# Patient Record
Sex: Female | Born: 1955 | Race: White | Hispanic: No | Smoking: Never smoker
Health system: Southern US, Community
[De-identification: ages and names within clinical notes are randomized; demographics above are authoritative.]

## PROBLEM LIST (undated history)

## (undated) DIAGNOSIS — K565 Intestinal adhesions [bands], unspecified as to partial versus complete obstruction: Secondary | ICD-10-CM

## (undated) DIAGNOSIS — C539 Malignant neoplasm of cervix uteri, unspecified: Secondary | ICD-10-CM

## (undated) DIAGNOSIS — E039 Hypothyroidism, unspecified: Secondary | ICD-10-CM

## (undated) DIAGNOSIS — R112 Nausea with vomiting, unspecified: Secondary | ICD-10-CM

## (undated) DIAGNOSIS — I10 Essential (primary) hypertension: Secondary | ICD-10-CM

## (undated) DIAGNOSIS — Z9889 Other specified postprocedural states: Secondary | ICD-10-CM

## (undated) DIAGNOSIS — F32A Depression, unspecified: Secondary | ICD-10-CM

## (undated) DIAGNOSIS — K5792 Diverticulitis of intestine, part unspecified, without perforation or abscess without bleeding: Secondary | ICD-10-CM

## (undated) DIAGNOSIS — K219 Gastro-esophageal reflux disease without esophagitis: Secondary | ICD-10-CM

## (undated) DIAGNOSIS — K7689 Other specified diseases of liver: Secondary | ICD-10-CM

## (undated) DIAGNOSIS — S3992XA Unspecified injury of lower back, initial encounter: Secondary | ICD-10-CM

## (undated) DIAGNOSIS — M1990 Unspecified osteoarthritis, unspecified site: Secondary | ICD-10-CM

## (undated) DIAGNOSIS — F419 Anxiety disorder, unspecified: Secondary | ICD-10-CM

## (undated) DIAGNOSIS — B029 Zoster without complications: Secondary | ICD-10-CM

## (undated) DIAGNOSIS — J45909 Unspecified asthma, uncomplicated: Secondary | ICD-10-CM

## (undated) DIAGNOSIS — Z780 Asymptomatic menopausal state: Secondary | ICD-10-CM

## (undated) DIAGNOSIS — K819 Cholecystitis, unspecified: Secondary | ICD-10-CM

## (undated) DIAGNOSIS — K859 Acute pancreatitis without necrosis or infection, unspecified: Secondary | ICD-10-CM

## (undated) DIAGNOSIS — Z96649 Presence of unspecified artificial hip joint: Secondary | ICD-10-CM

## (undated) DIAGNOSIS — C801 Malignant (primary) neoplasm, unspecified: Secondary | ICD-10-CM

## (undated) HISTORY — DX: Gastro-esophageal reflux disease without esophagitis: K21.9

## (undated) HISTORY — PX: SHOULDER SURGERY: SHX246

## (undated) HISTORY — PX: AUGMENTATION MAMMAPLASTY: SUR837

## (undated) HISTORY — DX: Hypothyroidism, unspecified: E03.9

## (undated) HISTORY — DX: Cholecystitis, unspecified: K81.9

## (undated) HISTORY — PX: OOPHORECTOMY: SHX86

## (undated) HISTORY — PX: ABDOMINAL HYSTERECTOMY: SHX81

## (undated) HISTORY — PX: TUBAL LIGATION: SHX77

## (undated) HISTORY — DX: Asymptomatic menopausal state: Z78.0

## (undated) HISTORY — PX: TONSILLECTOMY: SUR1361

## (undated) HISTORY — PX: COLONOSCOPY: SHX174

## (undated) HISTORY — DX: Unspecified injury of lower back, initial encounter: S39.92XA

## (undated) HISTORY — DX: Acute pancreatitis without necrosis or infection, unspecified: K85.90

## (undated) HISTORY — PX: TOTAL HIP ARTHROPLASTY: SHX124

## (undated) HISTORY — DX: Other specified diseases of liver: K76.89

## (undated) HISTORY — PX: CERVICAL SPINE SURGERY: SHX589

## (undated) HISTORY — DX: Presence of unspecified artificial hip joint: Z96.649

## (undated) HISTORY — DX: Intestinal adhesions (bands), unspecified as to partial versus complete obstruction: K56.50

---
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
## 1993-05-10 HISTORY — PX: APPENDECTOMY: SHX54

## 2001-05-10 HISTORY — PX: THYROIDECTOMY: SHX17

## 2007-11-28 ENCOUNTER — Encounter: Admission: RE | Admit: 2007-11-28 | Discharge: 2007-11-28 | Payer: Self-pay | Admitting: Family Medicine

## 2007-12-04 ENCOUNTER — Encounter: Admission: RE | Admit: 2007-12-04 | Discharge: 2007-12-04 | Payer: Self-pay | Admitting: Family Medicine

## 2008-08-15 ENCOUNTER — Encounter: Admission: RE | Admit: 2008-08-15 | Discharge: 2008-08-15 | Payer: Self-pay | Admitting: Family Medicine

## 2010-05-31 ENCOUNTER — Encounter: Payer: Self-pay | Admitting: Family Medicine

## 2010-07-23 ENCOUNTER — Other Ambulatory Visit: Payer: Self-pay | Admitting: Family Medicine

## 2010-07-24 ENCOUNTER — Ambulatory Visit
Admission: RE | Admit: 2010-07-24 | Discharge: 2010-07-24 | Disposition: A | Discharge status: Home / Self Care | Payer: Managed Care, Other (non HMO) | Source: Ambulatory Visit | Attending: Family Medicine | Admitting: Family Medicine

## 2010-07-24 DIAGNOSIS — K7689 Other specified diseases of liver: Secondary | ICD-10-CM

## 2010-07-24 HISTORY — DX: Other specified diseases of liver: K76.89

## 2010-10-09 ENCOUNTER — Other Ambulatory Visit: Payer: Self-pay | Admitting: Obstetrics and Gynecology

## 2010-10-09 DIAGNOSIS — N6320 Unspecified lump in the left breast, unspecified quadrant: Secondary | ICD-10-CM

## 2010-10-19 ENCOUNTER — Other Ambulatory Visit: Payer: Self-pay | Admitting: Obstetrics and Gynecology

## 2010-10-19 ENCOUNTER — Ambulatory Visit
Admission: RE | Admit: 2010-10-19 | Discharge: 2010-10-19 | Disposition: A | Discharge status: Home / Self Care | Payer: Managed Care, Other (non HMO) | Source: Ambulatory Visit | Attending: Obstetrics and Gynecology | Admitting: Obstetrics and Gynecology

## 2010-10-19 DIAGNOSIS — N6320 Unspecified lump in the left breast, unspecified quadrant: Secondary | ICD-10-CM

## 2011-01-15 ENCOUNTER — Ambulatory Visit: Payer: Managed Care, Other (non HMO) | Admitting: Nurse Practitioner

## 2011-01-15 ENCOUNTER — Telehealth: Payer: Self-pay | Admitting: Internal Medicine

## 2011-01-15 ENCOUNTER — Encounter: Payer: Self-pay | Admitting: *Deleted

## 2011-01-15 NOTE — Telephone Encounter (Signed)
Left a message for patient to call me. 

## 2011-01-15 NOTE — Telephone Encounter (Signed)
Spoke with patient and offered her an appointment with and extender. She does not want to do this. States she will wait for Dr. Juanda Chance. Scheduled patient on 03/03/11 at 10:15 AM. Letter mailed

## 2011-03-03 ENCOUNTER — Ambulatory Visit (INDEPENDENT_AMBULATORY_CARE_PROVIDER_SITE_OTHER): Payer: Managed Care, Other (non HMO) | Admitting: Internal Medicine

## 2011-03-03 ENCOUNTER — Encounter: Payer: Self-pay | Admitting: Internal Medicine

## 2011-03-03 VITALS — BP 100/70 | HR 68 | Ht 62.0 in | Wt 131.6 lb

## 2011-03-03 DIAGNOSIS — R1013 Epigastric pain: Secondary | ICD-10-CM

## 2011-03-03 NOTE — Progress Notes (Signed)
[name] 07-Feb-1956 MRN [id number]   History of Present Illness:  This is a 55 year old white female with intermittent epigastric pain which may be very severe at times, lasting for several hours. It may be partly relieved by drinking cold water or taking Mylanta. She has started taking antioxidants called Photogreen ,made by [facility], which has been effective in complete relief of her abdominal pain. She has been on it now for 6 weeks with complete resolution of the epigastric pain. She still wants to know why she was having abdominal pain. She tried Nexium 40 mg a day for several weeks without improvement. She doesn't smoking,  Drinking  Alcohol, she drinks about 4 cups of coffee a day. She had recent colonoscopy  which was negative. She had upper abdominal ultrasound in March 2012 which showed small liver cyst and CT scan of the abdomen and July 2009 was normal.    Past Medical History  Diagnosis Date  . Hypothyroidism   . Menopause   . GERD (gastroesophageal reflux disease)   . 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   Past Surgical History  Procedure Date  . Thyroidectomy 2003  . Cesarean section 2001  . Appendectomy 1995  . Oophorectomy 1970's    left  . Abdominal adhesion surgery 1972    reports that she has never smoked. She has never used smokeless tobacco. She reports that she does not drink alcohol or use illicit drugs. family history includes Alcohol abuse in her father; Colitis in her father; Lung cancer in her father; and Stroke in her mother.  There is no history of Colon cancer. Allergies  Allergen Reactions  . Percocet (Oxycodone-Acetaminophen)   . Sulfa Antibiotics         Review of the night heartburn, dysphagia odynophagia chest pain  The remainder of the 10 point ROS is negative except as outlined in H&P   Physical Exam: General appearance  Well developed, in no distress. Eyes- non icteric. HEENT nontraumatic, normocephalic. Mouth no lesions, tongue papillated, no  cheilosis. Neck supple without adenopathy, thyroid not enlarged, no carotid bruits, no JVD. Lungs Clear to auscultation bilaterally. Cor normal S1, normal S2, regular rhythm, no murmur,  quiet precordium. Abdomen: Soft scaphoid abdomen with normal active bowel sounds and mild tenderness in epigastrium there is no CVA tenderness. Liver edge is at the costal margin. Rectal: Not done. Extremities no pedal edema. Skin no lesions. Neurological alert and oriented x 3. Psychological normal mood and affect.  Assessment and Plan:  Problem #1 Intermittent epigastric pain sounds peptic. It is not relieved by Nexium but relieved by Mylanta and antioxidants. I am not sure of the relationship between antioxidants and gastritis. She would like to proceed with a diagnostic upper endoscopy to rule out H. pylori, hiatal hernia or peptic ulcer disease. I have asked her to reduce her caffeine intake.  03/03/2011 [name]

## 2011-03-03 NOTE — Patient Instructions (Addendum)
You have been scheduled for an endoscopy without sedation. Please follow written instructions given to you at your visit today.  CC: Dr Warrick Parisian

## 2011-03-04 ENCOUNTER — Encounter: Payer: Self-pay | Admitting: Internal Medicine

## 2011-03-18 ENCOUNTER — Other Ambulatory Visit: Payer: Managed Care, Other (non HMO) | Admitting: Internal Medicine

## 2011-03-24 ENCOUNTER — Other Ambulatory Visit: Payer: Managed Care, Other (non HMO) | Admitting: Internal Medicine

## 2011-08-10 DIAGNOSIS — M19049 Primary osteoarthritis, unspecified hand: Secondary | ICD-10-CM | POA: Insufficient documentation

## 2012-02-23 ENCOUNTER — Other Ambulatory Visit: Payer: Self-pay | Admitting: Orthopedic Surgery

## 2012-02-24 ENCOUNTER — Encounter (HOSPITAL_BASED_OUTPATIENT_CLINIC_OR_DEPARTMENT_OTHER): Payer: Self-pay | Admitting: *Deleted

## 2012-02-24 NOTE — Progress Notes (Signed)
Working-will need ekg and bmet May try to come today-or do in am

## 2012-02-24 NOTE — H&P (Signed)
  HPI: Patient presents with a chief complaint of left knee pain and back pain.  The left knee pain began a few months ago when she was reaching forward in a lunge position.  She localizes his pain to the medial side and complains of associated clicking.  She had right knee arthroscopy a couple of years ago for a medial meniscal tear and reports that her symptoms feel the same.  She has tried oral anti-inflammatories with minimal improvement.  She has had several cortisone injections but had bleeding for approximately 3 months afterwards and was advised not to use cortisone anymore. Her back pain began approximately one month ago and has associated popping.  She sustained a T11 compression fracture in 2006 and has known degenerative disc disease and back.  She localizes her pain to the left side of her mid back.  She denies any numbness, tingling or burning in her upper or lower extremities.  All: Percocet, morphine, anesthesia  ROS: 14 point review of systems form filled out by the patient was reviewed and was negative as it relates to the history of present illness except for: Glasses  PMH: Status post tonsillectomy, adenoidectomy, appendectomy, bilateral knee surgeries   FHx: Arthritis  SocHx:  She denies use of tobacco but drinks occasional alcohol.  She works full-time for [employer] company  PE: Well-developed, well-nourished 56 year old female who is alert and oriented and in no acute distress.  She is 5 feet 1 inch tall and weighs 130 pounds.  Exam of the left knee demonstrates tenderness to palpation along the medial joint line and pain with full extension.  McMurray's test is negative.  There is no obvious effusion. Exam of the back demonstrates tenderness to palpation just inferior to the scapula on the left side.  No palpable popping or swelling on exam.  She is neurovascularly intact.  Imaging/Tests: 2 views of the thoracic spine demonstrate an old compression  fracture at T11 multilevel degenerative disc disease.  She has scoliosis.  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 #2 thoracic back pain  Plan:  We have given [name] a prescription for formal physical therapy for the back.  She would like to proceed with a left knee arthroscopy.  Risks and benefits of surgery were discussed and are well known to the patient.  We also discussed a DEXA scan, which she will schedule at her convenience.

## 2012-02-25 ENCOUNTER — Encounter (HOSPITAL_BASED_OUTPATIENT_CLINIC_OR_DEPARTMENT_OTHER)
Admission: RE | Disposition: A | Discharge status: Home / Self Care | Payer: Self-pay | Source: Ambulatory Visit | Attending: Orthopedic Surgery

## 2012-02-25 ENCOUNTER — Encounter (HOSPITAL_BASED_OUTPATIENT_CLINIC_OR_DEPARTMENT_OTHER): Payer: Self-pay | Admitting: Anesthesiology

## 2012-02-25 ENCOUNTER — Encounter (HOSPITAL_BASED_OUTPATIENT_CLINIC_OR_DEPARTMENT_OTHER): Payer: Self-pay

## 2012-02-25 ENCOUNTER — Ambulatory Visit (HOSPITAL_BASED_OUTPATIENT_CLINIC_OR_DEPARTMENT_OTHER): Payer: Managed Care, Other (non HMO) | Admitting: Anesthesiology

## 2012-02-25 ENCOUNTER — Ambulatory Visit (HOSPITAL_BASED_OUTPATIENT_CLINIC_OR_DEPARTMENT_OTHER)
Admission: RE | Admit: 2012-02-25 | Discharge: 2012-02-25 | Disposition: A | Discharge status: Home / Self Care | Payer: Managed Care, Other (non HMO) | Source: Ambulatory Visit | Attending: Orthopedic Surgery | Admitting: Orthopedic Surgery

## 2012-02-25 DIAGNOSIS — M23305 Other meniscus derangements, unspecified medial meniscus, unspecified knee: Secondary | ICD-10-CM | POA: Insufficient documentation

## 2012-02-25 DIAGNOSIS — M2240 Chondromalacia patellae, unspecified knee: Secondary | ICD-10-CM | POA: Insufficient documentation

## 2012-02-25 DIAGNOSIS — S83209A Unspecified tear of unspecified meniscus, current injury, unspecified knee, initial encounter: Secondary | ICD-10-CM

## 2012-02-25 HISTORY — DX: Other specified postprocedural states: Z98.890

## 2012-02-25 HISTORY — DX: Other specified postprocedural states: R11.2

## 2012-02-25 LAB — POCT I-STAT, CHEM 8
Calcium, Ion: 1.02 mmol/L — ABNORMAL LOW (ref 1.12–1.23)
Chloride: 102 mEq/L (ref 96–112)
Creatinine, Ser: 0.9 mg/dL (ref 0.50–1.10)
Glucose, Bld: 89 mg/dL (ref 70–99)
Potassium: 3.5 mEq/L (ref 3.5–5.1)

## 2012-02-25 SURGERY — ARTHROSCOPY, KNEE, WITH MEDIAL MENISCECTOMY
Anesthesia: General | Site: Knee | Laterality: Left | Wound class: Clean

## 2012-02-25 MED ORDER — CEFAZOLIN SODIUM-DEXTROSE 2-3 GM-% IV SOLR
2.0000 g | INTRAVENOUS | Status: AC
  Administered 2012-02-25: 2 g via INTRAVENOUS

## 2012-02-25 MED ORDER — DEXAMETHASONE SODIUM PHOSPHATE 4 MG/ML IJ SOLN
INTRAMUSCULAR | Status: DC | PRN
  Administered 2012-02-25: 10 mg via INTRAVENOUS

## 2012-02-25 MED ORDER — SODIUM CHLORIDE 0.9 % IR SOLN: Status: DC | PRN

## 2012-02-25 MED ORDER — FENTANYL CITRATE 0.05 MG/ML IJ SOLN
INTRAMUSCULAR | Status: DC | PRN
  Administered 2012-02-25 (×2): 50 ug via INTRAVENOUS

## 2012-02-25 MED ORDER — PROPOFOL INFUSION 10 MG/ML OPTIME
INTRAVENOUS | Status: DC | PRN
  Administered 2012-02-25: 180 ug/kg/min via INTRAVENOUS

## 2012-02-25 MED ORDER — SODIUM CHLORIDE 0.9 % IR SOLN
Status: DC | PRN
  Administered 2012-02-25: 09:00:00

## 2012-02-25 MED ORDER — CHLORHEXIDINE GLUCONATE 4 % EX LIQD: 60.0000 mL | Freq: Once | CUTANEOUS | Status: DC

## 2012-02-25 MED ORDER — SCOPOLAMINE 1 MG/3DAYS TD PT72
1.0000 | MEDICATED_PATCH | Freq: Once | TRANSDERMAL | Status: DC
  Administered 2012-02-25: 1.5 mg via TRANSDERMAL

## 2012-02-25 MED ORDER — PHENYLEPHRINE HCL 10 MG/ML IJ SOLN
INTRAMUSCULAR | Status: DC | PRN
  Administered 2012-02-25: 40 ug via INTRAVENOUS

## 2012-02-25 MED ORDER — FENTANYL CITRATE 0.05 MG/ML IJ SOLN: INTRAMUSCULAR | Status: DC | PRN

## 2012-02-25 MED ORDER — SCOPOLAMINE 1 MG/3DAYS TD PT72
MEDICATED_PATCH | TRANSDERMAL | Status: DC | PRN
  Administered 2012-02-25: 1 via TRANSDERMAL

## 2012-02-25 MED ORDER — LACTATED RINGERS IV SOLN
INTRAVENOUS | Status: DC
  Administered 2012-02-25 (×2): via INTRAVENOUS

## 2012-02-25 MED ORDER — LIDOCAINE HCL (CARDIAC) 20 MG/ML IV SOLN
INTRAVENOUS | Status: DC | PRN
  Administered 2012-02-25: 75 mg via INTRAVENOUS

## 2012-02-25 MED ORDER — PROPOFOL 10 MG/ML IV BOLUS
INTRAVENOUS | Status: DC | PRN
  Administered 2012-02-25: 200 mg via INTRAVENOUS

## 2012-02-25 MED ORDER — ACETAMINOPHEN 10 MG/ML IV SOLN
1000.0000 mg | Freq: Once | INTRAVENOUS | Status: AC
  Administered 2012-02-25: 1000 mg via INTRAVENOUS

## 2012-02-25 MED ORDER — DEXTROSE-NACL 5-0.45 % IV SOLN: INTRAVENOUS | Status: DC

## 2012-02-25 MED ORDER — BUPIVACAINE-EPINEPHRINE 0.5% -1:200000 IJ SOLN
INTRAMUSCULAR | Status: DC | PRN
  Administered 2012-02-25: 20 mL

## 2012-02-25 MED ORDER — MIDAZOLAM HCL 5 MG/5ML IJ SOLN
INTRAMUSCULAR | Status: DC | PRN
  Administered 2012-02-25: 2 mg via INTRAVENOUS

## 2012-02-25 MED ORDER — TRAMADOL HCL 50 MG PO TABS: 50.0000 mg | ORAL_TABLET | ORAL | Status: DC | PRN

## 2012-02-25 MED ORDER — ACETAMINOPHEN 10 MG/ML IV SOLN
INTRAVENOUS | Status: DC | PRN
  Administered 2012-02-25: 1000 mg via INTRAVENOUS

## 2012-02-25 MED ORDER — ONDANSETRON HCL 4 MG/2ML IJ SOLN
INTRAMUSCULAR | Status: DC | PRN
  Administered 2012-02-25: 4 mg via INTRAVENOUS

## 2012-02-25 MED ORDER — MIDAZOLAM HCL 5 MG/5ML IJ SOLN: INTRAMUSCULAR | Status: DC | PRN

## 2012-02-25 SURGICAL SUPPLY — 38 items
BANDAGE ELASTIC 6 VELCRO ST LF (GAUZE/BANDAGES/DRESSINGS) ×2 IMPLANT
BLADE 4.2CUDA (BLADE) IMPLANT
BLADE CUTTER GATOR 3.5 (BLADE) ×2 IMPLANT
BLADE GREAT WHITE 4.2 (BLADE) IMPLANT
CANISTER OMNI JUG 16 LITER (MISCELLANEOUS) ×2 IMPLANT
CANISTER SUCTION 2500CC (MISCELLANEOUS) IMPLANT
CHLORAPREP W/TINT 26ML (MISCELLANEOUS) ×2 IMPLANT
CLOTH BEACON ORANGE TIMEOUT ST (SAFETY) ×2 IMPLANT
DRAPE ARTHROSCOPY W/POUCH 114 (DRAPES) ×2 IMPLANT
ELECT MENISCUS 165MM 90D (ELECTRODE) IMPLANT
ELECT REM PT RETURN 9FT ADLT (ELECTROSURGICAL)
ELECTRODE REM PT RTRN 9FT ADLT (ELECTROSURGICAL) IMPLANT
GAUZE XEROFORM 1X8 LF (GAUZE/BANDAGES/DRESSINGS) ×2 IMPLANT
GLOVE BIO SURGEON STRL SZ7 (GLOVE) ×2 IMPLANT
GLOVE BIO SURGEON STRL SZ7.5 (GLOVE) ×2 IMPLANT
GLOVE BIOGEL PI IND STRL 7.0 (GLOVE) ×2 IMPLANT
GLOVE BIOGEL PI IND STRL 8 (GLOVE) ×1 IMPLANT
GLOVE BIOGEL PI INDICATOR 7.0 (GLOVE) ×2
GLOVE BIOGEL PI INDICATOR 8 (GLOVE) ×1
GLOVE ECLIPSE 6.5 STRL STRAW (GLOVE) ×2 IMPLANT
GOWN PREVENTION PLUS XLARGE (GOWN DISPOSABLE) ×4 IMPLANT
KNEE WRAP E Z 3 GEL PACK (MISCELLANEOUS) ×2 IMPLANT
NDL SAFETY ECLIPSE 18X1.5 (NEEDLE) ×1 IMPLANT
NEEDLE FILTER BLUNT 18X 1/2SAF (NEEDLE)
NEEDLE FILTER BLUNT 18X1 1/2 (NEEDLE) IMPLANT
NEEDLE HYPO 18GX1.5 SHARP (NEEDLE) ×1
PACK ARTHROSCOPY DSU (CUSTOM PROCEDURE TRAY) ×2 IMPLANT
PACK BASIN DAY SURGERY FS (CUSTOM PROCEDURE TRAY) ×2 IMPLANT
PENCIL BUTTON HOLSTER BLD 10FT (ELECTRODE) IMPLANT
SET ARTHROSCOPY TUBING (MISCELLANEOUS) ×1
SET ARTHROSCOPY TUBING LN (MISCELLANEOUS) ×1 IMPLANT
SLEEVE SCD COMPRESS KNEE MED (MISCELLANEOUS) IMPLANT
SPONGE GAUZE 4X4 12PLY (GAUZE/BANDAGES/DRESSINGS) ×2 IMPLANT
SYR 3ML 18GX1 1/2 (SYRINGE) ×2 IMPLANT
SYR 5ML LL (SYRINGE) ×2 IMPLANT
TOWEL OR 17X24 6PK STRL BLUE (TOWEL DISPOSABLE) ×2 IMPLANT
WAND STAR VAC 90 (SURGICAL WAND) IMPLANT
WATER STERILE IRR 1000ML POUR (IV SOLUTION) ×2 IMPLANT

## 2012-02-25 NOTE — Anesthesia Postprocedure Evaluation (Signed)
Anesthesia Post Note  Patient: [name]  Procedure(s) Performed: Procedure(s) (LRB): KNEE ARTHROSCOPY WITH MEDIAL MENISECTOMY (Left)  Anesthesia type: General  Patient location: PACU  Post pain: Pain level controlled and Adequate analgesia  Post assessment: Post-op Vital signs reviewed, Patient's Cardiovascular Status Stable, Respiratory Function Stable, Patent Airway and Pain level controlled  Last Vitals:  Filed Vitals:   02/25/12 1015  BP:   Pulse: 76  Temp:   Resp: 18    Post vital signs: Reviewed and stable  Level of consciousness: awake, alert  and oriented  Complications: No apparent anesthesia complications

## 2012-02-25 NOTE — Anesthesia Preprocedure Evaluation (Signed)
Anesthesia Evaluation  Patient identified by MRN, date of birth, ID band Patient awake    Reviewed: Allergy & Precautions, H&P , NPO status , Patient's Chart, lab work & pertinent test results  History of Anesthesia Complications (+) PONV  Airway Mallampati: II  Neck ROM: full    Dental   Pulmonary          Cardiovascular     Neuro/Psych    GI/Hepatic GERD-  ,  Endo/Other  Hypothyroidism   Renal/GU      Musculoskeletal   Abdominal   Peds  Hematology   Anesthesia Other Findings   Reproductive/Obstetrics                           Anesthesia Physical Anesthesia Plan  ASA: II  Anesthesia Plan: General   Post-op Pain Management:    Induction: Intravenous  Airway Management Planned: LMA  Additional Equipment:   Intra-op Plan:   Post-operative Plan:   Informed Consent: I have reviewed the patients History and Physical, chart, labs and discussed the procedure including the risks, benefits and alternatives for the proposed anesthesia with the patient or authorized representative who has indicated his/her understanding and acceptance.     Plan Discussed with: CRNA and Surgeon  Anesthesia Plan Comments:         Anesthesia Quick Evaluation

## 2012-02-25 NOTE — Anesthesia Procedure Notes (Signed)
Procedure Name: LMA Insertion Date/Time: 02/25/2012 9:05 AM Performed by: Zenia Resides D Pre-anesthesia Checklist: Patient identified, Emergency Drugs available, Suction available, Patient being monitored and Timeout performed Patient Re-evaluated:Patient Re-evaluated prior to inductionOxygen Delivery Method: Circle System Utilized Preoxygenation: Pre-oxygenation with 100% oxygen Intubation Type: IV induction Ventilation: Mask ventilation without difficulty LMA: LMA inserted LMA Size: 4.0 Number of attempts: 1 Airway Equipment and Method: bite block Placement Confirmation: positive ETCO2 and breath sounds checked- equal and bilateral Tube secured with: Tape Dental Injury: Teeth and Oropharynx as per pre-operative assessment

## 2012-02-25 NOTE — Op Note (Signed)
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  Surgeon: Feliberto Gottron. Turner Daniels M.D.  Assist: Shirl Harris PA-C  Anes: General LMA  EBL: Minimal  Fluids: 800 cc   Indications: Greater than three-month history of catching popping and pain to the medial aspect of the left knee. Has occasional near locking episodes and sometimes the pain wakes her at night when she is in bed.. Pt has failed conservative treatment with anti-inflammatory medicines, physical therapy, and modified activites but did get good temporarily from an intra-articular cortisone injection. Pain has recurred and patient desires elective arthroscopic evaluation and treatment of knee. Risks and benefits of surgery have been discussed and questions answered.  Procedure: Patient identified by arm band and taken to the operating room at the day surgery Center. The appropriate anesthetic monitors were attached, and General LMA anesthesia was induced without difficulty. Lateral post was applied to the table and the lower extremity was prepped and draped in usual sterile fashion from the ankle to the midthigh. Time out procedure was performed. We began the operation by making standard inferior lateral and inferior medial peripatellar portals with a #11 blade allowing introduction of the arthroscope through the inferior lateral portal and the out flow to the inferior medial portal. Pump pressure was set at 100 mmHg and diagnostic arthroscopy  revealed focal grade 4 chondromalacia to the lateral facet of the patella, grade 3 chondromalacia with flap tears and focal grade 4 chondromalacia the trochlea and the anterior flange of the medial femoral condyle. These areas are debridement back to a stable  margin with a straight biter large and small and a 35 Gator sucker shaver. Moving into the medial compartment the patient complex degenerative tearing of the medial meniscus is debrider back to a stable margin again with a 35 Gator sucker shaver the anterior cruciate ligament and PCL were noted to be intact. The lateral compartment was in excellent condition with normal-appearing articular and meniscal cartilages. The gutters were cleared medially and laterally. The knee was irrigated out normal saline solution. A dressing of xerofoam 4 x 4 dressing sponges, web roll and an Ace wrap was applied. The patient was awakened extubated and taken to the recovery without difficulty.    Signed: Nestor Lewandowsky, MD

## 2012-02-25 NOTE — Interval H&P Note (Signed)
History and Physical Interval Note:  02/25/2012 8:35 AM  [name]  has presented today for surgery, with the diagnosis of LEFT KNEE MEDIAL MENSICAL TEAR AND CHONDROMALACIA  The various methods of treatment have been discussed with the patient and family. After consideration of risks, benefits and other options for treatment, the patient has consented to  Procedure(s) (LRB) with comments: KNEE ARTHROSCOPY WITH MEDIAL MENISECTOMY (Left) - LEFT KNEE ARTHROSCOPY WITH PARTIAL MEDIAL MENISECTOMY, DEBRIDEMENT OF CHONDROMALACIA as a surgical intervention .  The patient's history has been reviewed, patient examined, no change in status, stable for surgery.  I have reviewed the patient's chart and labs.  Questions were answered to the patient's satisfaction.     Nestor Lewandowsky

## 2012-02-25 NOTE — Transfer of Care (Signed)
Immediate Anesthesia Transfer of Care Note  Patient: [name]  Procedure(s) Performed: Procedure(s) (LRB) with comments: KNEE ARTHROSCOPY WITH MEDIAL MENISECTOMY (Left) - LEFT KNEE ARTHROSCOPY WITH PARTIAL MEDIAL MENISECTOMY, DEBRIDEMENT OF CHONDROMALACIA  Patient Location: PACU  Anesthesia Type: General  Level of Consciousness: awake  Airway & Oxygen Therapy: Patient Spontanous Breathing and Patient connected to face mask oxygen  Post-op Assessment: Report given to PACU RN and Post -op Vital signs reviewed and stable  Post vital signs: Reviewed and stable  Complications: No apparent anesthesia complications

## 2012-09-04 ENCOUNTER — Ambulatory Visit: Payer: BC Managed Care – PPO

## 2012-09-04 ENCOUNTER — Ambulatory Visit (INDEPENDENT_AMBULATORY_CARE_PROVIDER_SITE_OTHER): Payer: BC Managed Care – PPO | Admitting: Family Medicine

## 2012-09-04 VITALS — BP 118/78 | HR 83 | Temp 98.5°F | Resp 16 | Ht 62.0 in | Wt 140.4 lb

## 2012-09-04 DIAGNOSIS — S8990XA Unspecified injury of unspecified lower leg, initial encounter: Secondary | ICD-10-CM

## 2012-09-04 DIAGNOSIS — S99912A Unspecified injury of left ankle, initial encounter: Secondary | ICD-10-CM

## 2012-09-04 DIAGNOSIS — S99929A Unspecified injury of unspecified foot, initial encounter: Secondary | ICD-10-CM

## 2012-09-04 NOTE — Progress Notes (Signed)
[facility] [address], [place] [address] [phone]  Date:  09/04/2012   Name:  [name]   DOB:  02/24/1956   MRN:  [id number]  PCP:  Warrick Parisian, MD    Chief Complaint: left ankle pain   History of Present Illness:  [name] is a 57 y.o. very pleasant female patient who presents with the following:  She is here today to evaluate a left foot injury which occurred on Saturday- today is Monday. She had to stop suddenly while on a zip line and pushed off a tree with the left foot.   She had immediate pain, and could not walk on the ankle.  She is now able to walk but is still having pain, as well as bruising and swelling. She had some crutches at home which she is using, but she lost the underarm pad from one crutch so now cannot use them.   She is generally healthy She is through menopause, no chance of pregnancy per her report  There is no problem list on file for this patient.   Past Medical History  Diagnosis Date  . Hypothyroidism   . Menopause   . GERD (gastroesophageal reflux disease)   . 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 PONV (postoperative nausea and vomiting)     Past Surgical History  Procedure Laterality Date  . Thyroidectomy  2003  . Cesarean section  2001  . Appendectomy  1995  . Oophorectomy  1970's    left  . Abdominal adhesion surgery  1972  . Tonsillectomy    . Colonoscopy      History  Substance Use Topics  . Smoking status: Never Smoker   . Smokeless tobacco: Never Used  . Alcohol Use: No     Comment: wine socially    Family History  Problem Relation Age of Onset  . Lung cancer Father   . Colitis Father   . Alcohol abuse Father   . Stroke Mother   . Colon cancer Neg Hx     Allergies  Allergen Reactions  . Codeine Hives  . Hydrocodone Nausea And Vomiting  . Percocet (Oxycodone-Acetaminophen)   . Prednisone     Causes her to have vaginal bleeding  . Sulfa Antibiotics     Medication list  has been reviewed and updated.  Current Outpatient Prescriptions on File Prior to Visit  Medication Sig Dispense Refill  . estradiol (VIVELLE-DOT) 0.025 MG/24HR Place 1 patch onto the skin 2 (two) times a week.      . losartan (COZAAR) 50 MG tablet Take 100 mg by mouth daily. Take half tablet(50mg  daily)      . thyroid (ARMOUR) 120 MG tablet Take 120 mg by mouth daily.      Marland Kitchen esomeprazole (NEXIUM) 40 MG capsule Take 40 mg by mouth daily before breakfast.      . psyllium (METAMUCIL) 58.6 % packet Take 1 packet by mouth daily.        . traMADol (ULTRAM) 50 MG tablet Take 1-2 tablets (50-100 mg total) by mouth every 4 (four) hours as needed for pain.  60 tablet  0   No current facility-administered medications on file prior to visit.    Review of Systems:  As per HPI- otherwise negative.    Physical Examination: Filed Vitals:   09/04/12 1013  BP: 118/78  Pulse: 83  Temp: 98.5 F (36.9 C)  Resp: 16   Filed Vitals:   09/04/12 1013  Height: 5\' 2"  (1.575 m)  Weight: 140 lb 6.4 oz (63.685 kg)   Body mass index is 25.67 kg/(m^2). Ideal Body Weight: Weight in (lb) to have BMI = 25: 136.4  GEN: WDWN, NAD, Non-toxic, A & O x 3, looks well HEENT: Atraumatic, Normocephalic. Neck supple. No masses, No LAD. Ears and Nose: No external deformity. CV: RRR, No M/G/R. No JVD. No thrill. No extra heart sounds. PULM: CTA B, no wheezes, crackles, rhonchi. No retractions. No resp. distress. No accessory muscle use. EXTR: No c/c/e NEURO favoring her left foot PSYCH: Normally interactive. Conversant. Not depressed or anxious appearing.  Calm demeanor.  Left ankle: she is tender and bruised medially, tender at medial and lateral ankle. Achilles intact, tender in the medial heel, but no 5th MT tenderness.  Able to flex and extend ankle, normal sensation and perfusion of toes  [facility] reading (PRIMARY) by  Dr. Patsy Lager. Left ankle: negative Left foot: negative  LEFT ANKLE COMPLETE - 3+  VIEW  Comparison: Left foot radiographs obtained at the same time.  Findings: The lateral view of the ankle is included with the foot radiographs. There is diffuse soft tissue swelling. No fracture, dislocation or effusion seen. Accessory ossicle posterior to the posterior subtalar joint.  IMPRESSION: Diffuse soft tissue swelling without fracture.   LEFT FOOT - COMPLETE 3+ VIEW  Comparison: The left ankle radiographs obtained at the same time.  Findings: Normal appearing bones and soft tissues without fracture or dislocation.  IMPRESSION: Normal examination.  Clinically significant discrepancy from primary report, if provided: No preliminary report given.   Assessment and Plan: Ankle injury, left, initial encounter - Plan: DG Ankle Complete Left, DG Foot Complete Left  Left ankle pain and injury.  Will place in a short CAM and replace her crutches.  No apparent fracture, seems to have a contusion and sprain.  However, cautioned her to follow- up if her pain persists.   She has some ultram at home to use as needed for pain.   See patient instructions for more details.    Called and LMOM- over- read report also negative   Signed Abbe Amsterdam, MD

## 2012-09-04 NOTE — Patient Instructions (Addendum)
Ice and elevate your ankle, and wear you boot/ use crutches to keep weight off your ankle. I will be in touch with your x-ray overread report.   If your ankle is not better in the next 7- 10 days please let us know.

## 2012-09-29 ENCOUNTER — Other Ambulatory Visit: Payer: Self-pay | Admitting: Nurse Practitioner

## 2012-09-29 DIAGNOSIS — M25572 Pain in left ankle and joints of left foot: Secondary | ICD-10-CM

## 2012-10-04 ENCOUNTER — Other Ambulatory Visit: Payer: Managed Care, Other (non HMO)

## 2013-02-21 ENCOUNTER — Ambulatory Visit (INDEPENDENT_AMBULATORY_CARE_PROVIDER_SITE_OTHER): Payer: BC Managed Care – PPO | Admitting: Psychiatry

## 2013-02-21 DIAGNOSIS — F4323 Adjustment disorder with mixed anxiety and depressed mood: Secondary | ICD-10-CM

## 2013-02-28 ENCOUNTER — Ambulatory Visit (INDEPENDENT_AMBULATORY_CARE_PROVIDER_SITE_OTHER): Payer: BC Managed Care – PPO | Admitting: Psychiatry

## 2013-02-28 DIAGNOSIS — Z630 Problems in relationship with spouse or partner: Secondary | ICD-10-CM

## 2013-02-28 DIAGNOSIS — F4323 Adjustment disorder with mixed anxiety and depressed mood: Secondary | ICD-10-CM

## 2013-03-13 ENCOUNTER — Ambulatory Visit (INDEPENDENT_AMBULATORY_CARE_PROVIDER_SITE_OTHER): Payer: BC Managed Care – PPO | Admitting: Psychiatry

## 2013-03-13 DIAGNOSIS — F4323 Adjustment disorder with mixed anxiety and depressed mood: Secondary | ICD-10-CM

## 2013-04-17 ENCOUNTER — Ambulatory Visit: Payer: BC Managed Care – PPO | Admitting: Psychiatry

## 2013-07-20 DIAGNOSIS — Z780 Asymptomatic menopausal state: Secondary | ICD-10-CM | POA: Insufficient documentation

## 2013-07-20 DIAGNOSIS — E039 Hypothyroidism, unspecified: Secondary | ICD-10-CM | POA: Insufficient documentation

## 2013-09-24 DIAGNOSIS — I10 Essential (primary) hypertension: Secondary | ICD-10-CM | POA: Insufficient documentation

## 2015-06-10 ENCOUNTER — Other Ambulatory Visit: Payer: Self-pay

## 2015-06-10 DIAGNOSIS — Z1231 Encounter for screening mammogram for malignant neoplasm of breast: Secondary | ICD-10-CM

## 2015-06-17 ENCOUNTER — Other Ambulatory Visit: Payer: Self-pay

## 2015-06-17 ENCOUNTER — Ambulatory Visit
Admission: RE | Admit: 2015-06-17 | Discharge: 2015-06-17 | Disposition: A | Discharge status: Home / Self Care | Payer: Managed Care, Other (non HMO) | Source: Ambulatory Visit

## 2015-06-17 DIAGNOSIS — Z1231 Encounter for screening mammogram for malignant neoplasm of breast: Secondary | ICD-10-CM

## 2015-10-16 ENCOUNTER — Other Ambulatory Visit: Payer: Self-pay | Admitting: *Deleted

## 2015-10-16 ENCOUNTER — Encounter: Payer: Self-pay | Admitting: Obstetrics & Gynecology

## 2015-10-16 ENCOUNTER — Ambulatory Visit (INDEPENDENT_AMBULATORY_CARE_PROVIDER_SITE_OTHER): Payer: Managed Care, Other (non HMO) | Admitting: Obstetrics & Gynecology

## 2015-10-16 VITALS — BP 120/70 | HR 84 | Resp 16 | Ht 62.0 in | Wt 143.0 lb

## 2015-10-16 DIAGNOSIS — N951 Menopausal and female climacteric states: Secondary | ICD-10-CM

## 2015-10-16 DIAGNOSIS — Z124 Encounter for screening for malignant neoplasm of cervix: Secondary | ICD-10-CM | POA: Diagnosis not present

## 2015-10-16 DIAGNOSIS — Z01419 Encounter for gynecological examination (general) (routine) without abnormal findings: Secondary | ICD-10-CM | POA: Diagnosis not present

## 2015-10-16 DIAGNOSIS — Z1151 Encounter for screening for human papillomavirus (HPV): Secondary | ICD-10-CM

## 2015-10-16 MED ORDER — ESTRADIOL 0.1 MG/24HR TD PTTW: 1.0000 | MEDICATED_PATCH | TRANSDERMAL | Status: DC

## 2015-10-16 MED ORDER — MEDROXYPROGESTERONE ACETATE 2.5 MG PO TABS: 2.5000 mg | ORAL_TABLET | Freq: Every day | ORAL | Status: DC

## 2015-10-16 MED ORDER — MEDROXYPROGESTERONE ACETATE 2.5 MG PO TABS
2.5000 mg | ORAL_TABLET | Freq: Every day | ORAL | Status: DC
Start: 2015-10-16 — End: 2015-11-10

## 2015-10-16 NOTE — Telephone Encounter (Signed)
Pt called back to office to have her meds changed to [facility] mail order pharmacy.  Vivelle and Provera sent to [facility] as ordered.

## 2015-10-16 NOTE — Progress Notes (Signed)
Subjective:    [name] is a 60 y.o. DW P3 (98, 48, and 40 yo kids)  female who presents for an annual exam. The patient has no complaints today. The patient is not currently sexually active. GYN screening history: last pap: was normal. The patient wears seatbelts: yes. The patient participates in regular exercise: yes. Has the patient ever been transfused or tattooed?: no. The patient reports that there is not domestic violence in her life.   Menstrual History: OB History    Gravida Para Term Preterm AB TAB SAB Ectopic Multiple Living   3 3 3       3       Menarche age: 89  No LMP recorded. Patient is postmenopausal.    The following portions of the patient's history were reviewed and updated as appropriate: allergies, current medications, past family history, past medical history, past social history, past surgical history and problem list.  Review of Systems Pertinent items are noted in HPI.  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 Mammogram UTD and normal She is a English as a second language teacher. Colonoscopy at age 17, normal. She stopped her HRT 8/16 due to cost and is considering starting it again.   Objective:    BP 120/70 mmHg  Pulse 84  Resp 16  Ht 5\' 2"  (1.575 m)  Wt 143 lb (64.864 kg)  BMI 26.15 kg/m2  General Appearance:    Alert, cooperative, no distress, appears stated age  Head:    Normocephalic, without obvious abnormality, atraumatic  Eyes:    PERRL, conjunctiva/corneas clear, EOM's intact, fundi    benign, both eyes  Ears:    Normal TM's and external ear canals, both ears  Nose:   Nares normal, septum midline, mucosa normal, no drainage    or sinus tenderness  Throat:   Lips, mucosa, and tongue normal; teeth and gums normal  Neck:   Supple, symmetrical, trachea midline, no adenopathy;    thyroid:  no enlargement/tenderness/nodules; no carotid   bruit or JVD  Back:     Symmetric, no curvature, ROM normal, no CVA tenderness  Lungs:     Clear to auscultation bilaterally, respirations unlabored   Chest Wall:    No tenderness or deformity   Heart:    Regular rate and rhythm, S1 and S2 normal, no murmur, rub   or gallop  Breast Exam:    No tenderness, masses, or nipple abnormality  Abdomen:     Soft, non-tender, bowel sounds active all four quadrants,    no masses, no organomegaly  Genitalia:    Normal female without lesion, discharge or tenderness, 8 week size, limited mobility, no palpable adnexal masses. Fairly severe vulvovaginal atrophy     Extremities:   Extremities normal, atraumatic, no cyanosis or edema  Pulses:   2+ and symmetric all extremities  Skin:   Skin color, texture, turgor normal, no rashes or lesions  Lymph nodes:   Cervical, supraclavicular, and axillary nodes normal  Neurologic:   CNII-XII intact, normal strength, sensation and reflexes    throughout  .    Assessment:    Healthy female exam.    Plan:     Thin prep Pap smear. with cotesting

## 2015-10-20 LAB — CYTOLOGY - PAP

## 2015-11-10 ENCOUNTER — Other Ambulatory Visit: Payer: Self-pay | Admitting: Obstetrics & Gynecology

## 2015-11-10 DIAGNOSIS — Z79890 Hormone replacement therapy (postmenopausal): Secondary | ICD-10-CM

## 2015-11-10 MED ORDER — PROGESTERONE MICRONIZED 100 MG PO CAPS: 100.0000 mg | ORAL_CAPSULE | Freq: Every day | ORAL | Status: DC

## 2015-11-10 NOTE — Progress Notes (Signed)
Patient called and requested progestin therapy change from Provera to Prometrium secondary to headaches.  She will try Prometrium 100 mg daily (prescribed) so see if her side effects improve.    Osborne Oman, MD

## 2015-11-18 ENCOUNTER — Telehealth: Payer: Self-pay | Admitting: *Deleted

## 2015-11-18 DIAGNOSIS — Z79890 Hormone replacement therapy (postmenopausal): Secondary | ICD-10-CM

## 2015-11-18 MED ORDER — NORETHINDRONE ACETATE 5 MG PO TABS: 5.0000 mg | ORAL_TABLET | Freq: Every day | ORAL | Status: DC

## 2015-11-18 NOTE — Telephone Encounter (Signed)
Pt c/o'd that her current RX of Provera was making her dizzy.  She stopped for a few days and went back to taking it and same dizziness.  She is convinced that the different drug maker of provera caused the dizziness.  Ie:  Additive.  She wants to switch her Progesterone to something different.  Spoke with Dr Hulan Fray who ordered Aygestin 5 mg daily.  Pt states that she wants to try 2.5 mg daily first.

## 2016-03-16 ENCOUNTER — Other Ambulatory Visit: Payer: Self-pay | Admitting: Obstetrics & Gynecology

## 2016-03-16 ENCOUNTER — Other Ambulatory Visit: Payer: Self-pay

## 2016-03-16 DIAGNOSIS — Z79890 Hormone replacement therapy (postmenopausal): Secondary | ICD-10-CM

## 2016-03-16 MED ORDER — NORETHINDRONE ACETATE 5 MG PO TABS: 5.0000 mg | ORAL_TABLET | Freq: Every day | ORAL | 1 refills | Status: DC

## 2016-03-25 ENCOUNTER — Encounter: Payer: Self-pay | Admitting: Obstetrics & Gynecology

## 2016-03-25 ENCOUNTER — Ambulatory Visit (INDEPENDENT_AMBULATORY_CARE_PROVIDER_SITE_OTHER): Payer: Managed Care, Other (non HMO) | Admitting: Obstetrics & Gynecology

## 2016-03-25 VITALS — BP 130/88 | Ht 61.0 in | Wt 133.0 lb

## 2016-03-25 DIAGNOSIS — N898 Other specified noninflammatory disorders of vagina: Secondary | ICD-10-CM

## 2016-03-25 DIAGNOSIS — Z0000 Encounter for general adult medical examination without abnormal findings: Secondary | ICD-10-CM

## 2016-03-25 NOTE — Progress Notes (Signed)
   Subjective:    Patient ID: BECCI HRISTOV, female    DOB: Mar 08, 1956, 60 y.o.   MRN: [id number]  HPI  60 yo DW P3 here to discuss her terrible vaginal dryness. She tried Vivelle dot with progesterone, prescribed in June 2017. It really helped the hot flashes but not the vaginal dryness.   Review of Systems She is having occasional sex with her ex husband, presumably monogamous.    Objective:   Physical Exam [id number] Breathing, conversing, and ambulating normally       Assessment & Plan:  V v a- She is interested in trying a vaginal preparation of estrogen. STI testing Declines flu vaccine

## 2016-03-26 ENCOUNTER — Other Ambulatory Visit: Payer: Managed Care, Other (non HMO)

## 2016-03-26 LAB — HEPATITIS C ANTIBODY: HCV AB: NEGATIVE

## 2016-03-26 LAB — HEPATITIS B SURFACE ANTIGEN: Hepatitis B Surface Ag: NEGATIVE

## 2016-03-26 LAB — HIV ANTIBODY (ROUTINE TESTING W REFLEX): HIV 1&2 Ab, 4th Generation: NONREACTIVE

## 2016-03-26 LAB — RPR

## 2016-04-15 ENCOUNTER — Telehealth: Payer: Self-pay | Admitting: *Deleted

## 2016-04-15 NOTE — Telephone Encounter (Signed)
Pt call stating that she was using the compounding Estrogen Biweekly.  She feels like the hot flashes are not any better and would like to use it 3 times weekly.  Spoke with Dr Hulan Fray who Ok'd her to use it 3x weekly.  Pt will let us know how that works for her and if she needs RF's she will call for a larger prescribed amount.

## 2016-06-13 DIAGNOSIS — M19012 Primary osteoarthritis, left shoulder: Secondary | ICD-10-CM | POA: Insufficient documentation

## 2016-06-22 ENCOUNTER — Other Ambulatory Visit: Payer: Self-pay | Admitting: Obstetrics & Gynecology

## 2016-06-22 DIAGNOSIS — Z79890 Hormone replacement therapy (postmenopausal): Secondary | ICD-10-CM

## 2016-10-18 ENCOUNTER — Other Ambulatory Visit: Payer: Self-pay | Admitting: Internal Medicine

## 2016-10-18 DIAGNOSIS — Z1231 Encounter for screening mammogram for malignant neoplasm of breast: Secondary | ICD-10-CM

## 2016-10-28 ENCOUNTER — Ambulatory Visit
Admission: RE | Admit: 2016-10-28 | Discharge: 2016-10-28 | Disposition: A | Discharge status: Home / Self Care | Payer: Managed Care, Other (non HMO) | Source: Ambulatory Visit | Attending: Internal Medicine | Admitting: Internal Medicine

## 2016-10-28 DIAGNOSIS — Z1231 Encounter for screening mammogram for malignant neoplasm of breast: Secondary | ICD-10-CM

## 2016-11-01 ENCOUNTER — Ambulatory Visit (INDEPENDENT_AMBULATORY_CARE_PROVIDER_SITE_OTHER): Payer: Managed Care, Other (non HMO) | Admitting: Advanced Practice Midwife

## 2016-11-01 ENCOUNTER — Encounter: Payer: Self-pay | Admitting: Advanced Practice Midwife

## 2016-11-01 VITALS — BP 134/91 | HR 79 | Ht 61.0 in | Wt 139.0 lb

## 2016-11-01 DIAGNOSIS — Z1151 Encounter for screening for human papillomavirus (HPV): Secondary | ICD-10-CM

## 2016-11-01 DIAGNOSIS — Z01419 Encounter for gynecological examination (general) (routine) without abnormal findings: Secondary | ICD-10-CM | POA: Diagnosis not present

## 2016-11-01 DIAGNOSIS — Z124 Encounter for screening for malignant neoplasm of cervix: Secondary | ICD-10-CM | POA: Diagnosis not present

## 2016-11-01 NOTE — Patient Instructions (Signed)
Health Maintenance for Postmenopausal Women Menopause is a normal process in which your reproductive ability comes to an end. This process happens gradually over a span of months to years, usually between the ages of 22 and 9. Menopause is complete when you have missed 12 consecutive menstrual periods. It is important to talk with your health care provider about some of the most common conditions that affect postmenopausal women, such as heart disease, cancer, and bone loss (osteoporosis). Adopting a healthy lifestyle and getting preventive care can help to promote your health and wellness. Those actions can also lower your chances of developing some of these common conditions. What should I know about menopause? During menopause, you may experience a number of symptoms, such as:  Moderate-to-severe hot flashes.  Night sweats.  Decrease in sex drive.  Mood swings.  Headaches.  Tiredness.  Irritability.  Memory problems.  Insomnia.  Choosing to treat or not to treat menopausal changes is an individual decision that you make with your health care provider. What should I know about hormone replacement therapy and supplements? Hormone therapy products are effective for treating symptoms that are associated with menopause, such as hot flashes and night sweats. Hormone replacement carries certain risks, especially as you become older. If you are thinking about using estrogen or estrogen with progestin treatments, discuss the benefits and risks with your health care provider. What should I know about heart disease and stroke? Heart disease, heart attack, and stroke become more likely as you age. This may be due, in part, to the hormonal changes that your body experiences during menopause. These can affect how your body processes dietary fats, triglycerides, and cholesterol. Heart attack and stroke are both medical emergencies. There are many things that you can do to help prevent heart disease  and stroke:  Have your blood pressure checked at least every 1-2 years. High blood pressure causes heart disease and increases the risk of stroke.  If you are 53-22 years old, ask your health care provider if you should take aspirin to prevent a heart attack or a stroke.  Do not use any tobacco products, including cigarettes, chewing tobacco, or electronic cigarettes. If you need help quitting, ask your health care provider.  It is important to eat a healthy diet and maintain a healthy weight. ? Be sure to include plenty of vegetables, fruits, low-fat dairy products, and lean protein. ? Avoid eating foods that are high in solid fats, added sugars, or salt (sodium).  Get regular exercise. This is one of the most important things that you can do for your health. ? Try to exercise for at least 150 minutes each week. The type of exercise that you do should increase your heart rate and make you sweat. This is known as moderate-intensity exercise. ? Try to do strengthening exercises at least twice each week. Do these in addition to the moderate-intensity exercise.  Know your numbers.Ask your health care provider to check your cholesterol and your blood glucose. Continue to have your blood tested as directed by your health care provider.  What should I know about cancer screening? There are several types of cancer. Take the following steps to reduce your risk and to catch any cancer development as early as possible. Breast Cancer  Practice breast self-awareness. ? This means understanding how your breasts normally appear and feel. ? It also means doing regular breast self-exams. Let your health care provider know about any changes, no matter how small.  If you are 40  or older, have a clinician do a breast exam (clinical breast exam or CBE) every year. Depending on your age, family history, and medical history, it may be recommended that you also have a yearly breast X-ray (mammogram).  If you  have a family history of breast cancer, talk with your health care provider about genetic screening.  If you are at high risk for breast cancer, talk with your health care provider about having an MRI and a mammogram every year.  Breast cancer (BRCA) gene test is recommended for women who have family members with BRCA-related cancers. Results of the assessment will determine the need for genetic counseling and BRCA1 and for BRCA2 testing. BRCA-related cancers include these types: ? Breast. This occurs in males or females. ? Ovarian. ? Tubal. This may also be called fallopian tube cancer. ? Cancer of the abdominal or pelvic lining (peritoneal cancer). ? Prostate. ? Pancreatic.  Cervical, Uterine, and Ovarian Cancer Your health care provider may recommend that you be screened regularly for cancer of the pelvic organs. These include your ovaries, uterus, and vagina. This screening involves a pelvic exam, which includes checking for microscopic changes to the surface of your cervix (Pap test).  For women ages 21-65, health care providers may recommend a pelvic exam and a Pap test every three years. For women ages 79-65, they may recommend the Pap test and pelvic exam, combined with testing for human papilloma virus (HPV), every five years. Some types of HPV increase your risk of cervical cancer. Testing for HPV may also be done on women of any age who have unclear Pap test results.  Other health care providers may not recommend any screening for nonpregnant women who are considered low risk for pelvic cancer and have no symptoms. Ask your health care provider if a screening pelvic exam is right for you.  If you have had past treatment for cervical cancer or a condition that could lead to cancer, you need Pap tests and screening for cancer for at least 20 years after your treatment. If Pap tests have been discontinued for you, your risk factors (such as having a new sexual partner) need to be  reassessed to determine if you should start having screenings again. Some women have medical problems that increase the chance of getting cervical cancer. In these cases, your health care provider may recommend that you have screening and Pap tests more often.  If you have a family history of uterine cancer or ovarian cancer, talk with your health care provider about genetic screening.  If you have vaginal bleeding after reaching menopause, tell your health care provider.  There are currently no reliable tests available to screen for ovarian cancer.  Lung Cancer Lung cancer screening is recommended for adults 69-62 years old who are at high risk for lung cancer because of a history of smoking. A yearly low-dose CT scan of the lungs is recommended if you:  Currently smoke.  Have a history of at least 30 pack-years of smoking and you currently smoke or have quit within the past 15 years. A pack-year is smoking an average of one pack of cigarettes per day for one year.  Yearly screening should:  Continue until it has been 15 years since you quit.  Stop if you develop a health problem that would prevent you from having lung cancer treatment.  Colorectal Cancer  This type of cancer can be detected and can often be prevented.  Routine colorectal cancer screening usually begins at  age 42 and continues through age 45.  If you have risk factors for colon cancer, your health care provider may recommend that you be screened at an earlier age.  If you have a family history of colorectal cancer, talk with your health care provider about genetic screening.  Your health care provider may also recommend using home test kits to check for hidden blood in your stool.  A small camera at the end of a tube can be used to examine your colon directly (sigmoidoscopy or colonoscopy). This is done to check for the earliest forms of colorectal cancer.  Direct examination of the colon should be repeated every  5-10 years until age 71. However, if early forms of precancerous polyps or small growths are found or if you have a family history or genetic risk for colorectal cancer, you may need to be screened more often.  Skin Cancer  Check your skin from head to toe regularly.  Monitor any moles. Be sure to tell your health care provider: ? About any new moles or changes in moles, especially if there is a change in a mole's shape or color. ? If you have a mole that is larger than the size of a pencil eraser.  If any of your family members has a history of skin cancer, especially at a young age, talk with your health care provider about genetic screening.  Always use sunscreen. Apply sunscreen liberally and repeatedly throughout the day.  Whenever you are outside, protect yourself by wearing long sleeves, pants, a wide-brimmed hat, and sunglasses.  What should I know about osteoporosis? Osteoporosis is a condition in which bone destruction happens more quickly than new bone creation. After menopause, you may be at an increased risk for osteoporosis. To help prevent osteoporosis or the bone fractures that can happen because of osteoporosis, the following is recommended:  If you are 46-71 years old, get at least 1,000 mg of calcium and at least 600 mg of vitamin D per day.  If you are older than age 55 but younger than age 65, get at least 1,200 mg of calcium and at least 600 mg of vitamin D per day.  If you are older than age 54, get at least 1,200 mg of calcium and at least 800 mg of vitamin D per day.  Smoking and excessive alcohol intake increase the risk of osteoporosis. Eat foods that are rich in calcium and vitamin D, and do weight-bearing exercises several times each week as directed by your health care provider. What should I know about how menopause affects my mental health? Depression may occur at any age, but it is more common as you become older. Common symptoms of depression  include:  Low or sad mood.  Changes in sleep patterns.  Changes in appetite or eating patterns.  Feeling an overall lack of motivation or enjoyment of activities that you previously enjoyed.  Frequent crying spells.  Talk with your health care provider if you think that you are experiencing depression. What should I know about immunizations? It is important that you get and maintain your immunizations. These include:  Tetanus, diphtheria, and pertussis (Tdap) booster vaccine.  Influenza every year before the flu season begins.  Pneumonia vaccine.  Shingles vaccine.  Your health care provider may also recommend other immunizations. This information is not intended to replace advice given to you by your health care provider. Make sure you discuss any questions you have with your health care provider. Document Released: 06/18/2005  Document Revised: 11/14/2015 Document Reviewed: 01/28/2015 Elsevier Interactive Patient Education  2018 [employer]  

## 2016-11-01 NOTE — Progress Notes (Signed)
GYNECOLOGY ANNUAL PREVENTATIVE CARE ENCOUNTER NOTE  Subjective:   [name] is a 61 y.o. G4P3003 female here for a routine annual gynecologic exam.  Current complaints: hot flashes.  Not using hormonal therapy any longer. Has compounded hormones from her [facility] doctors..   Denies abnormal vaginal bleeding, discharge, pelvic pain, problems with intercourse or other gynecologic concerns. No longer sexually active.   Gynecologic History No LMP recorded. Patient is postmenopausal. Contraception: postmenopausal Last Pap: 10/16/15. Results were: normal Last mammogram: 10/28/16. Results were: normal  Obstetric History OB History  Gravida Para Term Preterm AB Living  3 3 3     3   SAB TAB Ectopic Multiple Live Births               # Outcome Date GA Lbr Len/2nd Weight Sex Delivery Anes PTL Lv  3 Term      CS-Unspec     2 Term           1 Term      CS-Unspec         Past Medical History:  Diagnosis Date  . GERD (gastroesophageal reflux disease)   . 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 Hypothyroidism   . Menopause   . PONV (postoperative nausea and vomiting)     Past Surgical History:  Procedure Laterality Date  . [place]  . APPENDECTOMY  1995  . CESAREAN SECTION  2001  . COLONOSCOPY    . OOPHORECTOMY  1970's   left  . SHOULDER SURGERY    . THYROIDECTOMY  2003  . TONSILLECTOMY    . TUBAL LIGATION      Current Outpatient Prescriptions on File Prior to Visit  Medication Sig Dispense Refill  . losartan (COZAAR) 50 MG tablet Take 100 mg by mouth daily. Take half tablet(50mg  daily)    . thyroid (ARMOUR) 120 MG tablet Take 120 mg by mouth daily.    Marland Kitchen amLODipine (NORVASC) 5 MG tablet Take 5 mg by mouth daily.    . citalopram (CELEXA) 10 MG tablet Take 10 mg by mouth daily.    Marland Kitchen esomeprazole (NEXIUM) 40 MG capsule Take 40 mg by mouth daily before breakfast.    . estradiol (VIVELLE-DOT) 0.1 MG/24HR patch Place 1 patch (0.1 mg total) onto the skin 2  (two) times a week. (Patient not taking: Reported on 11/01/2016) 8 patch 12  . LOMAIRA 8 MG TABS TAKE ONE TABLET BY MOUTH mid morning EACH DAY  1  . norethindrone (AYGESTIN) 5 MG tablet TAKE ONE TABLET BY MOUTH EVERY DAY (Patient not taking: Reported on 11/01/2016) 30 tablet 12  . norethindrone (AYGESTIN) 5 MG tablet TAKE ONE TABLET BY MOUTH DAILY (Patient not taking: Reported on 11/01/2016) 30 tablet 12  . psyllium (METAMUCIL) 58.6 % packet Take 1 packet by mouth daily.      Marland Kitchen rOPINIRole (REQUIP) 1 MG tablet Take one tablet (1 mg total) by mouth at bedtime.  3  . traMADol (ULTRAM) 50 MG tablet Take 1-2 tablets (50-100 mg total) by mouth every 4 (four) hours as needed for pain. (Patient not taking: Reported on 11/01/2016) 60 tablet 0   No current facility-administered medications on file prior to visit.     Allergies  Allergen Reactions  . Lidocaine Nausea And Vomiting  . Sulfa Antibiotics Hives  . Sulfasalazine Hives and Rash  . Codeine Hives  . Hydrocodone Nausea And Vomiting  . Percocet [Oxycodone-Acetaminophen]   . Prednisone     Causes her  to have vaginal bleeding    Social History   Social History  . Marital status: Legally Separated    Spouse name: N/A  . Number of children: 3  . Years of education: N/A   Occupational History  . installation service rep   .  [employer]   Social History Main Topics  . Smoking status: Never Smoker  . Smokeless tobacco: Never Used  . Alcohol use No     Comment: wine socially  . Drug use: No  . Sexual activity: Not Currently   Other Topics Concern  . Not on file   Social History Narrative  . No narrative on file    Family History  Problem Relation Age of Onset  . Lung cancer Father   . Colitis Father   . Alcohol abuse Father   . Stroke Mother   . Colon cancer Neg Hx   . Breast cancer Neg Hx     The following portions of the patient's history were reviewed and updated as appropriate: allergies, current medications, past  family history, past medical history, past social history, past surgical history and problem list.  Review of Systems Pertinent items are noted in HPI.   Objective:  BP (!) 134/91   Pulse 79   Ht 5\' 1"  (1.549 m)   Wt 139 lb (63 kg)   BMI 26.26 kg/m  CONSTITUTIONAL: Well-developed, well-nourished female in no acute distress.  HENT:  Normocephalic, atraumatic, External right and left ear normal. Oropharynx is clear and moist EYES: Conjunctivae and EOM are normal. Pupils are equal, round, and reactive to light. No scleral icterus.  NECK: Normal range of motion, supple, no masses.  Normal thyroid.  SKIN: Skin is warm and dry. No rash noted. Not diaphoretic. No erythema. No pallor. NEUROLOGIC: Alert and oriented to person, place, and time. Normal reflexes, muscle tone coordination. No cranial nerve deficit noted. PSYCHIATRIC: Normal mood and affect. Normal behavior. Normal judgment and thought content. CARDIOVASCULAR: Normal heart rate noted, regular rhythm RESPIRATORY: Clear to auscultation bilaterally. Effort and breath sounds normal, no problems with respiration noted. BREASTS: Symmetric in size. No masses, skin changes, nipple drainage, or lymphadenopathy. ABDOMEN: Soft, normal bowel sounds, no distention noted.  No tenderness, rebound or guarding.  PELVIC: Normal appearing external genitalia; normal appearing vaginal mucosa and cervix.  No abnormal discharge noted.  Pap smear obtained.  Normal uterine size, no other palpable masses, no uterine or adnexal tenderness. MUSCULOSKELETAL: Normal range of motion. No tenderness.  No cyanosis, clubbing, or edema.  2+ distal pulses.   Assessment:  Annual gynecologic examination with pap smear   Plan:  Will follow up results of pap smear and manage accordingly. Mammogram done Routine preventative health maintenance measures emphasized. Please refer to After Visit Summary for other counseling recommendations.

## 2016-11-03 ENCOUNTER — Encounter: Payer: Self-pay | Admitting: Advanced Practice Midwife

## 2016-11-03 DIAGNOSIS — R87619 Unspecified abnormal cytological findings in specimens from cervix uteri: Secondary | ICD-10-CM | POA: Insufficient documentation

## 2016-11-03 LAB — CYTOLOGY - PAP
Adequacy: ABSENT — AB
HPV (WINDOPATH): DETECTED — AB

## 2016-11-08 ENCOUNTER — Telehealth: Payer: Self-pay | Admitting: General Practice

## 2016-11-08 NOTE — Telephone Encounter (Signed)
Called patient and left message on VM regarding appointment on 12/02/16 at 8:00am with Dr. Hulan Fray.  Asked patient to call our office if unable to keep appointment.

## 2016-12-02 ENCOUNTER — Ambulatory Visit: Payer: Managed Care, Other (non HMO) | Admitting: Obstetrics & Gynecology

## 2016-12-02 ENCOUNTER — Encounter: Payer: Managed Care, Other (non HMO) | Admitting: Obstetrics & Gynecology

## 2016-12-23 ENCOUNTER — Encounter: Payer: Self-pay | Admitting: *Deleted

## 2016-12-23 ENCOUNTER — Encounter: Payer: Self-pay | Admitting: Obstetrics & Gynecology

## 2016-12-23 ENCOUNTER — Ambulatory Visit (INDEPENDENT_AMBULATORY_CARE_PROVIDER_SITE_OTHER): Payer: Managed Care, Other (non HMO) | Admitting: Obstetrics & Gynecology

## 2016-12-23 VITALS — BP 150/100 | HR 91 | Resp 16 | Ht 61.0 in | Wt 138.0 lb

## 2016-12-23 DIAGNOSIS — R87612 Low grade squamous intraepithelial lesion on cytologic smear of cervix (LGSIL): Secondary | ICD-10-CM | POA: Diagnosis not present

## 2016-12-23 DIAGNOSIS — B977 Papillomavirus as the cause of diseases classified elsewhere: Secondary | ICD-10-CM

## 2016-12-23 NOTE — Progress Notes (Signed)
   Subjective:    Patient ID: [name], female    DOB: [birth year], 61 y.o.   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 Her pap last year was normal.   Review of Systems     Objective:   Physical Exam  Well nourished, well hydrated white female, no apparent distress Breathing, conversing, and ambulating normally UPT negative, consent signed, time out done Cervix prepped with acetic acid. Transformation zone seen in its entirety. Colpo adequate. Normal colpo ECC obtained. She tolerated the procedure well.     Assessment & Plan:  LGSIL pap, normal colpo Await ECC

## 2016-12-27 ENCOUNTER — Other Ambulatory Visit: Payer: Self-pay | Admitting: Obstetrics & Gynecology

## 2016-12-27 DIAGNOSIS — N951 Menopausal and female climacteric states: Secondary | ICD-10-CM

## 2016-12-30 ENCOUNTER — Telehealth: Payer: Self-pay | Admitting: *Deleted

## 2016-12-30 NOTE — Telephone Encounter (Signed)
Pt notified of ECC results and LEEP scheduled for 01/27/17

## 2016-12-30 NOTE — Telephone Encounter (Signed)
-----   Message from Emily Filbert, MD sent at 12/29/2016  3:53 PM EDT ----- Her ECC was positive. She will need a LEEP. Apparently, we are running a special on this situation.

## 2017-01-27 ENCOUNTER — Encounter: Payer: Managed Care, Other (non HMO) | Admitting: Obstetrics & Gynecology

## 2017-02-24 ENCOUNTER — Encounter: Payer: Self-pay | Admitting: Obstetrics & Gynecology

## 2017-02-24 ENCOUNTER — Ambulatory Visit (INDEPENDENT_AMBULATORY_CARE_PROVIDER_SITE_OTHER): Payer: Managed Care, Other (non HMO) | Admitting: Obstetrics & Gynecology

## 2017-02-24 VITALS — BP 155/91 | HR 84 | Resp 16 | Ht 61.0 in | Wt 139.0 lb

## 2017-02-24 DIAGNOSIS — N870 Mild cervical dysplasia: Secondary | ICD-10-CM | POA: Diagnosis not present

## 2017-02-24 DIAGNOSIS — R350 Frequency of micturition: Secondary | ICD-10-CM

## 2017-02-24 NOTE — Progress Notes (Signed)
   Subjective:    Patient ID: [name], female    DOB: [birth year], 61 y.o.   MRN: [id number]  HPI 61 yo lady here for LEEP. She had a colpo last month for LGSIL. Her colpo appeared normal but her ECC showed CIN1.   Review of Systems     Objective:   Physical Exam  Breathing, conversing, and ambulating normally Well nourished, well hydrated white female, no apparent distress  Colpo Biopsy:   Risks, benefits, alternatives, and limitations of procedure explained to patient, including pain, bleeding, infection, failure to remove abnormal tissue and failure to cure dysplasia, need for repeat procedures, damage to pelvic organs, cervical incompetence.  Role of HPV,cervical dysplasia and need for close followup was empasized. Informed written consent was obtained. All questions were answered. Time out performed. Urine pregnancy test was negative.  Procedure: The patient was placed in lithotomy position and the bivalved coated speculum was placed in the patient's vagina. A grounding pad placed on the patient. Acetic acid was applied to the cervix and areas of decreased uptake were noted around the transformation zone.   Local anesthesia was administered via an intracervical block using 15cc of 2% Lidocaine with epinephrine. The suction was turned on and the large 1X Fisher Cone Biopsy Excisor on 7 Watts of cutting current was used to excise the entire transformation zone and any areas of visible dysplasia. I obtained an ECC.  Excellent hemostasis was achieved using roller ball coagulation set at 50 Watts coagulation current. The speculum was removed from the vagina. Specimens were sent to pathology.  The patient tolerated the procedure well. Post-operative instructions given to patient, including instruction to seek medical attention for persistent bright red bleeding, fever, abdominal/pelvic pain, dysuria, nausea or vomiting. She was also told about the possibility of having copious  yellow to black tinged discharge for weeks. She was counseled to avoid anything in the vagina (sex/douching/tampons) for 3 weeks. She has a 4 week post-operative check to assess wound healing, review results and discuss further management.          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

## 2017-02-24 NOTE — Addendum Note (Signed)
Addended by: Asencion Islam on: 02/24/2017 04:22 PM   Modules accepted: Orders

## 2017-02-26 LAB — CULTURE, URINE COMPREHENSIVE
MICRO NUMBER:: 81165760
RESULT:: NO GROWTH
SPECIMEN QUALITY:: ADEQUATE

## 2017-07-20 ENCOUNTER — Other Ambulatory Visit: Payer: Self-pay | Admitting: Rehabilitation

## 2017-07-20 DIAGNOSIS — M5032 Other cervical disc degeneration, mid-cervical region, unspecified level: Secondary | ICD-10-CM

## 2017-08-01 ENCOUNTER — Ambulatory Visit
Admission: RE | Admit: 2017-08-01 | Discharge: 2017-08-01 | Disposition: A | Discharge status: Home / Self Care | Payer: Managed Care, Other (non HMO) | Source: Ambulatory Visit | Attending: Rehabilitation | Admitting: Rehabilitation

## 2017-08-01 DIAGNOSIS — M5032 Other cervical disc degeneration, mid-cervical region, unspecified level: Secondary | ICD-10-CM

## 2017-08-02 ENCOUNTER — Other Ambulatory Visit: Payer: Managed Care, Other (non HMO)

## 2017-10-06 ENCOUNTER — Telehealth: Payer: Self-pay | Admitting: *Deleted

## 2017-10-06 NOTE — Telephone Encounter (Signed)
Patient called and would like to speak with you about bleeding that has started as of yesterday. Patient stated that she feels that something popped inside of her. Pain did follow but no more pain as of today. Patient declined to schedule an appointment or speak with the RN but asked me to send a message to you instead. Last annual exam was 11/01/16.

## 2017-10-25 ENCOUNTER — Encounter

## 2017-10-25 ENCOUNTER — Encounter (HOSPITAL_COMMUNITY): Payer: Self-pay | Admitting: Psychiatry

## 2017-10-25 ENCOUNTER — Ambulatory Visit (HOSPITAL_COMMUNITY): Payer: Managed Care, Other (non HMO) | Admitting: Psychiatry

## 2017-10-25 VITALS — BP 122/74 | HR 88 | Ht 60.0 in | Wt 142.0 lb

## 2017-10-25 DIAGNOSIS — F341 Dysthymic disorder: Secondary | ICD-10-CM
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
## 2017-10-27 ENCOUNTER — Other Ambulatory Visit: Payer: Self-pay | Admitting: *Deleted

## 2017-10-27 ENCOUNTER — Telehealth: Payer: Self-pay | Admitting: Obstetrics & Gynecology

## 2017-10-27 DIAGNOSIS — N950 Postmenopausal bleeding: Secondary | ICD-10-CM

## 2017-10-27 NOTE — Telephone Encounter (Signed)
I spoke with Ms [name] today. She reports that she had an episode of severe sharp pelvic pain followed by some PMB I have strongly rec'd an u/s to rule uterine cancer.

## 2017-10-27 NOTE — Telephone Encounter (Signed)
I have called and left a second message. 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

## 2017-10-27 NOTE — Progress Notes (Signed)
Pt scheduled for Pelvic U/S 10/28/17 @ 11:30 per Dr Hulan Fray

## 2017-10-28 ENCOUNTER — Ambulatory Visit (INDEPENDENT_AMBULATORY_CARE_PROVIDER_SITE_OTHER): Payer: Managed Care, Other (non HMO)

## 2017-10-28 ENCOUNTER — Other Ambulatory Visit: Payer: Self-pay

## 2017-10-28 DIAGNOSIS — D259 Leiomyoma of uterus, unspecified: Secondary | ICD-10-CM

## 2017-10-28 DIAGNOSIS — N950 Postmenopausal bleeding: Secondary | ICD-10-CM

## 2017-11-11 ENCOUNTER — Other Ambulatory Visit: Payer: Self-pay | Admitting: Orthopaedic Surgery

## 2017-11-11 DIAGNOSIS — M4722 Other spondylosis with radiculopathy, cervical region: Secondary | ICD-10-CM

## 2017-11-15 ENCOUNTER — Ambulatory Visit (INDEPENDENT_AMBULATORY_CARE_PROVIDER_SITE_OTHER): Payer: Managed Care, Other (non HMO) | Admitting: Licensed Clinical Social Worker

## 2017-11-15 ENCOUNTER — Encounter (HOSPITAL_COMMUNITY): Payer: Self-pay | Admitting: Licensed Clinical Social Worker

## 2017-11-15 DIAGNOSIS — F341 Dysthymic disorder: Secondary | ICD-10-CM | POA: Diagnosis not present
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
## 2017-11-21 ENCOUNTER — Other Ambulatory Visit: Payer: Self-pay | Admitting: *Deleted

## 2017-11-21 DIAGNOSIS — Z79890 Hormone replacement therapy (postmenopausal): Secondary | ICD-10-CM

## 2017-11-21 MED ORDER — NORETHINDRONE ACETATE 5 MG PO TABS: 5.0000 mg | ORAL_TABLET | Freq: Every day | ORAL | 12 refills | Status: DC

## 2017-11-23 ENCOUNTER — Other Ambulatory Visit: Payer: Self-pay

## 2017-11-28 ENCOUNTER — Other Ambulatory Visit: Payer: Self-pay | Admitting: Rehabilitation

## 2017-11-28 DIAGNOSIS — M1612 Unilateral primary osteoarthritis, left hip: Secondary | ICD-10-CM

## 2017-11-29 ENCOUNTER — Other Ambulatory Visit: Payer: Self-pay | Admitting: Orthopaedic Surgery

## 2017-11-29 DIAGNOSIS — M1612 Unilateral primary osteoarthritis, left hip: Secondary | ICD-10-CM

## 2017-11-30 ENCOUNTER — Encounter (HOSPITAL_COMMUNITY): Payer: Self-pay | Admitting: Licensed Clinical Social Worker

## 2017-11-30 ENCOUNTER — Ambulatory Visit (INDEPENDENT_AMBULATORY_CARE_PROVIDER_SITE_OTHER): Payer: Managed Care, Other (non HMO) | Admitting: Licensed Clinical Social Worker

## 2017-11-30 DIAGNOSIS — F341 Dysthymic disorder: Secondary | ICD-10-CM | POA: Diagnosis not present
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
## 2017-12-01 ENCOUNTER — Ambulatory Visit
Admission: RE | Admit: 2017-12-01 | Discharge: 2017-12-01 | Disposition: A | Discharge status: Home / Self Care | Payer: Managed Care, Other (non HMO) | Source: Ambulatory Visit | Attending: Rehabilitation | Admitting: Rehabilitation

## 2017-12-01 ENCOUNTER — Ambulatory Visit
Admission: RE | Admit: 2017-12-01 | Discharge: 2017-12-01 | Disposition: A | Discharge status: Home / Self Care | Payer: Managed Care, Other (non HMO) | Source: Ambulatory Visit | Attending: Orthopaedic Surgery | Admitting: Orthopaedic Surgery

## 2017-12-01 DIAGNOSIS — M1612 Unilateral primary osteoarthritis, left hip: Secondary | ICD-10-CM

## 2017-12-14 ENCOUNTER — Ambulatory Visit (HOSPITAL_COMMUNITY): Payer: Self-pay | Admitting: Licensed Clinical Social Worker

## 2017-12-14 ENCOUNTER — Ambulatory Visit (INDEPENDENT_AMBULATORY_CARE_PROVIDER_SITE_OTHER): Payer: Managed Care, Other (non HMO) | Admitting: Licensed Clinical Social Worker

## 2017-12-14 ENCOUNTER — Encounter (HOSPITAL_COMMUNITY): Payer: Self-pay | Admitting: Licensed Clinical Social Worker

## 2017-12-14 DIAGNOSIS — F341 Dysthymic disorder: Secondary | ICD-10-CM | POA: Diagnosis not present

## 2017-12-14 NOTE — Progress Notes (Signed)
   THERAPIST PROGRESS NOTE  Session Time: 5:10-6pm  Participation Level: Active  Behavioral Response: CasualAlertAnxious  Type of Therapy: Individual Therapy  Treatment Goals addressed: Improve psychiatric symptoms, Controlled Behavior, Moderated Mood, Improve Unhelpful Thought Patterns, Emotional Regulation Skills (Moderate moods, anger management, stress management), Feel and express a full Range of Emotions, Learn about Diagnosis, Healthy Coping Skills, Recall the Traumatic event without being overwhelmed     Interventions: Motivational Interviewing  Summary: [name] is a 62 y.o. female who presents for her  individual session. Pt had her cane and struggled to walk. She had her MRI and she will soon have hip replacement surgery. She is currently getting a 2nd opinion on back surgery. Pt looked into an ACOA meeting and found only 1 in [place]. Discussed traits of a ACOA.  Pt reports she has been journaling. Pt read exrcerpts from her journal. Processed this with pt. Educated pt on family roles in alcoholic family (mother). Pt talked a lot of her chaotic childhood. Asked open ended questions and used empathic reflection. Suggested to pt to try to journal some of her memories of childhood and bring her journal to next session. Educated pt on mindfulness and suggested she use mindfulness techniques between sessions.     Suicidal/Homicidal: Nowithout intent/plan  Therapist Response: Assessed pt's current functioning and reviewed progress. Assisted pt  Processing health issues, ACOA, journaling, childhood and mindfulness techniques. Assisted pt processing for the management of her stressors.  Plan: Return again in 2 weeks. ACOA and childhood  Diagnosis: Axis I: Dysthymic disorder    MACKENZIE,LISBETH S, LCAS 12/14/2017

## 2017-12-28 ENCOUNTER — Ambulatory Visit (INDEPENDENT_AMBULATORY_CARE_PROVIDER_SITE_OTHER): Payer: Managed Care, Other (non HMO) | Admitting: Licensed Clinical Social Worker

## 2017-12-28 ENCOUNTER — Encounter (HOSPITAL_COMMUNITY): Payer: Self-pay | Admitting: Licensed Clinical Social Worker

## 2017-12-28 ENCOUNTER — Ambulatory Visit (HOSPITAL_COMMUNITY): Payer: Self-pay | Admitting: Licensed Clinical Social Worker

## 2017-12-28 DIAGNOSIS — F341 Dysthymic disorder: Secondary | ICD-10-CM

## 2017-12-28 NOTE — Progress Notes (Signed)
   THERAPIST PROGRESS NOTE  Session Time: 5:30-6:20pm  Participation Level: Active  Behavioral Response: CasualAlertAnxious  Type of Therapy: Individual Therapy  Treatment Goals addressed: Improve psychiatric symptoms, Controlled Behavior, Moderated Mood, Improve Unhelpful Thought Patterns, Emotional Regulation Skills (Moderate moods, anger management, stress management), Feel and express a full Range of Emotions, Learn about Diagnosis, Healthy Coping Skills,      Interventions: Motivational Interviewing  Summary: [name] is a 62 y.o. female who presents for her  individual session. Pt had her cane and struggled to walk. She had her MRI and she will soon have hip replacement surgery. She is currently getting a 2nd opinion on back surgery. Pt brought in her journal. Pt read from it and processed her feelings.  She reports she was not able to start journaling her feelings about her mother/childhood. Discussed with pt the possibility of her coming to her appt and writing in her journal while in session. This would help her to feel safe and open up about her feelings. Pt felt good about the recommendation. Pt has not been to an ACOA meeting but wants too. Discussed expectations of a meeting and what feelings it may bring up. Continue to suggest mindfulness and suggested she use mindfulness techniques between sessions.     Suicidal/Homicidal: Nowithout intent/plan  Therapist Response: Assessed pt's current functioning and reviewed progress. Assisted pt  Childhood memories, mother issues, health issues, ACOA, journaling, childhood and mindfulness techniques. Assisted pt processing for the management of her stressors.  Plan: Return again in 2 weeks. ACOA and childhood  Diagnosis: Axis I: Dysthymic disorder    Pandora Mccrackin S, LCAS 12/28/2017

## 2018-01-05 ENCOUNTER — Ambulatory Visit (HOSPITAL_COMMUNITY): Payer: Self-pay | Admitting: Psychiatry

## 2018-01-19 ENCOUNTER — Encounter: Payer: Self-pay | Admitting: Obstetrics & Gynecology

## 2018-01-19 ENCOUNTER — Ambulatory Visit: Payer: Managed Care, Other (non HMO) | Admitting: Obstetrics & Gynecology

## 2018-01-19 VITALS — BP 133/88 | HR 112 | Resp 16 | Ht 63.0 in | Wt 144.0 lb

## 2018-01-19 DIAGNOSIS — Z01419 Encounter for gynecological examination (general) (routine) without abnormal findings: Secondary | ICD-10-CM | POA: Diagnosis not present

## 2018-01-19 DIAGNOSIS — Z1151 Encounter for screening for human papillomavirus (HPV): Secondary | ICD-10-CM

## 2018-01-19 DIAGNOSIS — Z124 Encounter for screening for malignant neoplasm of cervix: Secondary | ICD-10-CM

## 2018-01-19 NOTE — Progress Notes (Signed)
Subjective:    [name] is a 62 y.o. divorced P3 (no grands)  female who presents for an annual exam. The patient has no complaints today. She goes to [facility] for her HRT. The patient is not currently sexually active. GYN screening history: last pap: was abnormal: cin 1 with LEEP to follow. The patient wears seatbelts: yes. The patient participates in regular exercise: no because she is now using a walker and cane due to hip pain. Has the patient ever been transfused or tattooed?: yes. The patient reports that there is not domestic violence in her life.   Menstrual History: OB History    Gravida  3   Para  3   Term  3   Preterm      AB      Living  3     SAB      TAB      Ectopic      Multiple      Live Births              Menarche age: 56 No LMP recorded. Patient is postmenopausal.    The following portions of the patient's history were reviewed and updated as appropriate: allergies, current medications, past family history, past medical history, past social history, past surgical history and problem list.  Review of Systems Pertinent items are noted in HPI.   FH- no breast/gyn/colon cancer S/P colonoscopy at 62 years old   Objective:    BP 133/88   Pulse (!) 112   Resp 16   Ht 5\' 3"  (1.6 m)   Wt 144 lb (65.3 kg)   BMI 25.51 kg/m   General Appearance:    Alert, cooperative, no distress, appears stated age  Head:    Normocephalic, without obvious abnormality, atraumatic  Eyes:    PERRL, conjunctiva/corneas clear, EOM's intact, fundi    benign, both eyes  Ears:    Normal TM's and external ear canals, both ears  Nose:   Nares normal, septum midline, mucosa normal, no drainage    or sinus tenderness  Throat:   Lips, mucosa, and tongue normal; teeth and gums normal  Neck:   Supple, symmetrical, trachea midline, no adenopathy;    thyroid:  no enlargement/tenderness/nodules; no carotid   bruit or JVD  Back:     Symmetric, no  curvature, ROM normal, no CVA tenderness  Lungs:     Clear to auscultation bilaterally, respirations unlabored  Chest Wall:    No tenderness or deformity   Heart:    Regular rate and rhythm, S1 and S2 normal, no murmur, rub   or gallop  Breast Exam:    No tenderness, masses, or nipple abnormality  Abdomen:     Soft, non-tender, bowel sounds active all four quadrants,    no masses, no organomegaly  Genitalia:    Normal female without lesion, discharge or tenderness, moderate vulvovaginal atrophy, stenotic cervix, normal size and shape, anteverted, mobile, non-tender, normal adnexal exam      Extremities:   Extremities normal, atraumatic, no cyanosis or edema  Pulses:   2+ and symmetric all extremities  Skin:   Skin color, texture, turgor normal, no rashes or lesions  Lymph nodes:   Cervical, supraclavicular, and axillary nodes normal  Neurologic:   CNII-XII intact, normal strength, sensation and reflexes    throughout  .    Assessment:    Healthy female exam.    Plan:     Thin prep Pap  smear. with cotesting Mammogram

## 2018-01-23 ENCOUNTER — Telehealth: Payer: Self-pay | Admitting: *Deleted

## 2018-01-23 LAB — CYTOLOGY - PAP
DIAGNOSIS: NEGATIVE
HPV: DETECTED — AB

## 2018-01-23 NOTE — Telephone Encounter (Signed)
LM on voicemail that her pap was normal except the HPV was present.  She will just need to have a repeat pap in 1 year with co-testing.

## 2018-01-26 ENCOUNTER — Ambulatory Visit (INDEPENDENT_AMBULATORY_CARE_PROVIDER_SITE_OTHER): Payer: Managed Care, Other (non HMO)

## 2018-01-26 ENCOUNTER — Ambulatory Visit (HOSPITAL_COMMUNITY): Payer: Self-pay | Admitting: Licensed Clinical Social Worker

## 2018-01-26 DIAGNOSIS — Z01419 Encounter for gynecological examination (general) (routine) without abnormal findings: Secondary | ICD-10-CM

## 2018-01-26 DIAGNOSIS — Z1231 Encounter for screening mammogram for malignant neoplasm of breast: Secondary | ICD-10-CM | POA: Diagnosis not present

## 2018-02-06 ENCOUNTER — Other Ambulatory Visit: Payer: Self-pay | Admitting: *Deleted

## 2018-02-06 DIAGNOSIS — N951 Menopausal and female climacteric states: Secondary | ICD-10-CM

## 2018-02-06 MED ORDER — ESTRADIOL 0.1 MG/24HR TD PTTW: 1.0000 | MEDICATED_PATCH | TRANSDERMAL | 3 refills | Status: DC

## 2018-02-06 NOTE — Telephone Encounter (Signed)
Pt called for a Rf on her Vivelle Dot to be sent to [place] as her insurance will cease after today.

## 2018-02-15 ENCOUNTER — Ambulatory Visit (HOSPITAL_COMMUNITY): Payer: Self-pay | Admitting: Licensed Clinical Social Worker

## 2018-03-09 ENCOUNTER — Ambulatory Visit (HOSPITAL_COMMUNITY): Payer: Self-pay | Admitting: Licensed Clinical Social Worker

## 2018-03-22 ENCOUNTER — Ambulatory Visit (HOSPITAL_COMMUNITY): Payer: Self-pay | Admitting: Licensed Clinical Social Worker

## 2019-04-11 ENCOUNTER — Other Ambulatory Visit: Payer: Self-pay | Admitting: Family Medicine

## 2019-04-11 DIAGNOSIS — Z1239 Encounter for other screening for malignant neoplasm of breast: Secondary | ICD-10-CM

## 2019-04-25 ENCOUNTER — Other Ambulatory Visit: Payer: Self-pay

## 2019-04-25 ENCOUNTER — Ambulatory Visit (INDEPENDENT_AMBULATORY_CARE_PROVIDER_SITE_OTHER): Payer: No Typology Code available for payment source | Admitting: Obstetrics & Gynecology

## 2019-04-25 ENCOUNTER — Encounter: Payer: Self-pay | Admitting: Obstetrics & Gynecology

## 2019-04-25 DIAGNOSIS — Z1151 Encounter for screening for human papillomavirus (HPV): Secondary | ICD-10-CM

## 2019-04-25 DIAGNOSIS — Z124 Encounter for screening for malignant neoplasm of cervix: Secondary | ICD-10-CM

## 2019-04-25 DIAGNOSIS — N951 Menopausal and female climacteric states: Secondary | ICD-10-CM | POA: Diagnosis not present

## 2019-04-25 DIAGNOSIS — Z01419 Encounter for gynecological examination (general) (routine) without abnormal findings: Secondary | ICD-10-CM | POA: Diagnosis not present

## 2019-04-25 MED ORDER — ESTRADIOL 0.1 MG/24HR TD PTTW: 1.0000 | MEDICATED_PATCH | TRANSDERMAL | 12 refills | Status: DC

## 2019-04-25 NOTE — Addendum Note (Signed)
Addended by: Lyndal Rainbow on: 04/25/2019 11:42 AM   Modules accepted: Orders

## 2019-04-25 NOTE — Progress Notes (Signed)
Subjective:    [name] is a 63 y.o. P3 who presents for an annual exam. The patient has no complaints today. The patient is sexually active. GYN screening history: last pap: was abnormal: with + HPV, she had a LEEP in 2018. The patient wears seatbelts: yes. The patient participates in regular exercise: yes. Has the patient ever been transfused or tattooed?: yes. The patient reports that there is not domestic violence in her life.   Menstrual History: OB History    Gravida  3   Para  3   Term  3   Preterm      AB      Living  3     SAB      TAB      Ectopic      Multiple      Live Births              Menarche age: 76 No LMP recorded. Patient is postmenopausal.    The following portions of the patient's history were reviewed and updated as appropriate: allergies, current medications, past family history, past medical history, past social history, past surgical history and problem list.  Review of Systems Pertinent items are noted in HPI.   FH- no breast/gyn/colon cancer S/P colonoscopy 2011 She declines a flu vaccine.   Objective:    BP 129/87   Pulse 91   Resp 16   Ht 5\' 3"  (1.6 m)   Wt 142 lb (64.4 kg)   BMI 25.15 kg/m   General Appearance:    Alert, cooperative, no distress, appears stated age  Head:    Normocephalic, without obvious abnormality, atraumatic  Eyes:    PERRL, conjunctiva/corneas clear, EOM's intact, fundi    benign, both eyes  Ears:    Normal TM's and external ear canals, both ears  Nose:   Nares normal, septum midline, mucosa normal, no drainage    or sinus tenderness  Throat:   Lips, mucosa, and tongue normal; teeth and gums normal  Neck:   Supple, symmetrical, trachea midline, no adenopathy;    thyroid:  no enlargement/tenderness/nodules; no carotid   bruit or JVD  Back:     Symmetric, no curvature, ROM normal, no CVA tenderness  Lungs:     Clear to auscultation bilaterally, respirations unlabored  Chest Wall:    No  tenderness or deformity   Heart:    Regular rate and rhythm, S1 and S2 normal, no murmur, rub   or gallop  Breast Exam:    No tenderness, masses, or nipple abnormality  Abdomen:     Soft, non-tender, bowel sounds active all four quadrants,    no masses, no organomegaly  Genitalia:    Normal female without lesion, discharge or tenderness,    8 week size, limited mobility, no palpable adnexal masses.  Extremities:   Extremities normal, atraumatic, no cyanosis or edema  Pulses:   2+ and symmetric all extremities  Skin:   Skin color, texture, turgor normal, no rashes or lesions  Lymph nodes:   Cervical, supraclavicular, and axillary nodes normal  Neurologic:   CNII-XII intact, normal strength, sensation and reflexes    throughout  .    Assessment:    Healthy female exam.    Plan:     Thin prep Pap smear. with cotesting Refill of estrogen oral and progesterone cream

## 2019-04-26 ENCOUNTER — Other Ambulatory Visit: Payer: Self-pay | Admitting: *Deleted

## 2019-04-26 ENCOUNTER — Telehealth: Payer: Self-pay | Admitting: *Deleted

## 2019-04-26 LAB — RPR: RPR Ser Ql: NONREACTIVE

## 2019-04-26 LAB — HEPATITIS C ANTIBODY
Hepatitis C Ab: NONREACTIVE
SIGNAL TO CUT-OFF: 0.01 (ref <1.00)

## 2019-04-26 LAB — HEPATITIS B SURFACE ANTIGEN: Hepatitis B Surface Ag: NONREACTIVE

## 2019-04-26 LAB — HIV ANTIBODY (ROUTINE TESTING W REFLEX): HIV 1&2 Ab, 4th Generation: NONREACTIVE

## 2019-04-26 NOTE — Telephone Encounter (Signed)
[place] called to give [place] information regarding pt's Progesterone cream RX.

## 2019-04-27 ENCOUNTER — Telehealth: Payer: Self-pay | Admitting: Obstetrics & Gynecology

## 2019-04-27 LAB — CYTOLOGY - PAP
Comment: NEGATIVE
Diagnosis: HIGH — AB
High risk HPV: NEGATIVE

## 2019-04-27 NOTE — Telephone Encounter (Signed)
I called to let her know about her pap smear but got no answer.

## 2019-04-30 ENCOUNTER — Encounter: Payer: Self-pay | Admitting: *Deleted

## 2019-05-07 ENCOUNTER — Encounter: Payer: No Typology Code available for payment source | Admitting: Obstetrics & Gynecology

## 2019-05-10 ENCOUNTER — Ambulatory Visit: Payer: No Typology Code available for payment source

## 2019-05-16 ENCOUNTER — Other Ambulatory Visit: Payer: Self-pay

## 2019-05-16 ENCOUNTER — Ambulatory Visit (INDEPENDENT_AMBULATORY_CARE_PROVIDER_SITE_OTHER): Payer: No Typology Code available for payment source

## 2019-05-16 DIAGNOSIS — Z1239 Encounter for other screening for malignant neoplasm of breast: Secondary | ICD-10-CM

## 2019-05-17 ENCOUNTER — Encounter: Payer: Self-pay | Admitting: Obstetrics and Gynecology

## 2019-05-17 ENCOUNTER — Other Ambulatory Visit: Payer: Self-pay | Admitting: Obstetrics and Gynecology

## 2019-05-17 ENCOUNTER — Ambulatory Visit (INDEPENDENT_AMBULATORY_CARE_PROVIDER_SITE_OTHER): Payer: No Typology Code available for payment source | Admitting: Obstetrics and Gynecology

## 2019-05-17 VITALS — BP 124/76 | HR 103 | Temp 98.5°F | Resp 16 | Ht 63.0 in | Wt 140.0 lb

## 2019-05-17 DIAGNOSIS — R87613 High grade squamous intraepithelial lesion on cytologic smear of cervix (HGSIL): Secondary | ICD-10-CM

## 2019-05-17 NOTE — Progress Notes (Signed)
Colposcopy Procedure Note  [name] is a 64 y.o. [phone] here for colposcopy.  Indications:  Pap with HGSIL, neg hr HPV H/o LEEP 2018  Procedure Details  LMP n/a; UPT n/a.    The risks (including infection, bleeding, pain) and benefits of the procedure were explained to the patient and written informed consent was obtained.  The patient was placed in the dorsal lithotomy position. A Graves was speculum inserted in the vagina, and the cervix was visualized.  The cervix was stained with acetic acid and visualized using the colposcope under magnification as well as with a green filter. Findings as below. Cervical biopsies were taken at 7 and 10 o'clock. Endocervical brush used as Kevorkian would not fit into os. Small amount of bleeding noted that improved with pressure. Patient tolerating procedure well.  Findings:  1. Cervix almost flush with apex of vagina, os visible but stenotic, dilated easily with dilator, acetowhite at rim of os, no other abnormal findings 2. Right vaginal wall 0.5 cm papillary mobile, non-friable growth at 10 o'clock  Impression: low grade  Adequate: no  Specimens:  1. Cervical biopsy 2. Endocervical brush 3. Vaginal wall biopsy  Condition: Stable  Complications: None  Plan: The patient was advised to call for any fever or for prolonged or severe pain or bleeding. She was advised to use OTC analgesics as needed for mild to moderate pain. She was advised to avoid vaginal intercourse for 48 hours or until the bleeding has completely stopped.  Will base further management on results of biopsy.   Feliz Beam, M.D. Attending Center for [employer] [facility])

## 2019-05-23 ENCOUNTER — Telehealth: Payer: Self-pay | Admitting: Obstetrics and Gynecology

## 2019-05-23 NOTE — Telephone Encounter (Signed)
Called patient to review results, neg colposocpy but inadequate, does not correlate with HGSIl on pap. Reviewed that I am obtaining a second opinion from [facility]. Reviewed likely would recommend CKC given history of LEEP. Patient denies that she has had a LEEP in the past, states she has only had colposcopy. Will review chart.   Feliz Beam, M.D. Attending [facility] for [employer] [facility])

## 2019-05-25 ENCOUNTER — Telehealth: Payer: Self-pay

## 2019-05-25 NOTE — Telephone Encounter (Signed)
Pt called stating she had Colpo last week and is now experiencing vomiting, diarrhea and vaginal bleeding. I explained to the pt that the colpo wouldn't cause the vomiting and diarrhea but, those things could've aggravated things and caused the vaginal bleeding. Due to pt's symptoms she cannot come into the office and was told to be seen at PCP, urgent care or ED. Pt expressed understanding.

## 2019-05-31 ENCOUNTER — Telehealth: Payer: Self-pay | Admitting: *Deleted

## 2019-05-31 NOTE — Telephone Encounter (Signed)
Pt was notified of her Colpo results.  The pap in 12/20 showed HGSIL with HR HPV.  The Colpo did not show any dysplasia.  Per Dr Rosana Hoes patient needs to be scheduled for a cold knife excision.  Pt states that she does not want this procedure.  She is being scheduled for a my chart visit with Dr Gala Romney to further discuss the results and the reasoning for doing the procedure.

## 2019-06-04 ENCOUNTER — Other Ambulatory Visit: Payer: Self-pay

## 2019-06-04 ENCOUNTER — Ambulatory Visit (INDEPENDENT_AMBULATORY_CARE_PROVIDER_SITE_OTHER): Payer: No Typology Code available for payment source | Admitting: Obstetrics & Gynecology

## 2019-06-04 DIAGNOSIS — N952 Postmenopausal atrophic vaginitis: Secondary | ICD-10-CM | POA: Diagnosis not present

## 2019-06-04 DIAGNOSIS — R87613 High grade squamous intraepithelial lesion on cytologic smear of cervix (HGSIL): Secondary | ICD-10-CM | POA: Diagnosis not present

## 2019-06-04 MED ORDER — ESTRADIOL 10 MCG VA TABS: 1.0000 | ORAL_TABLET | Freq: Every day | VAGINAL | 0 refills | Status: DC

## 2019-06-04 NOTE — Progress Notes (Signed)
TELEHEALTH VIRTUAL GYNECOLOGY VISIT ENCOUNTER NOTE  I connected with [name] on 06/04/19 at  3:15 PM EST by telephone at home and verified that I am speaking with the correct person using two identifiers.   I discussed the limitations, risks, security and privacy concerns of performing an evaluation and management service by telephone and the availability of in person appointments. I also discussed with the patient that there may be a patient responsible charge related to this service. The patient expressed understanding and agreed to proceed.   History:  [name] is a 64 y.o. [id number] female being evaluated today for High grade. She denies any abnormal vaginal discharge, bleeding, pelvic pain or other concerns.       Past Medical History:  Diagnosis Date  . GERD (gastroesophageal reflux disease)   . 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 History of hip replacement    Bilateral  . Hypothyroidism   . Menopause   . PONV (postoperative nausea and vomiting)    Past Surgical History:  Procedure Laterality Date  . [place]  . APPENDECTOMY  1995  . AUGMENTATION MAMMAPLASTY    . CESAREAN SECTION  2001  . COLONOSCOPY    . OOPHORECTOMY  1970's   left  . SHOULDER SURGERY    . THYROIDECTOMY  2003  . TONSILLECTOMY    . TUBAL LIGATION     The following portions of the patient's history were reviewed and updated as appropriate: allergies, current medications, past family history, past medical history, past social history, past surgical history and problem list.   Review of Systems:  Pertinent items noted in HPI and remainder of comprehensive ROS otherwise negative.  Physical Exam:   General:  Alert, oriented and cooperative.   Mental Status: Normal mood and affect perceived. Normal judgment and thought content.  Physical exam deferred due to nature of the encounter  Labs and Imaging No results found for this or any previous visit (from the past 336  hour(s)). MM 3D SCREEN BREAST W/IMPLANT BILATERAL  Result Date: 05/16/2019 CLINICAL DATA:  Screening. EXAM: DIGITAL SCREENING BILATERAL MAMMOGRAM WITH IMPLANTS, CAD AND TOMO The patient has retroglandular implants. Standard and implant displaced views were performed. COMPARISON:  Previous exam(s). ACR Breast Density Category b: There are scattered areas of fibroglandular density. FINDINGS: There are no findings suspicious for malignancy. Images were processed with CAD. New focal lucencies are identified within the LEFT implant, raising the question of implant rupture. IMPRESSION: No mammographic evidence of malignancy. A result letter of this screening mammogram will be mailed directly to the patient. Possible LEFT implant rupture. RECOMMENDATION: Screening mammogram in one year. (Code:[id number]-B-01Y) Consider further evaluation with plastic surgery referral and MRI. BI-RADS CATEGORY  1:  Negative. Electronically Signed   By: Nolon Nations M.D.   On: 05/16/2019 16:43      Assessment and Plan:     History of cervical dysplasia with high grade pap smear, negative HPV, ?inadequate colposcopy but unsure from colposcopy note. Reviewed all paps, notes, colposcopy biopsy and LEEP from 2018 with patient.   2017--Nml Pap, HPV negative 2018--Low grade, +HPV 2018--ECC Low grade on colposcopy 2018--LEEP low grade with negative ECC after LEEP 2019--Nml cytology, +HPV 2020--High Grade pap, negative HPV 2021--colpo-cervix flush with vagina, biopsy negative, negative ECC  Pt would like to try vaginal estrogen and repeat the pap smear in lieu of proceeding with CKC.  I will correlate the two pap smears and biopsy and determine plan. Pt has significant  atrophic vaginitis complaints.  Also discussed using replens after course of estrogen.   Vagifem sent to [facility] (faxed) RTC 4 weeks for pap and exam with me.      I discussed the assessment and treatment plan with the patient. The patient was provided an opportunity to  ask questions and all were answered. The patient agreed with the plan and demonstrated an understanding of the instructions.   The patient was advised to call back or seek an in-person evaluation/go to the ED if the symptoms worsen or if the condition fails to improve as anticipated.  I provided 40 minutes of non-face-to-face time during this encounter.   Silas Sacramento, MD Center for [facility], [place]

## 2019-06-11 ENCOUNTER — Encounter: Payer: Self-pay | Admitting: *Deleted

## 2019-06-11 ENCOUNTER — Telehealth: Payer: Self-pay

## 2019-06-11 DIAGNOSIS — N951 Menopausal and female climacteric states: Secondary | ICD-10-CM

## 2019-06-11 MED ORDER — ESTRADIOL 10 MCG VA TABS: 1.0000 | ORAL_TABLET | Freq: Every day | VAGINAL | 0 refills | Status: DC

## 2019-06-11 NOTE — Telephone Encounter (Signed)
Pt called wanting Estradiol Rx sent to a different pharmacy.

## 2019-06-18 ENCOUNTER — Telehealth: Payer: Self-pay | Admitting: *Deleted

## 2019-06-18 MED ORDER — ESTRADIOL 10 MCG VA TABS: 1.0000 | ORAL_TABLET | VAGINAL | 12 refills | Status: DC

## 2019-06-18 NOTE — Telephone Encounter (Signed)
Pt called requesting a new RX for Estradiol vaginal tabs 10 mcg.  To use Bi-weekly per DR Gala Romney.  This will be sent to [facility].

## 2019-06-19 ENCOUNTER — Encounter: Payer: Self-pay | Admitting: *Deleted

## 2019-07-09 ENCOUNTER — Ambulatory Visit: Payer: No Typology Code available for payment source | Admitting: Obstetrics & Gynecology

## 2019-07-23 ENCOUNTER — Other Ambulatory Visit: Payer: Self-pay | Admitting: Obstetrics & Gynecology

## 2019-07-23 ENCOUNTER — Other Ambulatory Visit: Payer: Self-pay

## 2019-07-23 ENCOUNTER — Encounter: Payer: Self-pay | Admitting: Obstetrics & Gynecology

## 2019-07-23 ENCOUNTER — Ambulatory Visit (INDEPENDENT_AMBULATORY_CARE_PROVIDER_SITE_OTHER): Payer: No Typology Code available for payment source | Admitting: Obstetrics & Gynecology

## 2019-07-23 VITALS — BP 134/87 | HR 96 | Temp 98.2°F | Resp 16 | Ht 63.0 in | Wt 137.0 lb

## 2019-07-23 DIAGNOSIS — R87612 Low grade squamous intraepithelial lesion on cytologic smear of cervix (LGSIL): Secondary | ICD-10-CM | POA: Diagnosis not present

## 2019-07-23 NOTE — Progress Notes (Signed)
   Subjective:    Patient ID: [name], female    DOB: 1955/12/22, 64 y.o.   MRN: [id number]  HPI  Pt presents for pap smear.   History of cervical dysplasia with high grade pap smear, negative HPV, ?inadequate colposcopy but unsure from colposcopy note. Reviewed all paps, notes, colposcopy biopsy and LEEP from 2018 with patient.   2017--Nml Pap, HPV negative 2018--Low grade, +HPV 2018--ECC Low grade on colposcopy 2018--LEEP low grade with negative ECC after LEEP 2019--Nml cytology, +HPV 2020--High Grade pap, negative HPV 2021--colpo-cervix flush with vagina, biopsy negative, negative ECC  Pt also has complaints of urinary incontinence.  She has long stadning issues and has failed PT and medications.  She has had urethral dilations in the past which she thinks is adding to the problems.   She also was recently d/c from the hospital for pancreatitis and cholecystitis.  Today is her first day without diarrhea.    Review of Systems  Constitutional: Negative.   Respiratory: Negative.   Cardiovascular: Negative.   Gastrointestinal: Negative.   Genitourinary: Negative.    Past Medical History:  Diagnosis Date  . GERD (gastroesophageal reflux disease)   . 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 History of hip replacement    Bilateral  . Hypothyroidism   . Menopause   . PONV (postoperative nausea and vomiting)        Objective:   Physical Exam Vitals reviewed.  Constitutional:      General: She is not in acute distress.    Appearance: She is well-developed.  HENT:     Head: Normocephalic and atraumatic.  Eyes:     Conjunctiva/sclera: Conjunctivae normal.  Cardiovascular:     Rate and Rhythm: Normal rate.  Pulmonary:     Effort: Pulmonary effort is normal.  Skin:    General: Skin is warm and dry.  Neurological:     Mental Status: She is alert and oriented to person, place, and time.     Patient given informed consent, signed copy in the chart, time out was performed.   Placed in lithotomy position. Cervix viewed with speculum and colposcope after application of acetic acid.   Colposcopy adequate?  Scarring and atrophic which makes exam difficult. There is adequate cervix present in vault Acetowhite lesions? 3,9, and 12 o'clock Punctation? none Mosaicism?  none Abnormal vasculature?  none Biopsies? 3, 9 & 12 (all in separate bottles) ECC? Done  Vitals:   07/23/19 1356 07/23/19 1428  BP:  134/87  Pulse:  96  Resp: 16 16  Temp:  98.2 F (36.8 C)  Weight:  137 lb (62.1 kg)  Height: 5\' 3"  (1.6 m) 5\' 3"  (1.6 m)   Assessment & Plan:  64 yo female for rpt pap.  She did not want to proceed with CKC or LEEP until she had a re evaluation.  She requested pap, HPV testing and colposcopy to be repeated.  Pt has been using estrogen (Vagifem) which has made exam easier for patient.    Will determine plan based on the results and review of recent pathology with pathology team.  We discussed urinary incontinency, pelvic organ prolapse, and hysterectomy.  This discussion of plan and counseling was 30 minutes on top of the colposcopy time.    Patient was given post procedure instructions and all questions answered.   Silas Sacramento, MD

## 2019-07-24 ENCOUNTER — Encounter: Payer: Self-pay | Admitting: Obstetrics & Gynecology

## 2019-07-27 LAB — CYTOLOGY - PAP
Comment: NEGATIVE
High risk HPV: NEGATIVE

## 2019-08-20 ENCOUNTER — Other Ambulatory Visit: Payer: Self-pay

## 2019-08-20 ENCOUNTER — Ambulatory Visit (INDEPENDENT_AMBULATORY_CARE_PROVIDER_SITE_OTHER): Payer: No Typology Code available for payment source | Admitting: Obstetrics & Gynecology

## 2019-08-20 ENCOUNTER — Other Ambulatory Visit: Payer: Self-pay | Admitting: Obstetrics & Gynecology

## 2019-08-20 ENCOUNTER — Encounter: Payer: Self-pay | Admitting: Obstetrics & Gynecology

## 2019-08-20 VITALS — BP 146/94 | HR 100 | Temp 98.3°F | Resp 16 | Ht 63.0 in | Wt 137.0 lb

## 2019-08-20 DIAGNOSIS — N952 Postmenopausal atrophic vaginitis: Secondary | ICD-10-CM

## 2019-08-20 DIAGNOSIS — R87613 High grade squamous intraepithelial lesion on cytologic smear of cervix (HGSIL): Secondary | ICD-10-CM | POA: Diagnosis not present

## 2019-08-20 DIAGNOSIS — N898 Other specified noninflammatory disorders of vagina: Secondary | ICD-10-CM

## 2019-08-20 NOTE — Progress Notes (Signed)
Subjective:    Patient ID: [name], female    DOB: 1956/01/11, 64 y.o.   MRN: [id number]  HPI  Pt has vaginal dryness during intercourse.  She is on vagifem.  She has not tried lubricant during intercourse or Replens.   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  HPV negative on pap smear   Review of Systems  Respiratory: Negative.   Cardiovascular: Negative.   Genitourinary: Positive for dyspareunia.  Musculoskeletal: Positive for back pain.  Psychiatric/Behavioral: Negative.        Objective:   Physical Exam Vitals reviewed.  Constitutional:      General: She is not in acute distress.    Appearance: She is well-developed.  HENT:     Head: Normocephalic and atraumatic.  Eyes:     Conjunctiva/sclera: Conjunctivae normal.  Cardiovascular:     Rate and Rhythm: Normal rate.  Pulmonary:     Effort: Pulmonary effort is normal.  Genitourinary:    Comments: Vagina pale pink with no atrohpic vaginitis Skin:    General: Skin is warm and dry.  Neurological:     Mental Status: She is alert and oriented to person, place, and time.    LEEP PROCEDURE NOTE Pap smear and colposcopy reviewed.   Pap low grade with HPV negative Colpo Biopsy  Diagnosis 1. Cervix, biopsy, 12 o'clock - LOW GRADE SQUAMOUS INTRAEPITHELIAL LESION, CIN-I (MILD DYSPLASIA). - NO TRANSITIONAL/ENDOCERVICAL GLANDULAR COMPONENT PRESENT FOR EVALUATION. 2. Cervix, biopsy, 9 o'clock - HIGH GRADE SQUAMOUS INTRAEPITHELIAL LESION, CIN-II (MODERATE DYSPLASIA). - NO TRANSITIONAL/ENDOCERVICAL GLANDULAR COMPONENT PRESENT FOR EVALUATION. 3. Cervix, biopsy, 2 o'clock - LOW GRADE SQUAMOUS INTRAEPITHELIAL LESION, CIN-I (MILD DYSPLASIA). - NO TRANSITIONAL/ENDOCERVICAL GLANDULAR COMPONENT PRESENT FOR EVALUATION. 4. Endocervix, curettage - LOW GRADE SQUAMOUS INTRAEPITHELIAL LESION, CIN-I (MILD   Risks, benefits, alternatives, and limitations of procedure explained to patient, including pain, bleeding,  infection, failure to remove abnormal tissue and failure to cure dysplasia, need for repeat procedures, damage to pelvic organs, cervical incompetence.  Role of HPV,cervical dysplasia and need for close followup was empasized. Informed written consent was obtained. All questions were answered. Time out performed.  Procedure: The patient was placed in lithotomy position and the bivalved coated speculum was placed in the patient's vagina. A grounding pad placed on the patient.  Local anesthesia was administered via an intracervical block using 10cc of 2% Lidocaine with epinephrine. The suction was turned on and the Small 1X Fisher Cone Biopsy Excisor on 39 Watts of cutting current was used to excise the area of decreased uptake and excise the entire transformation zone. Excellent hemostasis was achieved using roller ball coagulation set at 50 Watts coagulation current. Monsel's solution was then applied and the speculum was removed from the vagina. Specimens were sent to pathology. The patient tolerated the procedure well. Post-operative instructions given to patient, including instruction to seek medical attention for persistent bright red bleeding, fever, abdominal/pelvic pain, dysuria, nausea or vomiting. She was also told about the possibility of having copious yellow to black tinged discharge. She was counseled to avoid anything in the vagina (sex/douching/tampons) for 4 weeks.   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  Pt would benefit from hysterectomy due to recurrent high grade cervical dysplsia as well as evaluation for her urinary incontinence.  Pt needs referral to urogynecologist to be able to address all issues in one surgery.  Once she recovers  from LEEP and her neck surgery, she will proceed with urogyn eval and surgery.

## 2019-09-17 ENCOUNTER — Other Ambulatory Visit: Payer: Self-pay

## 2019-09-17 ENCOUNTER — Encounter: Payer: Self-pay | Admitting: Obstetrics & Gynecology

## 2019-09-17 ENCOUNTER — Ambulatory Visit (INDEPENDENT_AMBULATORY_CARE_PROVIDER_SITE_OTHER): Payer: No Typology Code available for payment source | Admitting: Obstetrics & Gynecology

## 2019-09-17 VITALS — BP 151/94 | HR 95 | Temp 98.2°F | Resp 16 | Ht 63.0 in | Wt 139.0 lb

## 2019-09-17 DIAGNOSIS — N3946 Mixed incontinence: Secondary | ICD-10-CM

## 2019-09-17 DIAGNOSIS — R87613 High grade squamous intraepithelial lesion on cytologic smear of cervix (HGSIL): Secondary | ICD-10-CM

## 2019-09-17 DIAGNOSIS — N898 Other specified noninflammatory disorders of vagina: Secondary | ICD-10-CM

## 2019-09-17 MED ORDER — ESTRADIOL 10 MCG VA TABS
ORAL_TABLET | VAGINAL | 6 refills | Status: DC
Start: 2019-09-17 — End: 2020-08-20

## 2019-09-17 NOTE — Progress Notes (Signed)
   Subjective:    Patient ID: [name], female    DOB: 12-12-1955, 64 y.o.   MRN: [id number]  HPI  Pt here for follow up from LEEP.  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    Review of Systems  Respiratory: Negative.   Cardiovascular: Negative.   Genitourinary: Negative for pelvic pain.   Cervix, cone - HIGH GRADE SQUAMOUS INTRAEPITHELIAL LESION (CIN-II, MODERATE SQUAMOUS DYSPLASIA) IN CERVICAL TRANSFORMATION ZONE [place]. NO INVASIVE PROCESS IDENTIFIED. - SURGICAL MARGINS APPEAR CLEAR OF THE LESION.    Objective:   Physical Exam Vitals reviewed.  Constitutional:      General: She is not in acute distress.    Appearance: She is well-developed.  HENT:     Head: Normocephalic and atraumatic.  Eyes:     Conjunctiva/sclera: Conjunctivae normal.  Cardiovascular:     Rate and Rhythm: Normal rate.  Pulmonary:     Effort: Pulmonary effort is normal.  Genitourinary:    General: Normal vulva.     Comments: Vagina pink Cervix healing well, almost completely healed from leep.  No discharge Skin:    General: Skin is warm and dry.  Neurological:     Mental Status: She is alert and oriented to person, place, and time.       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  Pt desires surgical solution to treat all three conditions.  Deztiny would be best served by a urogynecologist.  Will refer her to Dr. Zigmund Daniel at [facility].  She will need to get there referral through her PCP due to [place] benefits.    Vaginal dryness--increase vagifem to 3x week and use replens.  HTN--pt should follow up with PCP (has appt in June)  25 minutes spent face to face with patient with >50% counseling.

## 2019-09-19 ENCOUNTER — Encounter: Payer: Self-pay | Admitting: Obstetrics & Gynecology

## 2020-07-15 ENCOUNTER — Telehealth: Payer: Self-pay | Admitting: *Deleted

## 2020-07-15 DIAGNOSIS — N951 Menopausal and female climacteric states: Secondary | ICD-10-CM

## 2020-07-15 MED ORDER — ESTRADIOL 10 MCG VA TABS
1.0000 | ORAL_TABLET | VAGINAL | 6 refills | Status: DC
Start: 2020-07-16 — End: 2021-04-20

## 2020-07-15 MED ORDER — ESTRADIOL 0.1 MG/24HR TD PTTW: 1.0000 | MEDICATED_PATCH | TRANSDERMAL | 3 refills | Status: DC

## 2020-07-15 NOTE — Telephone Encounter (Signed)
Pt request that her vivelle Dot and Estradiol vag tab be switched to [facility] in [place].

## 2020-09-01 NOTE — Patient Instructions (Addendum)
DUE TO COVID-19 ONLY ONE VISITOR IS ALLOWED TO COME WITH YOU AND STAY IN THE WAITING ROOM ONLY DURING PRE OP AND PROCEDURE DAY OF SURGERY. THE 1 VISITOR  MAY VISIT WITH YOU AFTER SURGERY IN YOUR PRIVATE ROOM DURING VISITING HOURS ONLY!  YOU NEED TO HAVE A COVID 19 TEST ON: 09/04/20 @ 12:00 PM, THIS TEST MUST BE DONE BEFORE SURGERY,  COVID TESTING SITE [place] [id number], IT IS ON THE RIGHT GOING OUT [address] APPROXIMATELY  2 MINUTES PAST [employer] ON THE RIGHT. ONCE YOUR COVID TEST IS COMPLETED,  PLEASE BEGIN THE QUARANTINE INSTRUCTIONS AS OUTLINED IN YOUR HANDOUT.                [name]    Your procedure is scheduled on: 09/08/20   Report to [facility] Main  Entrance   Report to short stay at: 5:50 AM     Call this number if you have problems the morning of surgery [phone]    Remember: Do not eat food or drink liquids :After Midnight.   BRUSH YOUR TEETH MORNING OF SURGERY AND RINSE YOUR MOUTH OUT, NO CHEWING GUM CANDY OR MINTS.    Take these medicines the morning of surgery with A SIP OF WATER: Duloxetine,esomeprazole,thyroid,[place].Use inhalers as usual.                               You may not have any metal on your body including hair pins and              piercings  Do not wear jewelry, make-up, lotions, powders or perfumes, deodorant             Do not wear nail polish on your fingernails.  Do not shave  48 hours prior to surgery.    Do not bring valuables to the hospital. [place].  Contacts, dentures or bridgework may not be worn into surgery.  Leave suitcase in the car. After surgery it may be brought to your room.     Patients discharged the day of surgery will not be allowed to drive home. IF YOU ARE HAVING SURGERY AND GOING HOME THE SAME DAY, YOU MUST HAVE AN ADULT TO DRIVE YOU HOME AND BE WITH YOU FOR 24 HOURS. YOU MAY GO HOME BY TAXI OR UBER OR ORTHERWISE,  BUT AN ADULT MUST ACCOMPANY YOU HOME AND STAY WITH YOU FOR 24 HOURS.  Name and phone number of your driver:  Special Instructions: N/A              Please read over the following fact sheets you were given: _____________________________________________________________________          [facility] - Preparing for Surgery Before surgery, you can play an important role.  Because skin is not sterile, your skin needs to be as free of germs as possible.  You can reduce the number of germs on your skin by washing with CHG (chlorahexidine gluconate) soap before surgery.  CHG is an antiseptic cleaner which kills germs and bonds with the skin to continue killing germs even after washing. Please DO NOT use if you have an allergy to CHG or antibacterial soaps.  If your skin becomes reddened/irritated stop using the CHG and inform your nurse when you arrive at  Short Stay. Do not shave (including legs and underarms) for at least 48 hours prior to the first CHG shower.  You may shave your face/neck. Please follow these instructions carefully:  1.  Shower with CHG Soap the night before surgery and the  morning of Surgery.  2.  If you choose to wash your hair, wash your hair first as usual with your  normal  shampoo.  3.  After you shampoo, rinse your hair and body thoroughly to remove the  shampoo.                           4.  Use CHG as you would any other liquid soap.  You can apply chg directly  to the skin and wash                       Gently with a scrungie or clean washcloth.  5.  Apply the CHG Soap to your body ONLY FROM THE NECK DOWN.   Do not use on face/ open                           Wound or open sores. Avoid contact with eyes, ears mouth and genitals (private parts).                       Wash face,  Genitals (private parts) with your normal soap.             6.  Wash thoroughly, paying special attention to the area where your surgery  will be performed.  7.  Thoroughly rinse your body with warm  water from the neck down.  8.  DO NOT shower/wash with your normal soap after using and rinsing off  the CHG Soap.                9.  Pat yourself dry with a clean towel.            10.  Wear clean pajamas.            11.  Place clean sheets on your bed the night of your first shower and do not  sleep with pets. Day of Surgery : Do not apply any lotions/deodorants the morning of surgery.  Please wear clean clothes to the hospital/surgery center.  FAILURE TO FOLLOW THESE INSTRUCTIONS MAY RESULT IN THE CANCELLATION OF YOUR SURGERY PATIENT SIGNATURE_________________________________  NURSE SIGNATURE__________________________________  ________________________________________________________________________

## 2020-09-01 NOTE — Progress Notes (Signed)
Pt. Needs orders for upcomming surgery. PAT and labs on 09/02/20.Thanks.

## 2020-09-02 ENCOUNTER — Encounter (HOSPITAL_COMMUNITY): Payer: Self-pay

## 2020-09-02 ENCOUNTER — Other Ambulatory Visit: Payer: Self-pay

## 2020-09-02 ENCOUNTER — Encounter (HOSPITAL_COMMUNITY)
Admission: RE | Admit: 2020-09-02 | Discharge: 2020-09-02 | Disposition: A | Discharge status: Home / Self Care | Payer: No Typology Code available for payment source | Source: Ambulatory Visit | Attending: Orthopedic Surgery | Admitting: Orthopedic Surgery

## 2020-09-02 DIAGNOSIS — Z01818 Encounter for other preprocedural examination: Secondary | ICD-10-CM | POA: Diagnosis present

## 2020-09-02 HISTORY — DX: Unspecified asthma, uncomplicated: J45.909

## 2020-09-02 HISTORY — DX: Unspecified osteoarthritis, unspecified site: M19.90

## 2020-09-02 HISTORY — DX: Essential (primary) hypertension: I10

## 2020-09-02 HISTORY — DX: Malignant (primary) neoplasm, unspecified: C80.1

## 2020-09-02 LAB — BASIC METABOLIC PANEL
Anion gap: 11 (ref 5–15)
BUN: 22 mg/dL (ref 8–23)
CO2: 26 mmol/L (ref 22–32)
Calcium: 8.7 mg/dL — ABNORMAL LOW (ref 8.9–10.3)
Chloride: 104 mmol/L (ref 98–111)
Creatinine, Ser: 0.8 mg/dL (ref 0.44–1.00)
GFR, Estimated: >60 mL/min (ref >60)
Glucose, Bld: 106 mg/dL — ABNORMAL HIGH (ref 70–99)
Potassium: 3.6 mmol/L (ref 3.5–5.1)
Sodium: 141 mmol/L (ref 135–145)

## 2020-09-02 LAB — CBC
HCT: 39.5 % (ref 36.0–46.0)
Hemoglobin: 12.9 g/dL (ref 12.0–15.0)
MCH: 29.6 pg (ref 26.0–34.0)
MCHC: 32.7 g/dL (ref 30.0–36.0)
MCV: 90.6 fL (ref 80.0–100.0)
Platelets: 263 10*3/uL (ref 150–400)
RBC: 4.36 MIL/uL (ref 3.87–5.11)
RDW: 13.5 % (ref 11.5–15.5)
WBC: 7.9 10*3/uL (ref 4.0–10.5)
nRBC: 0 % (ref 0.0–0.2)

## 2020-09-02 LAB — NO BLOOD PRODUCTS

## 2020-09-02 LAB — SURGICAL PCR SCREEN
MRSA, PCR: NEGATIVE
Staphylococcus aureus: NEGATIVE

## 2020-09-02 NOTE — Progress Notes (Signed)
COVID Vaccine Completed: NO Date COVID Vaccine completed: COVID vaccine manufacturer: [employer]   PCP - Marrion Coy: NP Cardiologist -   Chest x-ray -  EKG -  Stress Test -  ECHO -  Cardiac Cath -  Pacemaker/ICD device last checked:  Sleep Study -  CPAP -   Fasting Blood Sugar -  Checks Blood Sugar _____ times a day  Blood Thinner Instructions: Aspirin Instructions: Last Dose:  Anesthesia review:  Hx: HTN,COVID  Patient denies shortness of breath, fever, cough and chest pain at PAT appointment   Patient verbalized understanding of instructions that were given to them at the PAT appointment. Patient was also instructed that they will need to review over the PAT instructions again at home before surgery.

## 2020-09-02 NOTE — Progress Notes (Signed)
Pt. refuse blood or blood products.

## 2020-09-03 NOTE — H&P (Signed)
TOTAL KNEE ADMISSION H&P  Patient is being admitted for left total knee arthroplasty.  Subjective:  Chief Complaint: Left knee pain.  HPI: [name], 65 y.o. female has a history of pain and functional disability in the left knee due to arthritis and has failed non-surgical conservative treatments for greater than 12 weeks to include corticosteriod injections and activity modification. Onset of symptoms was gradual, starting several years ago with gradually worsening course since that time. The patient noted no past surgery on the left knee.  Patient currently rates pain in the left knee at 7 out of 10 with activity. Patient has night pain, worsening of pain with activity and weight bearing, pain with passive range of motion and crepitus. Patient has evidence of bone-on-bone arthritis in the medial and patellofemoral compartments of the left knee by imaging studies. There is no active infection.  Patient Active Problem List   Diagnosis Date Noted  . Abnormal Pap smear of cervix 11/03/2016  . Glenohumeral arthritis, left 06/13/2016  . Hypertension 09/24/2013  . Asymptomatic postmenopausal status 07/20/2013  . Hypothyroid 07/20/2013  . Localized osteoarthrosis, hand 08/10/2011    Past Medical History:  Diagnosis Date  . Arthritis   . Asthma   . Cancer ([place])    skin  . Cholecystitis   . GERD (gastroesophageal reflux disease)   . 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 History of hip replacement    Bilateral  . Hypertension   . Hypothyroidism   . Menopause   . Pancreatitis   . PONV (postoperative nausea and vomiting)   . Small bowel obstruction due to adhesions ([place])   . Traumatic injury of back     Past Surgical History:  Procedure Laterality Date  . [place]  . ABDOMINAL HYSTERECTOMY    . APPENDECTOMY  1995  . AUGMENTATION MAMMAPLASTY    . CERVICAL SPINE SURGERY    . CESAREAN SECTION  2001  . COLONOSCOPY    . OOPHORECTOMY  1970's   left  . SHOULDER  SURGERY    . THYROIDECTOMY  2003  . TONSILLECTOMY    . TOTAL HIP ARTHROPLASTY Bilateral   . TUBAL LIGATION      Prior to Admission medications   Medication Sig Start Date End Date Taking? Authorizing Provider  ACETYLCYSTEINE PO Take 1 tablet by mouth daily.   Yes [provider]  albuterol (VENTOLIN HFA) 108 (90 Base) MCG/ACT inhaler Inhale 1-2 puffs into the lungs every 6 (six) hours as needed for wheezing or shortness of breath.   Yes [provider]  Alfalfa 650 MG TABS Take 1,300 mg by mouth in the morning and at bedtime.   Yes [provider]  BIOTIN PO Take 1 tablet by mouth daily.   Yes [provider]  calcium citrate (CALCITRATE - DOSED IN MG ELEMENTAL CALCIUM) 950 (200 Ca) MG tablet Take 400 mg of elemental calcium by mouth daily.   Yes [provider]  Cholecalciferol (VITAMIN D3) 250 MCG (10000 UT) capsule Take 10,000 Units by mouth daily.   Yes [provider]  Cod Liver Oil 1000 MG CAPS Take 1,000 mg by mouth daily.   Yes [provider]  Coenzyme Q10 (CO Q-10) 300 MG CAPS Take 300 mg by mouth daily.   Yes [provider]  DEVILS CLAW PO Take 2 tablets by mouth daily.   Yes [provider]  DULoxetine (CYMBALTA) 30 MG capsule Take 60 mg by mouth daily.   Yes [provider]  esomeprazole (NEXIUM) 20 MG capsule Take 20 mg by mouth daily with breakfast.   Yes [provider]  estradiol (VIVELLE-DOT) 0.1 MG/24HR patch Place 1 patch (0.1 mg total) onto the skin 2 (two) times a week. Patient taking differently: Place 0.5 patches onto the skin 2 (two) times a week. 07/17/20  Yes Guss Bunde, MD  Estradiol 10 MCG TABS vaginal tablet Place 1 tablet (10 mcg total) vaginally 3 (three) times a week. 07/16/20  Yes Guss Bunde, MD  folic acid (FOLVITE) A999333 MCG tablet Take 400 mcg by mouth daily.   Yes [provider]  GLUTATHIONE PO Take 2 tablets by mouth daily.   Yes [provider]  L-Arginine 1000 MG TABS Take 1,000 mg by mouth daily.   Yes [provider]  lisinopril (PRINIVIL,ZESTRIL) 10 MG tablet Take 10 mg by mouth in the morning and at bedtime. 08/13/16  Yes [provider]  MAGNESIUM CITRATE PO Take 1 tablet by mouth in the morning and at bedtime.   Yes [provider]  meloxicam (MOBIC) 15 MG tablet Take 15 mg by mouth daily as needed for pain.   Yes [provider]  montelukast (SINGULAIR) 10 MG tablet Take 10 mg by mouth daily as needed (shortness of breath). 04/23/19  Yes [provider]  Niacin POWD Take 100 mg by mouth daily.   Yes [provider]  NIACINAMIDE PO Take 1 tablet by mouth daily.   Yes [provider]  OVER THE COUNTER MEDICATION Take 1 tablet by mouth daily. Beet root   Yes [provider]  pregabalin (LYRICA) 50 MG capsule Take 50 mg by mouth at bedtime.   Yes [provider]  QUERCETIN PO Take 1 tablet by mouth daily.   Yes [provider]  rOPINIRole (REQUIP) 1 MG tablet Take 2 mg by mouth at bedtime.   Yes [provider]  TAURINE PO Take 1 tablet by mouth daily.   Yes [provider]  thyroid (ARMOUR) 90 MG tablet Take 90 mg by mouth daily before breakfast.   Yes [provider]  vitamin B-12 (CYANOCOBALAMIN) 1000 MCG tablet Take 1,000 mcg by mouth daily.   Yes [provider]  vitamin E 1000 UNIT capsule Take 1,000 Units by mouth daily.   Yes [provider]  zinc gluconate 50 MG tablet Take 50 mg by mouth daily.   Yes [provider]  glucose blood test strip 1 each by Other route as needed for other. Use as instructed    [provider]  sucralfate (CARAFATE) 1 g tablet Take by mouth. 07/07/19 08/06/19  [provider]    Allergies  Allergen Reactions  . Lidocaine Nausea And Vomiting  . Sulfa Antibiotics Hives  . Sulfasalazine Hives and Rash  . Amlodipine Swelling   . Codeine Hives  . Hydrocodone Nausea And Vomiting  . Lisinopril Itching    Can take split doses-takes 10 in am and 10 in evening  . Other     Refuse blood or blood products  . Oxycodone Nausea And Vomiting  . Prednisone     Causes her to have vaginal bleeding    Social History   Socioeconomic History  . Marital status: Legally Separated    Spouse name: Not on file  . Number of children: 3  . Years of education: Not on file  . Highest education level: Not on file  Occupational History  . Occupation: Proofreader  service rep    Employer: [employer]  Tobacco Use  . Smoking status: Never Smoker  . Smokeless tobacco: Never Used  Vaping Use  . Vaping Use: Never used  Substance and Sexual Activity  . Alcohol use: Yes    Alcohol/week: 0.0 standard drinks    Comment: wine socially  . Drug use: No  . Sexual activity: Not Currently    Birth control/protection: None  Other Topics Concern  . Not on file  Social History Narrative  . Not on file   Social Determinants of Health   Financial Resource Strain: Not on file  Food Insecurity: Not on file  Transportation Needs: Not on file  Physical Activity: Not on file  Stress: Not on file  Social Connections: Not on file  Intimate Partner Violence: Not on file    Tobacco Use: Low Risk   . Smoking Tobacco Use: Never Smoker  . Smokeless Tobacco Use: Never Used   Social History   Substance and Sexual Activity  Alcohol Use Yes  . Alcohol/week: 0.0 standard drinks   Comment: wine socially    Family History  Problem Relation Age of Onset  . Lung cancer Father   . Colitis Father   . Alcohol abuse Father   . Stroke Mother   . Colon cancer Neg Hx   . Breast cancer Neg Hx     Review of Systems  Constitutional: Negative for chills and fever.  HENT: Negative for congestion, sore throat and tinnitus.   Eyes: Negative for double vision, photophobia and pain.  Respiratory: Negative for cough, shortness of breath and  wheezing.   Cardiovascular: Negative for chest pain, palpitations and orthopnea.  Gastrointestinal: Negative for heartburn, nausea and vomiting.  Genitourinary: Negative for dysuria, frequency and urgency.  Musculoskeletal: Positive for joint pain.  Neurological: Negative for dizziness, weakness and headaches.    Objective:  Physical Exam: Well nourished and well developed.  General: Alert and oriented x3, cooperative and pleasant, no acute distress.  Head: normocephalic, atraumatic, neck supple.  Eyes: EOMI.  Respiratory: breath sounds clear in all fields, no wheezing, rales, or rhonchi. Cardiovascular: Regular rate and rhythm, no murmurs, gallops or rubs.  Abdomen: non-tender to palpation and soft, normoactive bowel sounds. Musculoskeletal:  Left Knee Exam:  No effusion present. No swelling present.  The range of motion is: 0 to 135 degrees.  Moderate crepitus on range of motion of the knee.  Positive medial joint line tenderness.  No lateral joint line tenderness.  Stable knee.   Calves soft and nontender. Motor function intact in LE. Strength 5/5 LE bilaterally. Neuro: Distal pulses 2+. Sensation to light touch intact in LE.  Imaging Review Plain radiographs demonstrate severe degenerative joint disease of the left knee. The overall alignment is neutral. The bone quality appears to be adequate for age and reported activity level.  Assessment/Plan:  End stage arthritis, left knee   The patient history, physical examination, clinical judgment of the provider and imaging studies are consistent with end stage degenerative joint disease of the left knee and total knee arthroplasty is deemed medically necessary. The treatment options including medical management, injection therapy arthroscopy and arthroplasty were discussed at length. The risks and benefits of total knee arthroplasty were presented and reviewed. The risks due to aseptic loosening, infection, stiffness, patella  tracking problems, thromboembolic complications and other imponderables were discussed. The patient acknowledged the explanation, agreed to proceed with the plan and consent was signed. Patient is being admitted for inpatient treatment for  surgery, pain control, PT, OT, prophylactic antibiotics, VTE prophylaxis, progressive ambulation and ADLs and discharge planning. The patient is planning to be discharged home.   Patient's anticipated LOS is less than 2 midnights, meeting these requirements: - Younger than 108 - Lives within 1 hour of care - Has a competent adult at home to recover with post-op recover - NO history of  - Chronic pain requiring opiods  - Diabetes  - Coronary Artery Disease  - Heart failure  - Heart attack  - Stroke  - DVT/VTE  - Cardiac arrhythmia  - Respiratory Failure/COPD  - Renal failure  - Anemia  - Advanced Liver disease  Therapy Plans: Outpatient therapy at [facility] PT Disposition: Home with daughter Planned DVT Prophylaxis: Aspirin 325 mg BID DME Needed: None PCP: Viviano Simas, MD (clearance received) TXA: IV Allergies: Hydrocodone (vomiting), percocet (rash) Anesthesia Concerns: Nausea BMI: 28.8 Last HgbA1c: Not diabetic  Pharmacy: [facility])  Other:  - Wants to stay overnight  - Dilaudid for postoperative pain - No gabapentin taper   - Patient was instructed on what medications to stop prior to surgery. - Follow-up visit in 2 weeks with Dr. Wynelle Link - Begin physical therapy following surgery - Pre-operative lab work as pre-surgical testing - Prescriptions will be provided in hospital at time of discharge  Theresa Duty, PA-C Orthopedic Surgery [facility] [place]

## 2020-09-04 ENCOUNTER — Other Ambulatory Visit: Payer: No Typology Code available for payment source | Attending: Internal Medicine

## 2020-09-04 ENCOUNTER — Other Ambulatory Visit (HOSPITAL_COMMUNITY): Payer: No Typology Code available for payment source

## 2020-09-04 DIAGNOSIS — Z20822 Contact with and (suspected) exposure to covid-19: Secondary | ICD-10-CM

## 2020-09-05 LAB — SARS-COV-2, NAA 2 DAY TAT

## 2020-09-05 LAB — NOVEL CORONAVIRUS, NAA: SARS-CoV-2, NAA: NOT DETECTED

## 2020-09-08 ENCOUNTER — Ambulatory Visit (HOSPITAL_COMMUNITY): Payer: No Typology Code available for payment source | Admitting: Anesthesiology

## 2020-09-08 ENCOUNTER — Observation Stay (HOSPITAL_COMMUNITY)
Admission: RE | Admit: 2020-09-08 | Discharge: 2020-09-09 | Disposition: A | Discharge status: Home / Self Care | Payer: No Typology Code available for payment source | Source: Ambulatory Visit | Attending: Orthopedic Surgery | Admitting: Orthopedic Surgery

## 2020-09-08 ENCOUNTER — Encounter (HOSPITAL_COMMUNITY)
Admission: RE | Disposition: A | Discharge status: Home / Self Care | Payer: Self-pay | Source: Ambulatory Visit | Attending: Orthopedic Surgery

## 2020-09-08 ENCOUNTER — Encounter (HOSPITAL_COMMUNITY): Payer: Self-pay | Admitting: Orthopedic Surgery

## 2020-09-08 ENCOUNTER — Other Ambulatory Visit: Payer: Self-pay

## 2020-09-08 DIAGNOSIS — Z96643 Presence of artificial hip joint, bilateral: Secondary | ICD-10-CM | POA: Insufficient documentation

## 2020-09-08 DIAGNOSIS — M1710 Unilateral primary osteoarthritis, unspecified knee: Secondary | ICD-10-CM

## 2020-09-08 DIAGNOSIS — M1712 Unilateral primary osteoarthritis, left knee: Secondary | ICD-10-CM | POA: Diagnosis present

## 2020-09-08 DIAGNOSIS — I10 Essential (primary) hypertension: Secondary | ICD-10-CM | POA: Diagnosis not present

## 2020-09-08 DIAGNOSIS — J45909 Unspecified asthma, uncomplicated: Secondary | ICD-10-CM | POA: Insufficient documentation

## 2020-09-08 DIAGNOSIS — Z85828 Personal history of other malignant neoplasm of skin: Secondary | ICD-10-CM | POA: Insufficient documentation

## 2020-09-08 DIAGNOSIS — Z79899 Other long term (current) drug therapy: Secondary | ICD-10-CM | POA: Insufficient documentation

## 2020-09-08 DIAGNOSIS — E039 Hypothyroidism, unspecified: Secondary | ICD-10-CM | POA: Diagnosis not present

## 2020-09-08 DIAGNOSIS — M179 Osteoarthritis of knee, unspecified: Secondary | ICD-10-CM

## 2020-09-08 HISTORY — PX: TOTAL KNEE ARTHROPLASTY: SHX125

## 2020-09-08 SURGERY — ARTHROPLASTY, KNEE, TOTAL
Anesthesia: Spinal | Site: Knee | Laterality: Left

## 2020-09-08 MED ORDER — SODIUM CHLORIDE (PF) 0.9 % IJ SOLN
INTRAMUSCULAR | Status: AC
  Filled 2020-09-08: qty 10

## 2020-09-08 MED ORDER — MIDAZOLAM HCL 2 MG/2ML IJ SOLN
INTRAMUSCULAR | Status: AC
  Filled 2020-09-08: qty 2

## 2020-09-08 MED ORDER — CEFAZOLIN SODIUM-DEXTROSE 2-4 GM/100ML-% IV SOLN
2.0000 g | Freq: Four times a day (QID) | INTRAVENOUS | Status: AC
  Administered 2020-09-08 – 2020-09-09 (×2): 2 g via INTRAVENOUS
  Filled 2020-09-08 (×2): qty 100

## 2020-09-08 MED ORDER — PHENOL 1.4 % MT LIQD: 1.0000 | OROMUCOSAL | Status: DC | PRN

## 2020-09-08 MED ORDER — ACETAMINOPHEN 500 MG PO TABS
1000.0000 mg | ORAL_TABLET | Freq: Four times a day (QID) | ORAL | Status: DC
  Administered 2020-09-08 – 2020-09-09 (×3): 1000 mg via ORAL
  Filled 2020-09-08 (×4): qty 2

## 2020-09-08 MED ORDER — ACETAMINOPHEN 10 MG/ML IV SOLN
1000.0000 mg | Freq: Once | INTRAVENOUS | Status: AC
  Administered 2020-09-08: 1000 mg via INTRAVENOUS
  Filled 2020-09-08: qty 100

## 2020-09-08 MED ORDER — PROPOFOL 500 MG/50ML IV EMUL
INTRAVENOUS | Status: DC | PRN
  Administered 2020-09-08: 80 ug/kg/min via INTRAVENOUS

## 2020-09-08 MED ORDER — MIDAZOLAM HCL 2 MG/2ML IJ SOLN
1.0000 mg | INTRAMUSCULAR | Status: DC
  Administered 2020-09-08: 2 mg via INTRAVENOUS

## 2020-09-08 MED ORDER — TRANEXAMIC ACID-NACL 1000-0.7 MG/100ML-% IV SOLN
1000.0000 mg | INTRAVENOUS | Status: AC
  Administered 2020-09-08: 1000 mg via INTRAVENOUS
  Filled 2020-09-08: qty 100

## 2020-09-08 MED ORDER — CEFAZOLIN SODIUM-DEXTROSE 2-4 GM/100ML-% IV SOLN
2.0000 g | Freq: Once | INTRAVENOUS | Status: AC
  Administered 2020-09-08: 2 g via INTRAVENOUS
  Filled 2020-09-08: qty 100

## 2020-09-08 MED ORDER — METHOCARBAMOL 500 MG PO TABS
500.0000 mg | ORAL_TABLET | Freq: Four times a day (QID) | ORAL | Status: DC | PRN
Start: 2020-09-08 — End: 2020-09-09
  Administered 2020-09-08 – 2020-09-09 (×3): 500 mg via ORAL
  Filled 2020-09-08 (×3): qty 1

## 2020-09-08 MED ORDER — PANTOPRAZOLE SODIUM 40 MG PO TBEC
40.0000 mg | DELAYED_RELEASE_TABLET | Freq: Every day | ORAL | Status: DC
  Administered 2020-09-09: 40 mg via ORAL
  Filled 2020-09-08: qty 1

## 2020-09-08 MED ORDER — FENTANYL CITRATE (PF) 100 MCG/2ML IJ SOLN
50.0000 ug | INTRAMUSCULAR | Status: DC
  Administered 2020-09-08: 50 ug via INTRAVENOUS

## 2020-09-08 MED ORDER — POLYETHYLENE GLYCOL 3350 17 G PO PACK: 17.0000 g | PACK | Freq: Every day | ORAL | Status: DC | PRN

## 2020-09-08 MED ORDER — SODIUM CHLORIDE (PF) 0.9 % IJ SOLN
INTRAMUSCULAR | Status: DC | PRN
  Administered 2020-09-08: 60 mL

## 2020-09-08 MED ORDER — SODIUM CHLORIDE 0.9 % IR SOLN
Status: DC | PRN
  Administered 2020-09-08: 1000 mL

## 2020-09-08 MED ORDER — BISACODYL 10 MG RE SUPP: 10.0000 mg | Freq: Every day | RECTAL | Status: DC | PRN

## 2020-09-08 MED ORDER — HYDROMORPHONE HCL 2 MG PO TABS
2.0000 mg | ORAL_TABLET | ORAL | Status: DC | PRN
Start: 2020-09-08 — End: 2020-09-09
  Administered 2020-09-08: 2 mg via ORAL
  Filled 2020-09-08: qty 1

## 2020-09-08 MED ORDER — BUPIVACAINE IN DEXTROSE 0.75-8.25 % IT SOLN
INTRATHECAL | Status: DC | PRN
  Administered 2020-09-08: 1.6 mL via INTRATHECAL

## 2020-09-08 MED ORDER — FLEET ENEMA 7-19 GM/118ML RE ENEM: 1.0000 | ENEMA | Freq: Once | RECTAL | Status: DC | PRN

## 2020-09-08 MED ORDER — FENTANYL CITRATE (PF) 100 MCG/2ML IJ SOLN
INTRAMUSCULAR | Status: AC
  Filled 2020-09-08: qty 2

## 2020-09-08 MED ORDER — DULOXETINE HCL 60 MG PO CPEP
60.0000 mg | ORAL_CAPSULE | Freq: Every day | ORAL | Status: DC
  Administered 2020-09-09: 60 mg via ORAL
  Filled 2020-09-08: qty 1

## 2020-09-08 MED ORDER — BUPIVACAINE HCL (PF) 0.5 % IJ SOLN
INTRAMUSCULAR | Status: DC | PRN
  Administered 2020-09-08: 20 mL via PERINEURAL

## 2020-09-08 MED ORDER — BUPIVACAINE LIPOSOME 1.3 % IJ SUSP
INTRAMUSCULAR | Status: DC | PRN
  Administered 2020-09-08: 20 mL

## 2020-09-08 MED ORDER — STERILE WATER FOR IRRIGATION IR SOLN
Status: DC | PRN
  Administered 2020-09-08: 2000 mL

## 2020-09-08 MED ORDER — METHOCARBAMOL 500 MG IVPB - SIMPLE MED
500.0000 mg | Freq: Four times a day (QID) | INTRAVENOUS | Status: DC | PRN
  Filled 2020-09-08: qty 50

## 2020-09-08 MED ORDER — ACETAMINOPHEN 10 MG/ML IV SOLN: 1000.0000 mg | Freq: Once | INTRAVENOUS | Status: DC | PRN

## 2020-09-08 MED ORDER — SODIUM CHLORIDE 0.9 % IV SOLN: INTRAVENOUS | Status: DC

## 2020-09-08 MED ORDER — MENTHOL 3 MG MT LOZG: 1.0000 | LOZENGE | OROMUCOSAL | Status: DC | PRN

## 2020-09-08 MED ORDER — ONDANSETRON HCL 4 MG/2ML IJ SOLN
INTRAMUSCULAR | Status: AC
  Filled 2020-09-08: qty 2

## 2020-09-08 MED ORDER — ASPIRIN EC 325 MG PO TBEC
325.0000 mg | DELAYED_RELEASE_TABLET | Freq: Two times a day (BID) | ORAL | Status: DC
  Administered 2020-09-09: 325 mg via ORAL
  Filled 2020-09-08: qty 1

## 2020-09-08 MED ORDER — LACTATED RINGERS IV SOLN: INTRAVENOUS | Status: DC

## 2020-09-08 MED ORDER — GLYCOPYRROLATE PF 0.2 MG/ML IJ SOSY
PREFILLED_SYRINGE | INTRAMUSCULAR | Status: DC | PRN
  Administered 2020-09-08: .1 mg via INTRAVENOUS

## 2020-09-08 MED ORDER — ONDANSETRON HCL 4 MG PO TABS: 4.0000 mg | ORAL_TABLET | Freq: Four times a day (QID) | ORAL | Status: DC | PRN

## 2020-09-08 MED ORDER — ONDANSETRON HCL 4 MG/2ML IJ SOLN
4.0000 mg | Freq: Once | INTRAMUSCULAR | Status: AC | PRN
  Administered 2020-09-08: 4 mg via INTRAVENOUS

## 2020-09-08 MED ORDER — MORPHINE SULFATE (PF) 2 MG/ML IV SOLN
0.5000 mg | INTRAVENOUS | Status: DC | PRN
  Administered 2020-09-08: 1 mg via INTRAVENOUS
  Filled 2020-09-08: qty 1

## 2020-09-08 MED ORDER — PHENYLEPHRINE HCL-NACL 10-0.9 MG/250ML-% IV SOLN
INTRAVENOUS | Status: DC | PRN
  Administered 2020-09-08: 50 ug/min via INTRAVENOUS

## 2020-09-08 MED ORDER — ALBUTEROL SULFATE (2.5 MG/3ML) 0.083% IN NEBU: 3.0000 mL | INHALATION_SOLUTION | Freq: Four times a day (QID) | RESPIRATORY_TRACT | Status: DC | PRN

## 2020-09-08 MED ORDER — MIDAZOLAM HCL 2 MG/2ML IJ SOLN
INTRAMUSCULAR | Status: DC | PRN
  Administered 2020-09-08: 2 mg via INTRAVENOUS

## 2020-09-08 MED ORDER — HYDROMORPHONE HCL 1 MG/ML IJ SOLN: 0.2500 mg | INTRAMUSCULAR | Status: DC | PRN

## 2020-09-08 MED ORDER — PROPOFOL 10 MG/ML IV BOLUS
INTRAVENOUS | Status: DC | PRN
  Administered 2020-09-08: 20 mg via INTRAVENOUS
  Administered 2020-09-08: 40 mg via INTRAVENOUS

## 2020-09-08 MED ORDER — DEXAMETHASONE SODIUM PHOSPHATE 10 MG/ML IJ SOLN
INTRAMUSCULAR | Status: DC | PRN
  Administered 2020-09-08: 10 mg via INTRAVENOUS

## 2020-09-08 MED ORDER — DIPHENHYDRAMINE HCL 12.5 MG/5ML PO ELIX
12.5000 mg | ORAL_SOLUTION | ORAL | Status: DC | PRN
  Administered 2020-09-08: 25 mg via ORAL
  Administered 2020-09-09: 12.5 mg via ORAL
  Filled 2020-09-08: qty 10
  Filled 2020-09-08: qty 5

## 2020-09-08 MED ORDER — THYROID 60 MG PO TABS
90.0000 mg | ORAL_TABLET | Freq: Every day | ORAL | Status: DC
  Administered 2020-09-09: 90 mg via ORAL
  Filled 2020-09-08: qty 1

## 2020-09-08 MED ORDER — 0.9 % SODIUM CHLORIDE (POUR BTL) OPTIME
TOPICAL | Status: DC | PRN
  Administered 2020-09-08: 1000 mL

## 2020-09-08 MED ORDER — ONDANSETRON HCL 4 MG/2ML IJ SOLN: 4.0000 mg | Freq: Four times a day (QID) | INTRAMUSCULAR | Status: DC | PRN

## 2020-09-08 MED ORDER — ONDANSETRON HCL 4 MG/2ML IJ SOLN
INTRAMUSCULAR | Status: DC | PRN
  Administered 2020-09-08: 4 mg via INTRAVENOUS

## 2020-09-08 MED ORDER — CHLORHEXIDINE GLUCONATE 0.12 % MT SOLN
15.0000 mL | Freq: Once | OROMUCOSAL | Status: AC
  Administered 2020-09-08: 15 mL via OROMUCOSAL

## 2020-09-08 MED ORDER — ORAL CARE MOUTH RINSE: 15.0000 mL | Freq: Once | OROMUCOSAL | Status: AC

## 2020-09-08 MED ORDER — METOCLOPRAMIDE HCL 5 MG/ML IJ SOLN: 5.0000 mg | Freq: Three times a day (TID) | INTRAMUSCULAR | Status: DC | PRN

## 2020-09-08 MED ORDER — FENTANYL CITRATE (PF) 100 MCG/2ML IJ SOLN
INTRAMUSCULAR | Status: DC | PRN
  Administered 2020-09-08: 50 ug via INTRAVENOUS

## 2020-09-08 MED ORDER — HYDROMORPHONE HCL 2 MG PO TABS
4.0000 mg | ORAL_TABLET | ORAL | Status: DC | PRN
  Administered 2020-09-09 (×4): 4 mg via ORAL
  Filled 2020-09-08 (×4): qty 2

## 2020-09-08 MED ORDER — DOCUSATE SODIUM 100 MG PO CAPS
100.0000 mg | ORAL_CAPSULE | Freq: Two times a day (BID) | ORAL | Status: DC
  Administered 2020-09-08 – 2020-09-09 (×2): 100 mg via ORAL
  Filled 2020-09-08 (×2): qty 1

## 2020-09-08 MED ORDER — ROPINIROLE HCL 1 MG PO TABS
2.0000 mg | ORAL_TABLET | Freq: Every day | ORAL | Status: DC
  Administered 2020-09-08: 2 mg via ORAL
  Filled 2020-09-08: qty 2

## 2020-09-08 MED ORDER — BUPIVACAINE LIPOSOME 1.3 % IJ SUSP
20.0000 mL | Freq: Once | INTRAMUSCULAR | Status: DC
  Filled 2020-09-08: qty 20

## 2020-09-08 MED ORDER — METOCLOPRAMIDE HCL 5 MG PO TABS
5.0000 mg | ORAL_TABLET | Freq: Three times a day (TID) | ORAL | Status: DC | PRN
Start: 2020-09-08 — End: 2020-09-09

## 2020-09-08 SURGICAL SUPPLY — 53 items
ATTUNE PS FEM LT SZ 4 CEM KNEE (Femur) ×2 IMPLANT
ATTUNE PSRP INSR SZ4 10 KNEE (Insert) ×2 IMPLANT
BAG ZIPLOCK 12X15 (MISCELLANEOUS) ×2 IMPLANT
BASE TIBIAL ROT PLAT SZ 3 KNEE (Knees) ×1 IMPLANT
BLADE SAG 18X100X1.27 (BLADE) ×2 IMPLANT
BLADE SAW SGTL 11.0X1.19X90.0M (BLADE) ×2 IMPLANT
BNDG ELASTIC 6X5.8 VLCR STR LF (GAUZE/BANDAGES/DRESSINGS) ×2 IMPLANT
BOWL SMART MIX CTS (DISPOSABLE) ×2 IMPLANT
CEMENT HV SMART SET (Cement) ×2 IMPLANT
CLSR STERI-STRIP ANTIMIC 1/2X4 (GAUZE/BANDAGES/DRESSINGS) ×2 IMPLANT
COVER SURGICAL LIGHT HANDLE (MISCELLANEOUS) ×2 IMPLANT
COVER WAND RF STERILE (DRAPES) IMPLANT
CUFF TOURN SGL QUICK 34 (TOURNIQUET CUFF) ×1
CUFF TRNQT CYL 34X4.125X (TOURNIQUET CUFF) ×1 IMPLANT
DECANTER SPIKE VIAL GLASS SM (MISCELLANEOUS) ×2 IMPLANT
DRAPE U-SHAPE 47X51 STRL (DRAPES) ×2 IMPLANT
DRSG AQUACEL AG ADV 3.5X10 (GAUZE/BANDAGES/DRESSINGS) ×2 IMPLANT
DURAPREP 26ML APPLICATOR (WOUND CARE) ×2 IMPLANT
ELECT REM PT RETURN 15FT ADLT (MISCELLANEOUS) ×2 IMPLANT
GLOVE SRG 8 PF TXTR STRL LF DI (GLOVE) ×1 IMPLANT
GLOVE SURG ENC MOIS LTX SZ6.5 (GLOVE) ×2 IMPLANT
GLOVE SURG ENC MOIS LTX SZ8 (GLOVE) ×4 IMPLANT
GLOVE SURG UNDER POLY LF SZ7 (GLOVE) ×2 IMPLANT
GLOVE SURG UNDER POLY LF SZ8 (GLOVE) ×1
GLOVE SURG UNDER POLY LF SZ8.5 (GLOVE) ×2 IMPLANT
GOWN STRL REUS W/TWL LRG LVL3 (GOWN DISPOSABLE) ×4 IMPLANT
GOWN STRL REUS W/TWL XL LVL3 (GOWN DISPOSABLE) ×2 IMPLANT
HANDPIECE INTERPULSE COAX TIP (DISPOSABLE) ×1
HOLDER FOLEY CATH W/STRAP (MISCELLANEOUS) IMPLANT
IMMOBILIZER KNEE 20 (SOFTGOODS) ×2
IMMOBILIZER KNEE 20 THIGH 36 (SOFTGOODS) ×1 IMPLANT
KIT TURNOVER KIT A (KITS) ×2 IMPLANT
MANIFOLD NEPTUNE II (INSTRUMENTS) ×2 IMPLANT
NS IRRIG 1000ML POUR BTL (IV SOLUTION) ×2 IMPLANT
PACK TOTAL KNEE CUSTOM (KITS) ×2 IMPLANT
PADDING CAST COTTON 6X4 STRL (CAST SUPPLIES) ×2 IMPLANT
PATELLA MEDIAL ATTUN 35MM KNEE (Knees) ×2 IMPLANT
PENCIL SMOKE EVACUATOR (MISCELLANEOUS) ×2 IMPLANT
PIN DRILL FIX HALF THREAD (BIT) ×2 IMPLANT
PIN STEINMAN FIXATION KNEE (PIN) ×2 IMPLANT
PROTECTOR NERVE ULNAR (MISCELLANEOUS) ×2 IMPLANT
SET HNDPC FAN SPRY TIP SCT (DISPOSABLE) ×1 IMPLANT
STRIP CLOSURE SKIN 1/2X4 (GAUZE/BANDAGES/DRESSINGS) ×4 IMPLANT
SUT MNCRL AB 4-0 PS2 18 (SUTURE) ×2 IMPLANT
SUT STRATAFIX 0 PDS 27 VIOLET (SUTURE) ×2
SUT VIC AB 2-0 CT1 27 (SUTURE) ×3
SUT VIC AB 2-0 CT1 TAPERPNT 27 (SUTURE) ×3 IMPLANT
SUTURE STRATFX 0 PDS 27 VIOLET (SUTURE) ×1 IMPLANT
TIBIAL BASE ROT PLAT SZ 3 KNEE (Knees) ×2 IMPLANT
TRAY FOLEY MTR SLVR 16FR STAT (SET/KITS/TRAYS/PACK) ×2 IMPLANT
TUBE SUCTION HIGH CAP CLEAR NV (SUCTIONS) ×2 IMPLANT
WATER STERILE IRR 1000ML POUR (IV SOLUTION) ×4 IMPLANT
WRAP KNEE MAXI GEL POST OP (GAUZE/BANDAGES/DRESSINGS) ×2 IMPLANT

## 2020-09-08 NOTE — Progress Notes (Signed)
Orthopedic Tech Progress Note Patient Details:  [name] 1955/10/17 [id number] Applied CPM to LLE 10 to 40 degrees. CPM Left Knee CPM Left Knee: On Left Knee Flexion (Degrees): 40 Left Knee Extension (Degrees): 10  Post Interventions Patient Tolerated: Well Instructions Provided: Adjustment of device  Petra Kuba 09/08/2020, 6:15 PM

## 2020-09-08 NOTE — Anesthesia Preprocedure Evaluation (Signed)
Anesthesia Evaluation  Patient identified by MRN, date of birth, ID band Patient awake    Reviewed: Allergy & Precautions, NPO status , Patient's Chart, lab work & pertinent test results  History of Anesthesia Complications (+) PONV  Airway Mallampati: II  TM Distance: >3 FB Neck ROM: Full    Dental no notable dental hx.    Pulmonary neg pulmonary ROS,    Pulmonary exam normal breath sounds clear to auscultation       Cardiovascular hypertension, Pt. on medications Normal cardiovascular exam Rhythm:Regular Rate:Normal     Neuro/Psych negative neurological ROS  negative psych ROS   GI/Hepatic Neg liver ROS, GERD  ,  Endo/Other  Hypothyroidism   Renal/GU negative Renal ROS  negative genitourinary   Musculoskeletal negative musculoskeletal ROS (+)   Abdominal   Peds negative pediatric ROS (+)  Hematology negative hematology ROS (+)   Anesthesia Other Findings   Reproductive/Obstetrics negative OB ROS                             Anesthesia Physical Anesthesia Plan  ASA: II  Anesthesia Plan: Spinal   Post-op Pain Management:  Regional for Post-op pain   Induction: Intravenous  PONV Risk Score and Plan: 3 and Ondansetron, Dexamethasone, Propofol infusion and Treatment may vary due to age or medical condition  Airway Management Planned: Simple Face Mask  Additional Equipment:   Intra-op Plan:   Post-operative Plan:   Informed Consent: I have reviewed the patients History and Physical, chart, labs and discussed the procedure including the risks, benefits and alternatives for the proposed anesthesia with the patient or authorized representative who has indicated his/her understanding and acceptance.     Dental advisory given  Plan Discussed with: CRNA and Surgeon  Anesthesia Plan Comments:         Anesthesia Quick Evaluation

## 2020-09-08 NOTE — Interval H&P Note (Signed)
History and Physical Interval Note:  09/08/2020 11:55 AM  [name]  has presented today for surgery, with the diagnosis of Left knee osteoarthritis.  The various methods of treatment have been discussed with the patient and family. After consideration of risks, benefits and other options for treatment, the patient has consented to  Procedure(s): TOTAL KNEE ARTHROPLASTY (Left) as a surgical intervention.  The patient's history has been reviewed, patient examined, no change in status, stable for surgery.  I have reviewed the patient's chart and labs.  Questions were answered to the patient's satisfaction.     Pilar Plate Dio Giller

## 2020-09-08 NOTE — Progress Notes (Signed)
Assisted Dr.George Rose with left Knee adductor canal block. Side rails up, monitors on throughout procedure. See vital signs in flow sheet. Tolerated Procedure well.

## 2020-09-08 NOTE — Anesthesia Procedure Notes (Signed)
Anesthesia Regional Block: Adductor canal block   Pre-Anesthetic Checklist: ,, timeout performed, Correct Patient, Correct Site, Correct Laterality, Correct Procedure, Correct Position, site marked, Risks and benefits discussed,  Surgical consent,  Pre-op evaluation,  At surgeon's request and post-op pain management  Laterality: Left  Prep: chloraprep       Needles:  Injection technique: Single-shot  Needle Type: Echogenic Needle     Needle Length: 9cm      Additional Needles:   Procedures:,,,, ultrasound used (permanent image in chart),,,,  Narrative:  Start time: 09/08/2020 2:11 PM End time: 09/08/2020 2:16 PM Injection made incrementally with aspirations every 5 mL.  Performed by: Personally  Anesthesiologist: Myrtie Soman, MD  Additional Notes: Patient tolerated the procedure well without complications

## 2020-09-08 NOTE — Op Note (Signed)
OPERATIVE REPORT-TOTAL KNEE ARTHROPLASTY   Pre-operative diagnosis- Osteoarthritis  Left knee(s)  Post-operative diagnosis- Osteoarthritis Left knee(s)  Procedure-  Left  Total Knee Arthroplasty  Surgeon- Dione Plover. Cniyah Sproull, MD  Assistant- Theresa Duty, PA-C   Anesthesia-  Adductor canal block and spinal  EBL- 25 ml   Drains None  Tourniquet time- 39 minutes @ 161 mm Hg   Complications- None  Condition-PACU - hemodynamically stable.   Brief Clinical Note  [name] is a 65 y.o. year old female with end stage OA of her left knee with progressively worsening pain and dysfunction. She has constant pain, with activity and at rest and significant functional deficits with difficulties even with ADLs. She has had extensive non-op management including analgesics, injections of cortisone and viscosupplements, and home exercise program, but remains in significant pain with significant dysfunction. Radiographs show bone on bone arthritis medial and patellofemoral. She presents now for left Total Knee Arthroplasty.    Procedure in detail---   The patient is brought into the operating room and positioned supine on the operating table. After successful administration of  Adductor canal block and spinal,   a tourniquet is placed high on the  Left thigh(s) and the lower extremity is prepped and draped in the usual sterile fashion. Time out is performed by the operating team and then the  Left lower extremity is wrapped in Esmarch, knee flexed and the tourniquet inflated to 300 mmHg.       A midline incision is made with a ten blade through the subcutaneous tissue to the level of the extensor mechanism. A fresh blade is used to make a medial parapatellar arthrotomy. Soft tissue over the proximal medial tibia is subperiosteally elevated to the joint line with a knife and into the semimembranosus bursa with a Cobb elevator. Soft tissue over the proximal lateral tibia is elevated with  attention being paid to avoiding the patellar tendon on the tibial tubercle. The patella is everted, knee flexed 90 degrees and the ACL and PCL are removed. Findings are bone on bone all 3 compartments with large marginal osteophytes.        The drill is used to create a starting hole in the distal femur and the canal is thoroughly irrigated with sterile saline to remove the fatty contents. The 5 degree Left  valgus alignment guide is placed into the femoral canal and the distal femoral cutting block is pinned to remove 9 mm off the distal femur. Resection is made with an oscillating saw.      The tibia is subluxed forward and the menisci are removed. The extramedullary alignment guide is placed referencing proximally at the medial aspect of the tibial tubercle and distally along the second metatarsal axis and tibial crest. The block is pinned to remove 53mm off the more deficient medial  side. Resection is made with an oscillating saw. Size 3is the most appropriate size for the tibia and the proximal tibia is prepared with the modular drill and keel punch for that size.      The femoral sizing guide is placed and size 4 is most appropriate. Rotation is marked off the epicondylar axis and confirmed by creating a rectangular flexion gap at 90 degrees. The size 4 cutting block is pinned in this rotation and the anterior, posterior and chamfer cuts are made with the oscillating saw. The intercondylar block is then placed and that cut is made.      Trial size 3 tibial component, trial size  4 posterior stabilized femur and a 10  mm posterior stabilized rotating platform insert trial is placed. Full extension is achieved with excellent varus/valgus and anterior/posterior balance throughout full range of motion. The patella is everted and thickness measured to be 22  mm. Free hand resection is taken to 12 mm, a 35 template is placed, lug holes are drilled, trial patella is placed, and it tracks normally. Osteophytes are  removed off the posterior femur with the trial in place. All trials are removed and the cut bone surfaces prepared with pulsatile lavage. Cement is mixed and once ready for implantation, the size 3 tibial implant, size  4 posterior stabilized femoral component, and the size 35 patella are cemented in place and the patella is held with the clamp. The trial insert is placed and the knee held in full extension. The Exparel (20 ml mixed with 60 ml saline) is injected into the extensor mechanism, posterior capsule, medial and lateral gutters and subcutaneous tissues.  All extruded cement is removed and once the cement is hard the permanent 10 mm posterior stabilized rotating platform insert is placed into the tibial tray.      The wound is copiously irrigated with saline solution and the extensor mechanism closed with # 0 Stratofix suture. The tourniquet is released for a total tourniquet time of 39  minutes. Flexion against gravity is 140 degrees and the patella tracks normally. Subcutaneous tissue is closed with 2.0 vicryl and subcuticular with running 4.0 Monocryl. The incision is cleaned and dried and steri-strips and a bulky sterile dressing are applied. The limb is placed into a knee immobilizer and the patient is awakened and transported to recovery in stable condition.      Please note that a surgical assistant was a medical necessity for this procedure in order to perform it in a safe and expeditious manner. Surgical assistant was necessary to retract the ligaments and vital neurovascular structures to prevent injury to them and also necessary for proper positioning of the limb to allow for anatomic placement of the prosthesis.   Dione Plover Deneka Greenwalt, MD    09/08/2020, 4:31 PM

## 2020-09-08 NOTE — Transfer of Care (Signed)
Immediate Anesthesia Transfer of Care Note  Patient: [name]  Procedure(s) Performed: TOTAL KNEE ARTHROPLASTY (Left Knee)  Patient Location: PACU  Anesthesia Type:Spinal  Level of Consciousness: awake  Airway & Oxygen Therapy: Patient Spontanous Breathing and Patient connected to nasal cannula oxygen  Post-op Assessment: Report given to RN and Post -op Vital signs reviewed and stable  Post vital signs: Reviewed and stable  Last Vitals:  Vitals Value Taken Time  BP 100/64 09/08/20 1653  Temp    Pulse 97 09/08/20 1657  Resp 15 09/08/20 1657  SpO2 100 % 09/08/20 1657  Vitals shown include unvalidated device data.  Last Pain:  Vitals:   09/08/20 1222  TempSrc:   PainSc: 5       Patients Stated Pain Goal: 4 (60/63/01 6010)  Complications: No complications documented.

## 2020-09-08 NOTE — Anesthesia Procedure Notes (Signed)
Anesthesia Procedure Image    

## 2020-09-08 NOTE — Discharge Instructions (Signed)
 Frank Aluisio, MD Total Joint Specialist [facility] [place] [address] [place], [place] [address] [phone]  TOTAL KNEE REPLACEMENT POSTOPERATIVE DIRECTIONS    Knee Rehabilitation, Guidelines Following Surgery  Results after knee surgery are often greatly improved when you follow the exercise, range of motion and muscle strengthening exercises prescribed by your doctor. Safety measures are also important to protect the knee from further injury. If any of these exercises cause you to have increased pain or swelling in your knee joint, decrease the amount until you are comfortable again and slowly increase them. If you have problems or questions, call your caregiver or physical therapist for advice.   BLOOD CLOT PREVENTION . Take a 325 mg Aspirin two times a day for three weeks following surgery. Then take an 81 mg Aspirin once a day for three weeks. Then discontinue Aspirin. . You may resume your vitamins/supplements upon discharge from the hospital. . Do not take any NSAIDs (Advil, Aleve, Ibuprofen, Meloxicam, etc.) until you have discontinued the 325 mg Aspirin.  HOME CARE INSTRUCTIONS  . Remove items at home which could result in a fall. This includes throw rugs or furniture in walking pathways.  . ICE to the affected knee as much as tolerated. Icing helps control swelling. If the swelling is well controlled you will be more comfortable and rehab easier. Continue to use ice on the knee for pain and swelling from surgery. You may notice swelling that will progress down to the foot and ankle. This is normal after surgery. Elevate the leg when you are not up walking on it.    . Continue to use the breathing machine which will help keep your temperature down. It is common for your temperature to cycle up and down following surgery, especially at night when you are not up moving around and exerting yourself. The breathing machine keeps your lungs expanded and your  temperature down. . Do not place pillow under the operative knee, focus on keeping the knee straight while resting  DIET You may resume your previous home diet once you are discharged from the hospital.  DRESSING / WOUND CARE / SHOWERING . Keep your bulky bandage on for 2 days. On the third post-operative day you may remove the Ace bandage and gauze. There is a waterproof adhesive bandage on your skin which will stay in place until your first follow-up appointment. Once you remove this you will not need to place another bandage . You may begin showering 3 days following surgery, but do not submerge the incision under water.  ACTIVITY For the first 5 days, the key is rest and control of pain and swelling . Do your home exercises twice a day starting on post-operative day 3. On the days you go to physical therapy, just do the home exercises once that day. . You should rest, ice and elevate the leg for 50 minutes out of every hour. Get up and walk/stretch for 10 minutes per hour. After 5 days you can increase your activity slowly as tolerated. . Walk with your walker as instructed. Use the walker until you are comfortable transitioning to a cane. Walk with the cane in the opposite hand of the operative leg. You may discontinue the cane once you are comfortable and walking steadily. . Avoid periods of inactivity such as sitting longer than an hour when not asleep. This helps prevent blood clots.  . You may discontinue the knee immobilizer once you are able to perform a straight   leg raise while lying down. . You may resume a sexual relationship in one month or when given the OK by your doctor.  . You may return to work once you are cleared by your doctor.  . Do not drive a car for 6 weeks or until released by your surgeon.  . Do not drive while taking narcotics.  TED HOSE STOCKINGS Wear the elastic stockings on both legs for three weeks following surgery during the day. You may remove them at night  for sleeping.  WEIGHT BEARING Weight bearing as tolerated with assist device (walker, cane, etc) as directed, use it as long as suggested by your surgeon or therapist, typically at least 4-6 weeks.  POSTOPERATIVE CONSTIPATION PROTOCOL Constipation - defined medically as fewer than three stools per week and severe constipation as less than one stool per week.  One of the most common issues patients have following surgery is constipation.  Even if you have a regular bowel pattern at home, your normal regimen is likely to be disrupted due to multiple reasons following surgery.  Combination of anesthesia, postoperative narcotics, change in appetite and fluid intake all can affect your bowels.  In order to avoid complications following surgery, here are some recommendations in order to help you during your recovery period.  . Colace (docusate) - Pick up an over-the-counter form of Colace or another stool softener and take twice a day as long as you are requiring postoperative pain medications.  Take with a full glass of water daily.  If you experience loose stools or diarrhea, hold the colace until you stool forms back up. If your symptoms do not get better within 1 week or if they get worse, check with your doctor. . Dulcolax (bisacodyl) - Pick up over-the-counter and take as directed by the product packaging as needed to assist with the movement of your bowels.  Take with a full glass of water.  Use this product as needed if not relieved by Colace only.  . MiraLax (polyethylene glycol) - Pick up over-the-counter to have on hand. MiraLax is a solution that will increase the amount of water in your bowels to assist with bowel movements.  Take as directed and can mix with a glass of water, juice, soda, coffee, or tea. Take if you go more than two days without a movement. Do not use MiraLax more than once per day. Call your doctor if you are still constipated or irregular after using this medication for 7 days  in a row.  If you continue to have problems with postoperative constipation, please contact the office for further assistance and recommendations.  If you experience "the worst abdominal pain ever" or develop nausea or vomiting, please contact the office immediatly for further recommendations for treatment.  ITCHING If you experience itching with your medications, try taking only a single pain pill, or even half a pain pill at a time.  You can also use Benadryl over the counter for itching or also to help with sleep.   MEDICATIONS See your medication summary on the "After Visit Summary" that the nursing staff will review with you prior to discharge.  You may have some home medications which will be placed on hold until you complete the course of blood thinner medication.  It is important for you to complete the blood thinner medication as prescribed by your surgeon.  Continue your approved medications as instructed at time of discharge.  PRECAUTIONS . If you experience chest pain or shortness of   breath - call 911 immediately for transfer to the hospital emergency department.  . If you develop a fever greater that 101 F, purulent drainage from wound, increased redness or drainage from wound, foul odor from the wound/dressing, or calf pain - CONTACT YOUR SURGEON.                                                   FOLLOW-UP APPOINTMENTS Make sure you keep all of your appointments after your operation with your surgeon and caregivers. You should call the office at the above phone number and make an appointment for approximately two weeks after the date of your surgery or on the date instructed by your surgeon outlined in the "After Visit Summary".  RANGE OF MOTION AND STRENGTHENING EXERCISES  Rehabilitation of the knee is important following a knee injury or an operation. After just a few days of immobilization, the muscles of the thigh which control the knee become weakened and shrink (atrophy). Knee  exercises are designed to build up the tone and strength of the thigh muscles and to improve knee motion. Often times heat used for twenty to thirty minutes before working out will loosen up your tissues and help with improving the range of motion but do not use heat for the first two weeks following surgery. These exercises can be done on a training (exercise) mat, on the floor, on a table or on a bed. Use what ever works the best and is most comfortable for you Knee exercises include:  . Leg Lifts - While your knee is still immobilized in a splint or cast, you can do straight leg raises. Lift the leg to 60 degrees, hold for 3 sec, and slowly lower the leg. Repeat 10-20 times 2-3 times daily. Perform this exercise against resistance later as your knee gets better.  Javier Docker and Hamstring Sets - Tighten up the muscle on the front of the thigh (Quad) and hold for 5-10 sec. Repeat this 10-20 times hourly. Hamstring sets are done by pushing the foot backward against an object and holding for 5-10 sec. Repeat as with quad sets.   Leg Slides: Lying on your back, slowly slide your foot toward your buttocks, bending your knee up off the floor (only go as far as is comfortable). Then slowly slide your foot back down until your leg is flat on the floor again.  Angel Wings: Lying on your back spread your legs to the side as far apart as you can without causing discomfort.  A rehabilitation program following serious knee injuries can speed recovery and prevent re-injury in the future due to weakened muscles. Contact your doctor or a physical therapist for more information on knee rehabilitation.   POST-OPERATIVE OPIOID TAPER INSTRUCTIONS: . It is important to wean off of your opioid medication as soon as possible. If you do not need pain medication after your surgery it is ok to stop day one. Marland Kitchen Opioids include: o Codeine, Hydrocodone(Norco, Vicodin), Oxycodone(Percocet, oxycontin) and hydromorphone amongst others.   . Long term and even short term use of opiods can cause: o Increased pain response o Dependence o Constipation o Depression o Respiratory depression o And more.  . Withdrawal symptoms can include o Flu like symptoms o Nausea, vomiting o And more . Techniques to manage these symptoms o Hydrate well o Eat regular  healthy meals o Stay active o Use relaxation techniques(deep breathing, meditating, yoga) . Do Not substitute Alcohol to help with tapering . If you have been on opioids for less than two weeks and do not have pain than it is ok to stop all together.  . Plan to wean off of opioids o This plan should start within one week post op of your joint replacement. o Maintain the same interval or time between taking each dose and first decrease the dose.  o Cut the total daily intake of opioids by one tablet each day o Next start to increase the time between doses. o The last dose that should be eliminated is the evening dose.     IF YOU ARE TRANSFERRED TO A SKILLED REHAB FACILITY If the patient is transferred to a skilled rehab facility following release from the hospital, a list of the current medications will be sent to the facility for the patient to continue.  When discharged from the skilled rehab facility, please have the facility set up the patient's [place] prior to being released. Also, the skilled facility will be responsible for providing the patient with their medications at time of release from the facility to include their pain medication, the muscle relaxants, and their blood thinner medication. If the patient is still at the rehab facility at time of the two week follow up appointment, the skilled rehab facility will also need to assist the patient in arranging follow up appointment in our office and any transportation needs.  MAKE SURE YOU:  . Understand these instructions.  . Get help right away if you are not doing well or get worse.   DENTAL  ANTIBIOTICS:  In most cases prophylactic antibiotics for Dental procdeures after total joint surgery are not necessary.  Exceptions are as follows:  1. History of prior total joint infection  2. Severely immunocompromised (Organ Transplant, cancer chemotherapy, Rheumatoid biologic meds such as [place])  3. Poorly controlled diabetes (A1C &gt; 8.0, blood glucose over 200)  If you have one of these conditions, contact your surgeon for an antibiotic prescription, prior to your dental procedure.    Pick up stool softner and laxative for home use following surgery while on pain medications. Do not submerge incision under water. Please use good hand washing techniques while changing dressing each day. May shower starting three days after surgery. Please use a clean towel to pat the incision dry following showers. Continue to use ice for pain and swelling after surgery. Do not use any lotions or creams on the incision until instructed by your surgeon.

## 2020-09-08 NOTE — Anesthesia Procedure Notes (Signed)
Spinal  Patient location during procedure: OR Start time: 09/08/2020 3:09 PM End time: 09/08/2020 3:15 PM Reason for block: surgical anesthesia Staffing Performed: anesthesiologist  Anesthesiologist: Myrtie Soman, MD Preanesthetic Checklist Completed: patient identified, IV checked, site marked, risks and benefits discussed, surgical consent, monitors and equipment checked, pre-op evaluation and timeout performed Spinal Block Patient position: sitting Prep: Betadine Patient monitoring: heart rate, continuous pulse ox and blood pressure Approach: midline Location: L3-4 Injection technique: single-shot Needle Needle type: Sprotte  Needle gauge: 24 G Needle length: 9 cm Assessment Sensory level: T6 Events: CSF return Additional Notes Expiration date of kit checked and confirmed. Patient tolerated procedure well, without complications.  Significant scoliosis, went left of midline x 2 attempts

## 2020-09-09 ENCOUNTER — Encounter (HOSPITAL_COMMUNITY): Payer: Self-pay | Admitting: Orthopedic Surgery

## 2020-09-09 DIAGNOSIS — M1712 Unilateral primary osteoarthritis, left knee: Secondary | ICD-10-CM | POA: Diagnosis not present

## 2020-09-09 LAB — BASIC METABOLIC PANEL
Anion gap: 9 (ref 5–15)
BUN: 16 mg/dL (ref 8–23)
CO2: 29 mmol/L (ref 22–32)
Calcium: 8.2 mg/dL — ABNORMAL LOW (ref 8.9–10.3)
Chloride: 100 mmol/L (ref 98–111)
Creatinine, Ser: 0.82 mg/dL (ref 0.44–1.00)
GFR, Estimated: >60 mL/min (ref >60)
Glucose, Bld: 159 mg/dL — ABNORMAL HIGH (ref 70–99)
Potassium: 3.9 mmol/L (ref 3.5–5.1)
Sodium: 138 mmol/L (ref 135–145)

## 2020-09-09 LAB — CBC
HCT: 36.9 % (ref 36.0–46.0)
Hemoglobin: 12 g/dL (ref 12.0–15.0)
MCH: 29.6 pg (ref 26.0–34.0)
MCHC: 32.5 g/dL (ref 30.0–36.0)
MCV: 90.9 fL (ref 80.0–100.0)
Platelets: 265 10*3/uL (ref 150–400)
RBC: 4.06 MIL/uL (ref 3.87–5.11)
RDW: 13.5 % (ref 11.5–15.5)
WBC: 10.8 10*3/uL — ABNORMAL HIGH (ref 4.0–10.5)
nRBC: 0 % (ref 0.0–0.2)

## 2020-09-09 MED ORDER — METHOCARBAMOL 500 MG PO TABS: 500.0000 mg | ORAL_TABLET | Freq: Four times a day (QID) | ORAL | 0 refills | Status: DC | PRN

## 2020-09-09 MED ORDER — HYDROMORPHONE HCL 2 MG PO TABS: 2.0000 mg | ORAL_TABLET | Freq: Four times a day (QID) | ORAL | 0 refills | Status: DC | PRN

## 2020-09-09 MED ORDER — ASPIRIN 325 MG PO TBEC: 325.0000 mg | DELAYED_RELEASE_TABLET | Freq: Two times a day (BID) | ORAL | 0 refills | Status: AC

## 2020-09-09 NOTE — Progress Notes (Signed)
   Subjective: 1 Day Post-Op Procedure(s) (LRB): TOTAL KNEE ARTHROPLASTY (Left) Patient reports pain as mild.   Patient seen in rounds by Dr. Wynelle Link. Patient is well, and has had no acute complaints or problems other than pain in the left knee. No issues overnight. Denies chest pain, SOB, or calf pain. Foley catheter to be removed this AM. We will begin therapy today.   Objective: Vital signs in last 24 hours: Temp:  [97.5 F (36.4 C)-98.1 F (36.7 C)] 98.1 F (36.7 C) (05/03 0415) Pulse Rate:  [87-100] 99 (05/03 0415) Resp:  [15-23] 16 (05/03 0415) BP: (94-142)/(64-101) 128/89 (05/03 0415) SpO2:  [90 %-100 %] 100 % (05/03 0415) Weight:  [68 kg] 68 kg (05/02 1222)  Intake/Output from previous day:  Intake/Output Summary (Last 24 hours) at 09/09/2020 0722 Last data filed at 09/09/2020 0600 Gross per 24 hour  Intake 3397.41 ml  Output 2425 ml  Net 972.41 ml    Labs: Recent Labs    09/09/20 0256  HGB 12.0   Recent Labs    09/09/20 0256  WBC 10.8*  RBC 4.06  HCT 36.9  PLT 265   Recent Labs    09/09/20 0256  NA 138  K 3.9  CL 100  CO2 29  BUN 16  CREATININE 0.82  GLUCOSE 159*  CALCIUM 8.2*   Exam: General - Patient is Alert and Oriented Extremity - Neurologically intact Neurovascular intact Sensation intact distally Dorsiflexion/Plantar flexion intact Dressing - dressing C/D/I Motor Function - intact, moving foot and toes well on exam.   Past Medical History:  Diagnosis Date  . Arthritis   . Asthma   . Cancer ([place])    skin  . Cholecystitis   . GERD (gastroesophageal reflux disease)   . 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 History of hip replacement    Bilateral  . Hypertension   . Hypothyroidism   . Menopause   . Pancreatitis   . PONV (postoperative nausea and vomiting)   . Small bowel obstruction due to adhesions ([place])   . Traumatic injury of back     Assessment/Plan: 1 Day Post-Op Procedure(s) (LRB): TOTAL KNEE ARTHROPLASTY (Left) Principal  Problem:   OA (osteoarthritis) of knee Active Problems:   Primary osteoarthritis of left knee  Estimated body mass index is 29.3 kg/m as calculated from the following:   Height as of this encounter: 5' (1.524 m).   Weight as of this encounter: 68 kg. Advance diet Up with therapy D/C IV fluids   Patient's anticipated LOS is less than 2 midnights, meeting these requirements: - Younger than 68 - Lives within 1 hour of care - Has a competent adult at home to recover with post-op recover - NO history of  - Chronic pain requiring opiods  - Diabetes  - Coronary Artery Disease  - Heart failure  - Heart attack  - Stroke  - DVT/VTE  - Cardiac arrhythmia  - Respiratory Failure/COPD  - Renal failure  - Anemia  - Advanced Liver disease  DVT Prophylaxis - Aspirin Weight bearing as tolerated. Continue therapy.  Plan is to go Home after hospital stay. Plan for discharge later today if progresses with therapy and meeting goals. Scheduled for OPPT at [facility] Follow-up in the office in 2 weeks  The [place] was reviewed today prior to any opioid medications being prescribed to this patient.  Theresa Duty, PA-C Orthopedic Surgery [phone] 09/09/2020, 7:22 AM

## 2020-09-09 NOTE — TOC Transition Note (Signed)
Transition of Care [facility]) - CM/SW Discharge Note   Patient Details  Name: [name] MRN: [id number] Date of Birth: 10/24/1955  Transition of Care [facility]) CM/SW Contact:  Lennart Pall, LCSW Phone Number: 09/09/2020, 12:20 PM   Clinical Narrative:    Met with patient and confirming plan for OPPT at [facility].  Has all needed DME at home.  No further TOC needs.   Final next level of care: OP Rehab Barriers to Discharge: No Barriers Identified   Patient Goals and CMS Choice Patient states their goals for this hospitalization and ongoing recovery are:: return home      Discharge Placement                       Discharge Plan and Services                DME Arranged: N/A DME Agency: NA                  Social Determinants of Health (SDOH) Interventions     Readmission Risk Interventions No flowsheet data found.

## 2020-09-09 NOTE — Anesthesia Postprocedure Evaluation (Signed)
Anesthesia Post Note  Patient: [name]  Procedure(s) Performed: TOTAL KNEE ARTHROPLASTY (Left Knee)     Patient location during evaluation: PACU Anesthesia Type: Spinal Level of consciousness: oriented and awake and alert Pain management: pain level controlled Vital Signs Assessment: post-procedure vital signs reviewed and stable Respiratory status: spontaneous breathing, respiratory function stable and patient connected to nasal cannula oxygen Cardiovascular status: blood pressure returned to baseline and stable Postop Assessment: no headache, no backache and no apparent nausea or vomiting Anesthetic complications: no   No complications documented.  Last Vitals:  Vitals:   09/09/20 0013 09/09/20 0415  BP: 110/64 128/89  Pulse: 90 99  Resp: 16 16  Temp: (!) 36.4 C 36.7 C  SpO2: 99% 100%    Last Pain:  Vitals:   09/09/20 0459  TempSrc:   PainSc: 5                  Toniya Rozar S

## 2020-09-09 NOTE — Evaluation (Signed)
Physical Therapy Evaluation Patient Details Name: [name] MRN: [id number] DOB: Sep 24, 1955 Today's Date: 09/09/2020   History of Present Illness  65 y.o. famale admitted for L TKA on 09/08/20. PMH includes HTN, SBO, traumatic injury to back, asthma, shoulder surgery, THA  Clinical Impression  Pt ambulated 200' with RW, completed stair training, and demonstrates good understanding of HEP. She is ready to DC home from PT standpoint. Pt puts forth good effort and is very motivated to progress with mobility.    Follow Up Recommendations Outpatient PT;Follow surgeon's recommendation for DC plan and follow-up therapies    Equipment Recommendations  None recommended by PT    Recommendations for Other Services       Precautions / Restrictions Precautions Precautions: Knee;Fall Precaution Booklet Issued: Yes (comment) Precaution Comments: reviewed no pillow under knee Restrictions Weight Bearing Restrictions: No Other Position/Activity Restrictions: WBAT      Mobility  Bed Mobility Overal bed mobility: Modified Independent             General bed mobility comments: HOB up, used rail    Transfers Overall transfer level: Needs assistance Equipment used: Rolling walker (2 wheeled) Transfers: Sit to/from Stand Sit to Stand: Supervision         General transfer comment: VCs hand placement  Ambulation/Gait Ambulation/Gait assistance: Supervision Gait Distance (Feet): 200 Feet Assistive device: Rolling walker (2 wheeled) Gait Pattern/deviations: Step-to pattern;Decreased step length - right;Decreased step length - left Gait velocity: decr   General Gait Details: steady, no LOB, good sequencing  Stairs Stairs: Yes Stairs assistance: Min assist Stair Management: One rail Right;Step to pattern;Forwards Number of Stairs: 3 General stair comments: min A to manage RW, VCs sequencing  Wheelchair Mobility    Modified Rankin (Stroke Patients Only)        Balance Overall balance assessment: Modified Independent                                           Pertinent Vitals/Pain Pain Assessment: 0-10 Pain Score: 8  Pain Location: L knee and back Pain Descriptors / Indicators: Sore;Sharp Pain Intervention(s): Limited activity within patient's tolerance;Monitored during session;Premedicated before session;Ice applied    Home Living Family/patient expects to be discharged to:: Private residence Living Arrangements: Alone Available Help at Discharge: Family   Home Access: Stairs to enter Entrance Stairs-Rails: Left Entrance Stairs-Number of Steps: 3 Home Layout: One level Home Equipment: Environmental consultant - 2 wheels;Cane - single point Additional Comments: daughter was going to assist pt at home but she just tested positive for flu. Pt reports she'll have transportation to OPPT thru [facility], and will have meals delivered. Friend to transport her home.    Prior Function Level of Independence: Independent with assistive device(s)         Comments: walked with SPC     Hand Dominance        Extremity/Trunk Assessment   Upper Extremity Assessment Upper Extremity Assessment: Overall WFL for tasks assessed    Lower Extremity Assessment Lower Extremity Assessment: LLE deficits/detail LLE Deficits / Details: SLR 3/5, knee AAROM 5-45* LLE Sensation: WNL LLE Coordination: WNL    Cervical / Trunk Assessment Cervical / Trunk Assessment: Normal  Communication   Communication: No difficulties  Cognition Arousal/Alertness: Awake/alert Behavior During Therapy: WFL for tasks assessed/performed Overall Cognitive Status: Within Functional Limits for tasks assessed  General Comments      Exercises Total Joint Exercises Ankle Circles/Pumps: AROM;Both;10 reps;Supine Quad Sets: AROM;Left;5 reps;Supine Short Arc Quad: AROM;Left;5 reps;Supine Heel Slides: AAROM;10  reps;Supine;Left Hip ABduction/ADduction: AROM;Left;5 reps;Supine Straight Leg Raises: AROM;Left;5 reps;Supine Long Arc Quad: AROM;5 reps;Seated;Left Knee Flexion: AAROM;Left;10 reps;Seated Goniometric ROM: 3-85* AAROM L knee   Assessment/Plan    PT Assessment All further PT needs can be met in the next venue of care  PT Problem List Decreased strength;Decreased mobility;Decreased range of motion;Decreased activity tolerance;Pain;Decreased balance       PT Treatment Interventions      PT Goals (Current goals can be found in the Care Plan section)  Acute Rehab PT Goals Patient Stated Goal: rock climbing PT Goal Formulation: All assessment and education complete, DC therapy    Frequency     Barriers to discharge        Co-evaluation               AM-PAC PT "6 Clicks" Mobility  Outcome Measure Help needed turning from your back to your side while in a flat bed without using bedrails?: None Help needed moving from lying on your back to sitting on the side of a flat bed without using bedrails?: None Help needed moving to and from a bed to a chair (including a wheelchair)?: None Help needed standing up from a chair using your arms (e.g., wheelchair or bedside chair)?: None Help needed to walk in hospital room?: None Help needed climbing 3-5 steps with a railing? : A Little 6 Click Score: 23    End of Session Equipment Utilized During Treatment: Gait belt Activity Tolerance: Patient tolerated treatment well Patient left: in chair;with call bell/phone within reach;with chair alarm set Nurse Communication: Mobility status PT Visit Diagnosis: Muscle weakness (generalized) (M62.81);Difficulty in walking, not elsewhere classified (R26.2);Pain Pain - Right/Left: Left Pain - part of body: Knee    Time: 4166-0630 PT Time Calculation (min) (ACUTE ONLY): 47 min   Charges:   PT Evaluation $PT Eval Moderate Complexity: 1 Mod PT Treatments $Gait Training: 8-22  mins $Therapeutic Exercise: 8-22 mins       09/09/2020   Philomena Doheny PT 09/09/2020  Acute Rehabilitation Services Pager [phone] Office [phone]

## 2020-09-15 NOTE — Discharge Summary (Signed)
Physician Discharge Summary   Patient ID: [name] MRN: [id number] DOB/AGE: 12/07/1955 65 y.o.  Admit date: 09/08/2020 Discharge date: 09/09/2020  Primary Diagnosis: Osteoarthritis, left knee   Admission Diagnoses:  Past Medical History:  Diagnosis Date  . Arthritis   . Asthma   . Cancer ([place])    skin  . Cholecystitis   . GERD (gastroesophageal reflux disease)   . 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 History of hip replacement    Bilateral  . Hypertension   . Hypothyroidism   . Menopause   . Pancreatitis   . PONV (postoperative nausea and vomiting)   . Small bowel obstruction due to adhesions ([place])   . Traumatic injury of back    Discharge Diagnoses:   Principal Problem:   OA (osteoarthritis) of knee Active Problems:   Primary osteoarthritis of left knee  Estimated body mass index is 29.3 kg/m as calculated from the following:   Height as of this encounter: 5' (1.524 m).   Weight as of this encounter: 68 kg.  Procedure:  Procedure(s) (LRB): TOTAL KNEE ARTHROPLASTY (Left)   Consults: None  HPI: [name] is a 65 y.o. year old female with end stage OA of her left knee with progressively worsening pain and dysfunction. She has constant pain, with activity and at rest and significant functional deficits with difficulties even with ADLs. She has had extensive non-op management including analgesics, injections of cortisone and viscosupplements, and home exercise program, but remains in significant pain with significant dysfunction. Radiographs show bone on bone arthritis medial and patellofemoral. She presents now for left Total Knee Arthroplasty.    Laboratory Data: Admission on 09/08/2020, Discharged on 09/09/2020  Component Date Value Ref Range Status  . WBC 09/09/2020 10.8* 4.0 - 10.5 K/uL Final  . RBC 09/09/2020 4.06  3.87 - 5.11 MIL/uL Final  . Hemoglobin 09/09/2020 12.0  12.0 - 15.0 g/dL Final  . HCT 09/09/2020 36.9  36.0 - 46.0 % Final  . MCV 09/09/2020  90.9  80.0 - 100.0 fL Final  . MCH 09/09/2020 29.6  26.0 - 34.0 pg Final  . MCHC 09/09/2020 32.5  30.0 - 36.0 g/dL Final  . RDW 09/09/2020 13.5  11.5 - 15.5 % Final  . Platelets 09/09/2020 265  150 - 400 K/uL Final  . nRBC 09/09/2020 0.0  0.0 - 0.2 % Final   Performed at [facility], [place] [address] [place], [place] [address]  . Sodium 09/09/2020 138  135 - 145 mmol/L Final  . Potassium 09/09/2020 3.9  3.5 - 5.1 mmol/L Final  . Chloride 09/09/2020 100  98 - 111 mmol/L Final  . CO2 09/09/2020 29  22 - 32 mmol/L Final  . Glucose, Bld 09/09/2020 159* 70 - 99 mg/dL Final   Glucose reference range applies only to samples taken after fasting for at least 8 hours.  . BUN 09/09/2020 16  8 - 23 mg/dL Final  . Creatinine, Ser 09/09/2020 0.82  0.44 - 1.00 mg/dL Final  . Calcium 09/09/2020 8.2* 8.9 - 10.3 mg/dL Final  . GFR, Estimated 09/09/2020 >60  >60 mL/min Final   Comment: (NOTE) Calculated using the CKD-EPI Creatinine Equation (2021)   . Anion gap 09/09/2020 9  5 - 15 Final   Performed at [facility], [place] [address] [place], [place] [address]  Lab on 09/04/2020  Component Date Value Ref Range Status  . SARS-CoV-2, NAA 09/04/2020 Not Detected  Not Detected Final   Comment: This nucleic  acid amplification test was developed and its performance characteristics determined by [employer]. Nucleic acid amplification tests include RT-PCR and TMA. This test has not been FDA cleared or approved. This test has been authorized by FDA under an Emergency Use Authorization (EUA). This test is only authorized for the duration of time the declaration that circumstances exist justifying the authorization of the emergency use of in vitro diagnostic tests for detection of SARS-CoV-2 virus and/or diagnosis of COVID-19 infection under section 564(b)(1) of the Act, 21 U.S.C. 017CBS-4(H) (1), unless the authorization is terminated or revoked sooner. When  diagnostic testing is negative, the possibility of a false negative result should be considered in the context of a patient's recent exposures and the presence of clinical signs and symptoms consistent with COVID-19. An individual without symptoms of COVID-19 and who is not shedding SARS-CoV-2 virus wo                          uld expect to have a negative (not detected) result in this assay.   Marland Kitchen SARS-CoV-2, NAA 2 DAY TAT 09/04/2020 Performed   Final  Hospital Outpatient Visit on 09/02/2020  Component Date Value Ref Range Status  . MRSA, PCR 09/02/2020 NEGATIVE  NEGATIVE Final  . Staphylococcus aureus 09/02/2020 NEGATIVE  NEGATIVE Final   Comment: (NOTE) The Xpert SA Assay (FDA approved for NASAL specimens in patients 66 years of age and older), is one component of a comprehensive surveillance program. It is not intended to diagnose infection nor to guide or monitor treatment. Performed at [facility], [place] [address] [place], [place] [address]   . WBC 09/02/2020 7.9  4.0 - 10.5 K/uL Final  . RBC 09/02/2020 4.36  3.87 - 5.11 MIL/uL Final  . Hemoglobin 09/02/2020 12.9  12.0 - 15.0 g/dL Final  . HCT 09/02/2020 39.5  36.0 - 46.0 % Final  . MCV 09/02/2020 90.6  80.0 - 100.0 fL Final  . MCH 09/02/2020 29.6  26.0 - 34.0 pg Final  . MCHC 09/02/2020 32.7  30.0 - 36.0 g/dL Final  . RDW 09/02/2020 13.5  11.5 - 15.5 % Final  . Platelets 09/02/2020 263  150 - 400 K/uL Final  . nRBC 09/02/2020 0.0  0.0 - 0.2 % Final   Performed at [facility], [place] [address], [place], [place] [address]  . Sodium 09/02/2020 141  135 - 145 mmol/L Final  . Potassium 09/02/2020 3.6  3.5 - 5.1 mmol/L Final  . Chloride 09/02/2020 104  98 - 111 mmol/L Final  . CO2 09/02/2020 26  22 - 32 mmol/L Final  . Glucose, Bld 09/02/2020 106* 70 - 99 mg/dL Final   Glucose reference range applies only to samples taken after fasting for at least 8 hours.  . BUN 09/02/2020 22  8 - 23 mg/dL  Final  . Creatinine, Ser 09/02/2020 0.80  0.44 - 1.00 mg/dL Final  . Calcium 09/02/2020 8.7* 8.9 - 10.3 mg/dL Final  . GFR, Estimated 09/02/2020 >60  >60 mL/min Final   Comment: (NOTE) Calculated using the CKD-EPI Creatinine Equation (2021)   . Anion gap 09/02/2020 11  5 - 15 Final   Performed at [facility], [place] [address] [place], [place] [address]  . Transfuse no blood products 09/02/2020    Final                   Value:TRANSFUSE NO BLOOD PRODUCTS, VERIFIED BY GWEN NEAL,  RN  Performed at [facility], [place] [address] [place], [place] [address]      X-Rays:No results found.  EKG: Orders placed or performed during the hospital encounter of 09/02/20  . EKG 12 lead per protocol  . EKG 12 lead per protocol     Hospital Course: [name] is a 65 y.o. who was admitted to [facility]. They were brought to the operating room on 09/08/2020 and underwent Procedure(s): TOTAL KNEE ARTHROPLASTY.  Patient tolerated the procedure well and was later transferred to the recovery room and then to the orthopaedic floor for postoperative care. They were given PO and IV analgesics for pain control following their surgery. They were given 24 hours of postoperative antibiotics of  Anti-infectives (From admission, onward)   Start     Dose/Rate Route Frequency Ordered Stop   09/08/20 2200  ceFAZolin (ANCEF) IVPB 2g/100 mL premix        2 g 200 mL/hr over 30 Minutes Intravenous Every 6 hours 09/08/20 1830 09/09/20 0437   09/08/20 1215  ceFAZolin (ANCEF) IVPB 2g/100 mL premix        2 g 200 mL/hr over 30 Minutes Intravenous  Once 09/08/20 1201 09/08/20 1507     and started on DVT prophylaxis in the form of Aspirin.   PT and OT were ordered for total joint protocol. Discharge planning consulted to help with postop disposition and equipment needs.  Patient had a good night on the evening of surgery. They started to get up OOB with therapy on POD #0. Pt  was seen during rounds and was ready to go home pending progress with therapy. She worked with therapy on POD #1 and was meeting her goals. Pt was discharged to home later that day in stable condition.  Diet: Regular diet Activity: WBAT Follow-up: in 2 weeks Disposition: Home with OPPT Discharged Condition: stable   Discharge Instructions    Call MD / Call 911   Complete by: As directed    If you experience chest pain or shortness of breath, CALL 911 and be transported to the hospital emergency room.  If you develope a fever above 101 F, pus (white drainage) or increased drainage or redness at the wound, or calf pain, call your surgeon's office.   Change dressing   Complete by: As directed    You may remove the bulky bandage (ACE wrap and gauze) two days after surgery. You will have an adhesive waterproof bandage underneath. Leave this in place until your first follow-up appointment.   Constipation Prevention   Complete by: As directed    Drink plenty of fluids.  Prune juice may be helpful.  You may use a stool softener, such as Colace (over the counter) 100 mg twice a day.  Use MiraLax (over the counter) for constipation as needed.   Diet - low sodium heart healthy   Complete by: As directed    Do not put a pillow under the knee. Place it under the heel.   Complete by: As directed    Driving restrictions   Complete by: As directed    No driving for two weeks   Post-operative opioid taper instructions:   Complete by: As directed    POST-OPERATIVE OPIOID TAPER INSTRUCTIONS: It is important to wean off of your opioid medication as soon as possible. If you do not need pain medication after your surgery it is ok to stop day one. Opioids include: Codeine, Hydrocodone(Norco, Vicodin), Oxycodone(Percocet, oxycontin) and  hydromorphone amongst others.  Long term and even short term use of opiods can cause: Increased pain response Dependence Constipation Depression Respiratory  depression And more.  Withdrawal symptoms can include Flu like symptoms Nausea, vomiting And more Techniques to manage these symptoms Hydrate well Eat regular healthy meals Stay active Use relaxation techniques(deep breathing, meditating, yoga) Do Not substitute Alcohol to help with tapering If you have been on opioids for less than two weeks and do not have pain than it is ok to stop all together.  Plan to wean off of opioids This plan should start within one week post op of your joint replacement. Maintain the same interval or time between taking each dose and first decrease the dose.  Cut the total daily intake of opioids by one tablet each day Next start to increase the time between doses. The last dose that should be eliminated is the evening dose.      TED hose   Complete by: As directed    Use stockings (TED hose) for three weeks on both leg(s).  You may remove them at night for sleeping.   Weight bearing as tolerated   Complete by: As directed      Allergies as of 09/09/2020      Reactions   Lidocaine Nausea And Vomiting   Sulfa Antibiotics Hives   Sulfasalazine Hives, Rash   Amlodipine Swelling   Codeine Hives   Hydrocodone Nausea And Vomiting   Lisinopril Itching   Can take split doses-takes 10 in am and 10 in evening   Other    Refuse blood or blood products   Oxycodone Nausea And Vomiting   Prednisone    Causes her to have vaginal bleeding      Medication List    STOP taking these medications   meloxicam 15 MG tablet Commonly known as: MOBIC     TAKE these medications   ACETYLCYSTEINE PO Take 1 tablet by mouth daily.   albuterol 108 (90 Base) MCG/ACT inhaler Commonly known as: VENTOLIN HFA Inhale 1-2 puffs into the lungs every 6 (six) hours as needed for wheezing or shortness of breath.   Alfalfa 650 MG Tabs Take 1,300 mg by mouth in the morning and at bedtime.   aspirin 325 MG EC tablet Take 1 tablet (325 mg total) by mouth 2 (two) times  daily for 20 days. Then take one 81 mg aspirin once a day for three weeks. Then discontinue aspirin.   BIOTIN PO Take 1 tablet by mouth daily.   calcium citrate 950 (200 Ca) MG tablet Commonly known as: CALCITRATE - dosed in mg elemental calcium Take 400 mg of elemental calcium by mouth daily.   Co Q-10 300 MG Caps Take 300 mg by mouth daily.   Cod Liver Oil 1000 MG Caps Take 1,000 mg by mouth daily.   DEVILS CLAW PO Take 2 tablets by mouth daily.   DULoxetine 30 MG capsule Commonly known as: CYMBALTA Take 60 mg by mouth daily.   esomeprazole 20 MG capsule Commonly known as: NEXIUM Take 20 mg by mouth daily with breakfast.   estradiol 0.1 MG/24HR patch Commonly known as: VIVELLE-DOT Place 1 patch (0.1 mg total) onto the skin 2 (two) times a week. What changed: how much to take   Estradiol 10 MCG Tabs vaginal tablet Place 1 tablet (10 mcg total) vaginally 3 (three) times a week.   folic acid 106 MCG tablet Commonly known as: FOLVITE Take 400 mcg by mouth daily.  glucose blood test strip 1 each by Other route as needed for other. Use as instructed   GLUTATHIONE PO Take 2 tablets by mouth daily.   HYDROmorphone 2 MG tablet Commonly known as: DILAUDID Take 1-2 tablets (2-4 mg total) by mouth every 6 (six) hours as needed for moderate pain or severe pain.   L-Arginine 1000 MG Tabs Take 1,000 mg by mouth daily.   lisinopril 10 MG tablet Commonly known as: ZESTRIL Take 10 mg by mouth in the morning and at bedtime.   MAGNESIUM CITRATE PO Take 1 tablet by mouth in the morning and at bedtime.   methocarbamol 500 MG tablet Commonly known as: ROBAXIN Take 1 tablet (500 mg total) by mouth every 6 (six) hours as needed for muscle spasms.   montelukast 10 MG tablet Commonly known as: SINGULAIR Take 10 mg by mouth daily as needed (shortness of breath).   Niacin Powd Take 100 mg by mouth daily.   NIACINAMIDE PO Take 1 tablet by mouth daily.   OVER THE COUNTER  MEDICATION Take 1 tablet by mouth daily. Beet root   pregabalin 50 MG capsule Commonly known as: LYRICA Take 50 mg by mouth at bedtime.   QUERCETIN PO Take 1 tablet by mouth daily.   rOPINIRole 1 MG tablet Commonly known as: REQUIP Take 2 mg by mouth at bedtime.   sucralfate 1 g tablet Commonly known as: CARAFATE Take by mouth.   TAURINE PO Take 1 tablet by mouth daily.   thyroid 90 MG tablet Commonly known as: ARMOUR Take 90 mg by mouth daily before breakfast.   vitamin B-12 1000 MCG tablet Commonly known as: CYANOCOBALAMIN Take 1,000 mcg by mouth daily.   Vitamin D3 250 MCG (10000 UT) capsule Take 10,000 Units by mouth daily.   vitamin E 1000 UNIT capsule Take 1,000 Units by mouth daily.   zinc gluconate 50 MG tablet Take 50 mg by mouth daily.            Discharge Care Instructions  (From admission, onward)         Start     Ordered   09/09/20 0000  Weight bearing as tolerated        09/09/20 0745   09/09/20 0000  Change dressing       Comments: You may remove the bulky bandage (ACE wrap and gauze) two days after surgery. You will have an adhesive waterproof bandage underneath. Leave this in place until your first follow-up appointment.   09/09/20 0745          Follow-up Information    Gaynelle Arabian, MD. Schedule an appointment as soon as possible for a visit on 09/23/2020.   Specialty: Orthopedic Surgery Contact information: [address] [place] [address] [phone]               Signed: Theresa Duty, PA-C Orthopedic Surgery 09/15/2020, 7:34 AM

## 2020-10-13 ENCOUNTER — Other Ambulatory Visit (HOSPITAL_COMMUNITY): Payer: No Typology Code available for payment source

## 2020-12-05 DIAGNOSIS — Z0000 Encounter for general adult medical examination without abnormal findings: Secondary | ICD-10-CM | POA: Diagnosis not present

## 2020-12-28 IMAGING — MG DIGITAL SCREENING BREAST BILAT IMPLANT W/ TOMO W/ CAD
8 of 16 series · 8 of 40 positions shown · non-contrast
Comparison: Previous exam(s).

CLINICAL DATA: Screening.

EXAM:
DIGITAL SCREENING BILATERAL MAMMOGRAM WITH IMPLANTS, CAD AND TOMO
The patient has retroglandular implants. Standard and implant
displaced views were performed.

[L CC]
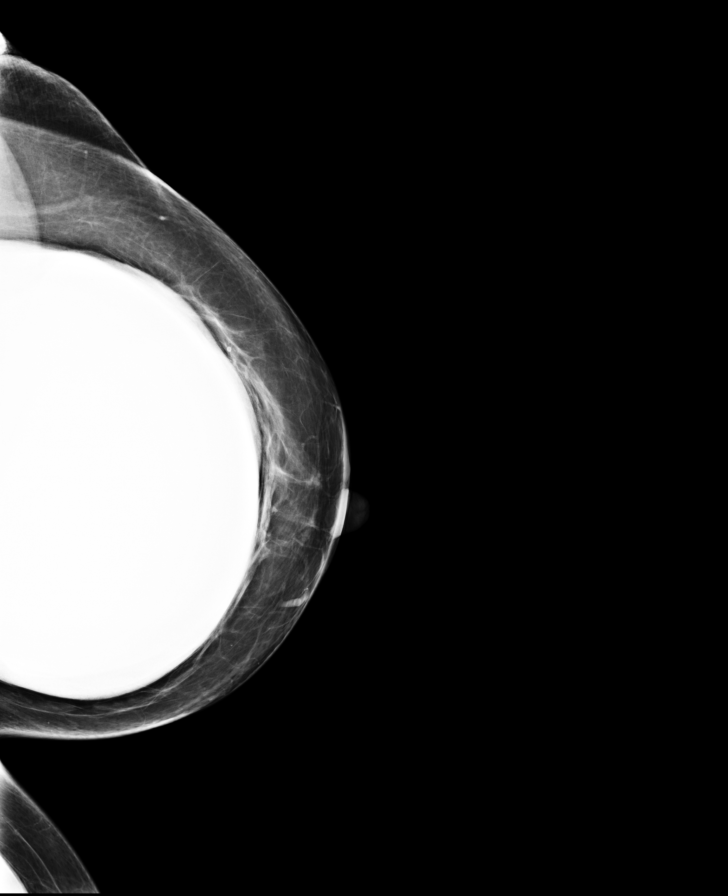

[L MLO]
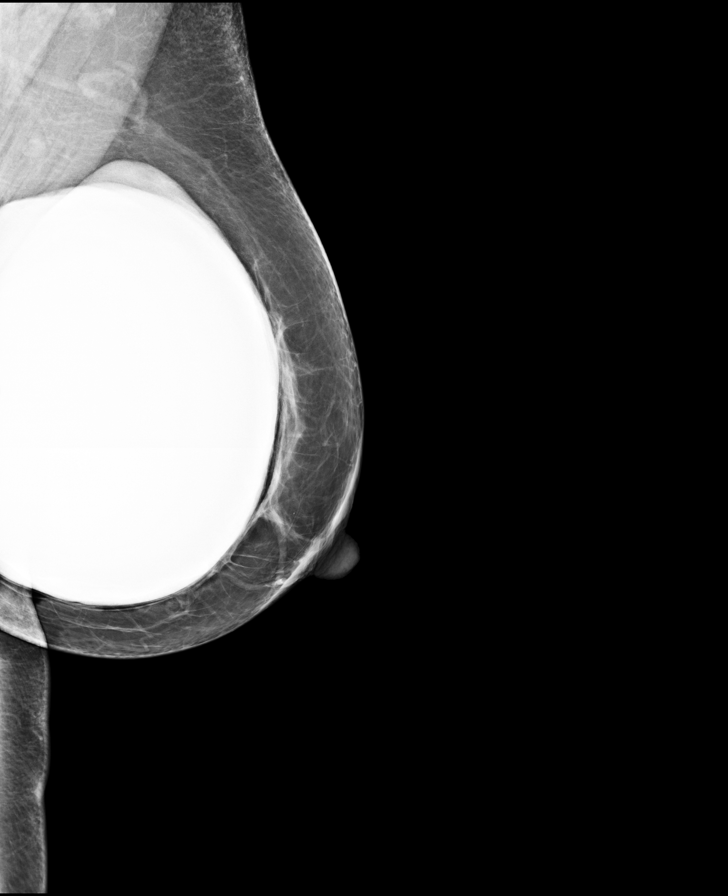

[R CC]
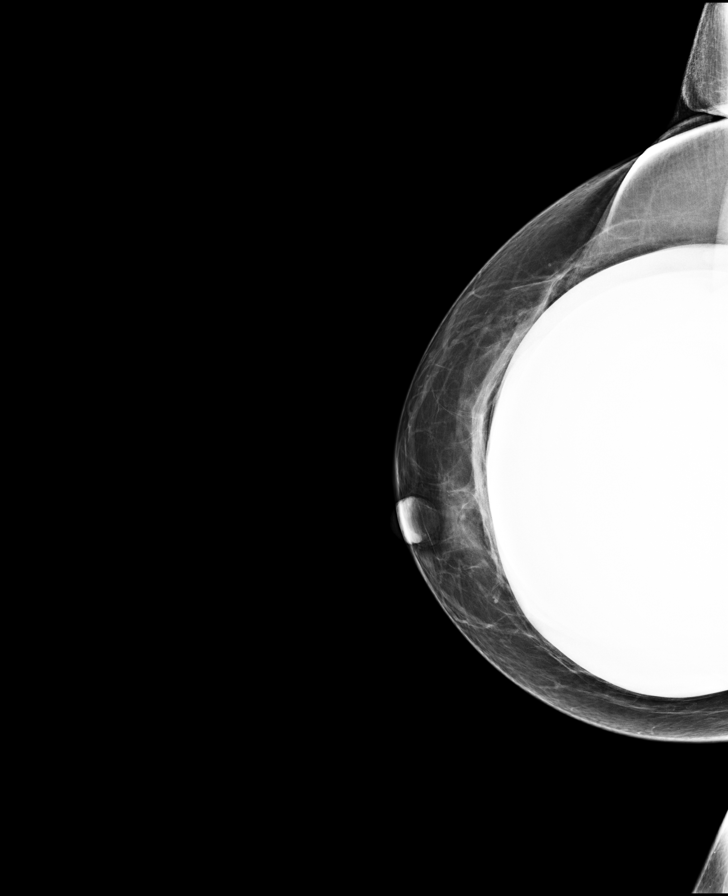

[R MLO]
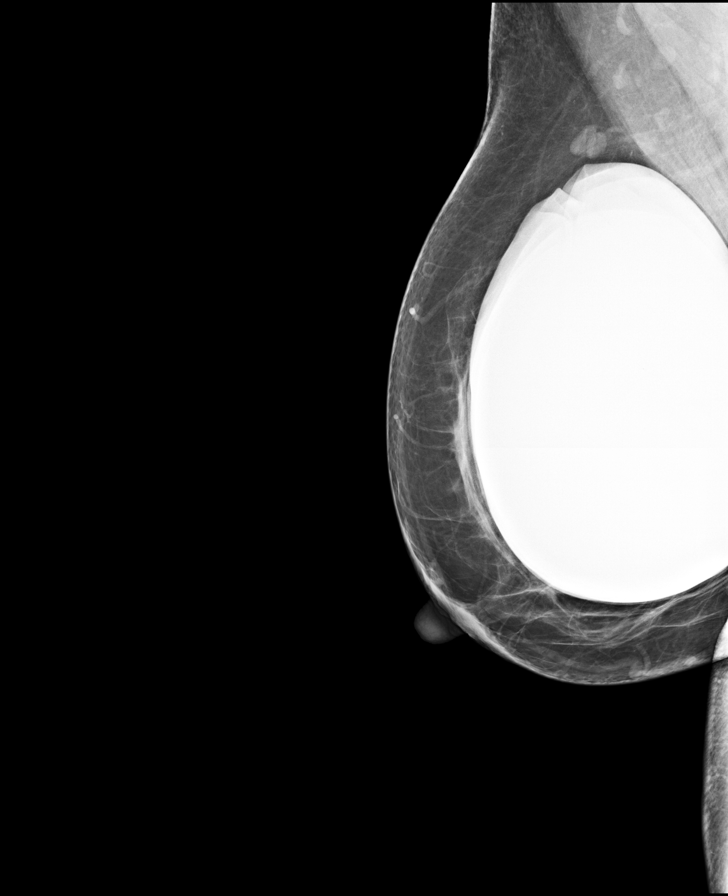

[R CC synth-2D (1 of 2)]
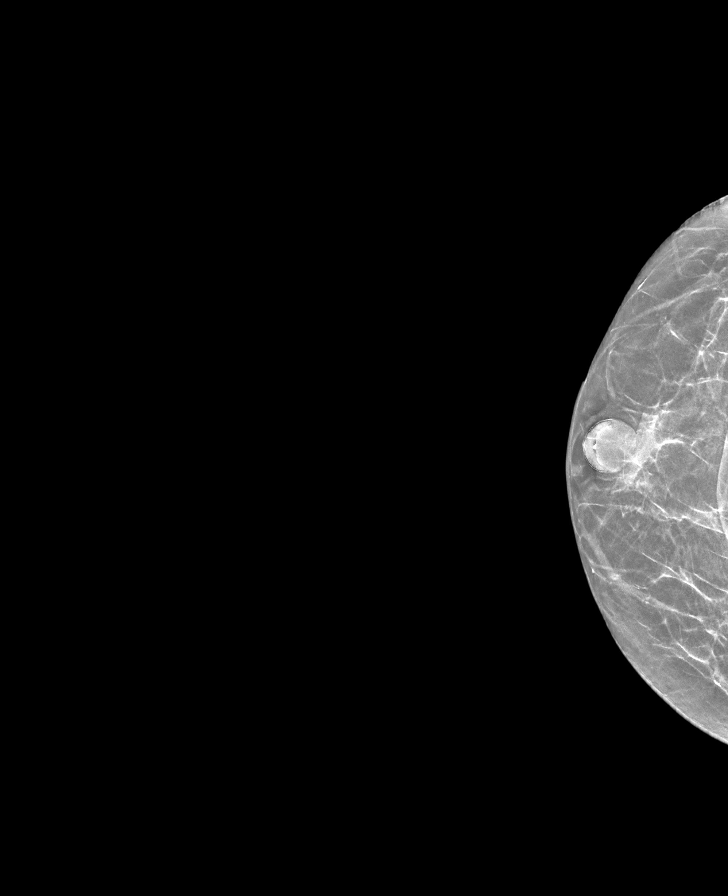

[L CC synth-2D]
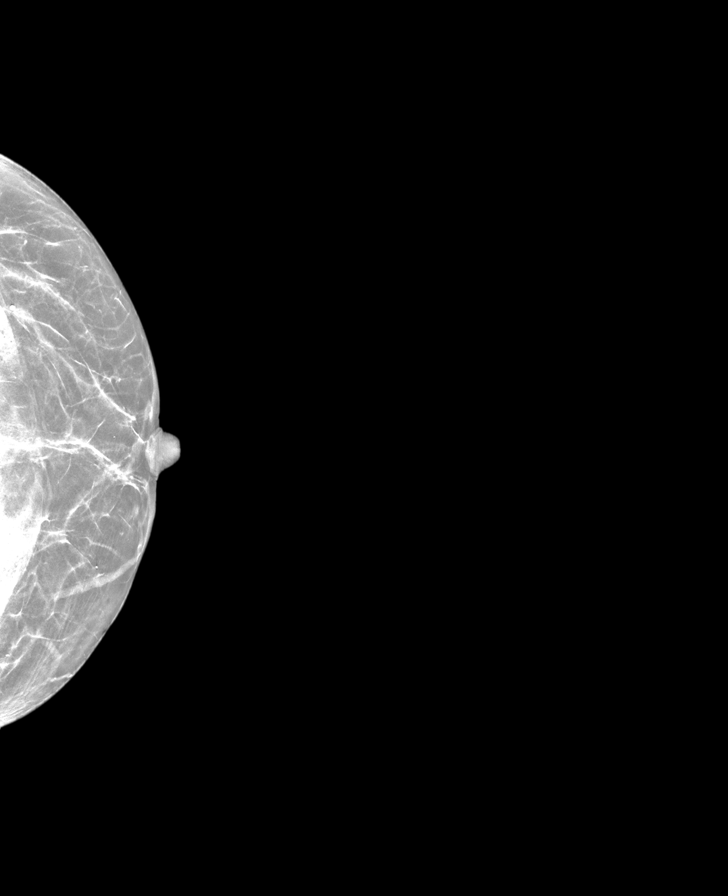

[R MLO synth-2D]
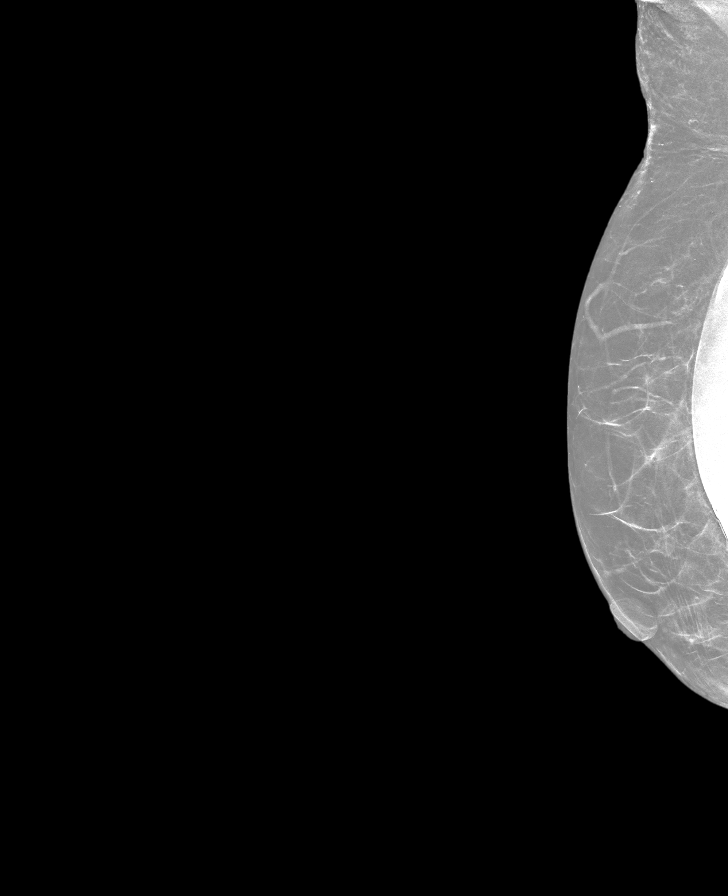

[R CC synth-2D (2 of 2)]
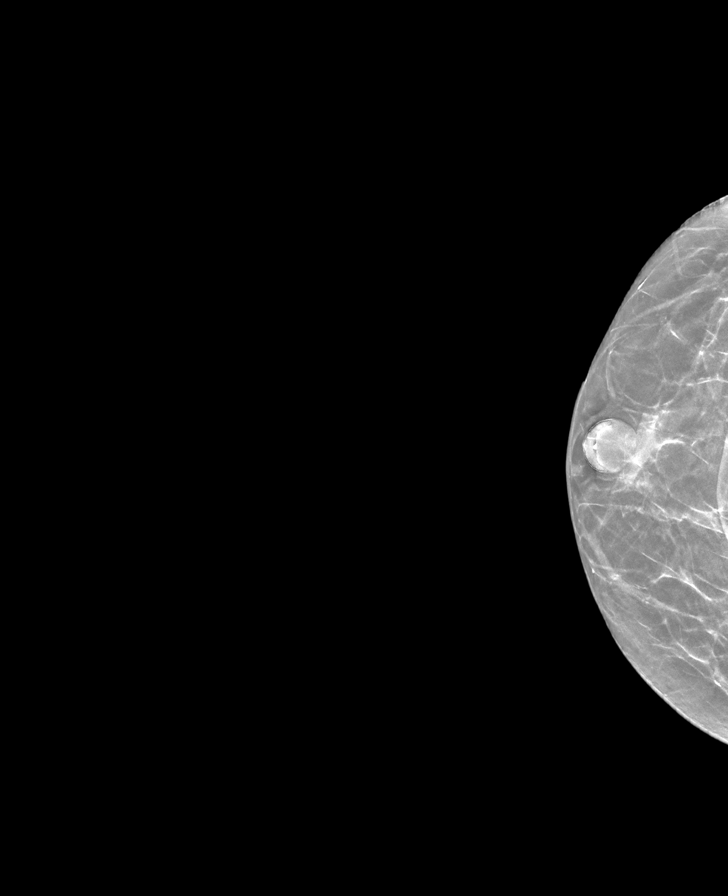

[8 of 40 positions shown; findings below may reference images not displayed]

ACR Breast Density Category b: There are scattered areas of
fibroglandular density.
FINDINGS: There are no findings suspicious for malignancy. Images were
processed with CAD.

New focal lucencies are identified within the LEFT implant, raising
the question of implant rupture.
IMPRESSION: No mammographic evidence of malignancy. A result letter of this
screening mammogram will be mailed directly to the patient.

Possible LEFT implant rupture.

RECOMMENDATION:
Screening mammogram in one year. (Code:[id number])

Consider further evaluation with plastic surgery referral and MRI.

BI-RADS CATEGORY  1:  Negative.

## 2021-04-07 DIAGNOSIS — L570 Actinic keratosis: Secondary | ICD-10-CM | POA: Diagnosis not present

## 2021-04-07 DIAGNOSIS — L905 Scar conditions and fibrosis of skin: Secondary | ICD-10-CM | POA: Diagnosis not present

## 2021-04-07 DIAGNOSIS — L298 Other pruritus: Secondary | ICD-10-CM | POA: Diagnosis not present

## 2021-04-07 DIAGNOSIS — L308 Other specified dermatitis: Secondary | ICD-10-CM | POA: Diagnosis not present

## 2021-04-07 DIAGNOSIS — Z85828 Personal history of other malignant neoplasm of skin: Secondary | ICD-10-CM | POA: Diagnosis not present

## 2021-04-07 DIAGNOSIS — L578 Other skin changes due to chronic exposure to nonionizing radiation: Secondary | ICD-10-CM | POA: Diagnosis not present

## 2021-04-07 DIAGNOSIS — Z08 Encounter for follow-up examination after completed treatment for malignant neoplasm: Secondary | ICD-10-CM | POA: Diagnosis not present

## 2021-04-20 ENCOUNTER — Other Ambulatory Visit: Payer: Self-pay

## 2021-04-20 ENCOUNTER — Ambulatory Visit (INDEPENDENT_AMBULATORY_CARE_PROVIDER_SITE_OTHER): Payer: Medicare (Managed Care) | Admitting: Obstetrics & Gynecology

## 2021-04-20 ENCOUNTER — Encounter: Payer: Self-pay | Admitting: Obstetrics & Gynecology

## 2021-04-20 ENCOUNTER — Telehealth: Payer: Self-pay | Admitting: *Deleted

## 2021-04-20 ENCOUNTER — Other Ambulatory Visit (HOSPITAL_COMMUNITY)
Admission: RE | Admit: 2021-04-20 | Discharge: 2021-04-20 | Disposition: A | Discharge status: Home / Self Care | Payer: Medicare (Managed Care) | Source: Ambulatory Visit | Attending: Obstetrics & Gynecology | Admitting: Obstetrics & Gynecology

## 2021-04-20 VITALS — BP 141/92 | HR 96 | Ht 63.0 in | Wt 152.0 lb

## 2021-04-20 DIAGNOSIS — N898 Other specified noninflammatory disorders of vagina: Secondary | ICD-10-CM | POA: Insufficient documentation

## 2021-04-20 MED ORDER — ESTRADIOL 10 MCG VA TABS: ORAL_TABLET | VAGINAL | 3 refills | Status: DC

## 2021-04-20 NOTE — Telephone Encounter (Signed)
Asked patient to bring needed red, white, and blue Medicare card to appointment along with insurance cards, patient agreed.

## 2021-04-21 LAB — CERVICOVAGINAL ANCILLARY ONLY
Bacterial Vaginitis (gardnerella): NEGATIVE
Candida Glabrata: NEGATIVE
Candida Vaginitis: NEGATIVE
Comment: NEGATIVE
Comment: NEGATIVE
Comment: NEGATIVE

## 2021-04-27 NOTE — Progress Notes (Signed)
° °  Subjective:    Patient ID: [name], female    DOB: 1956/01/23, 65 y.o.   MRN: [id number]  HPI  [name] is a 65 year old female who presents for ongoing vaginal dryness.  Patient is on Vagifem 3 times a week.  She does not take Replens for what is she is perceives as a high cost.  She does use a silicone based lubricant at time of intercourse.  Patient has had hysterectomy and your gynecological procedures done by urogynecology at [facility] since I last saw her.   Review of Systems  Constitutional: Negative.   Respiratory: Negative.    Cardiovascular: Negative.   Gastrointestinal: Negative.   Genitourinary: Negative.        Vaginal dryness (uses vagifem 3x a week)      Objective:   Physical Exam Vitals reviewed.  Constitutional:      General: She is not in acute distress.    Appearance: She is well-developed.  HENT:     Head: Normocephalic and atraumatic.  Eyes:     Conjunctiva/sclera: Conjunctivae normal.  Cardiovascular:     Rate and Rhythm: Normal rate.  Pulmonary:     Effort: Pulmonary effort is normal.  Genitourinary:    Comments: Tanner V Vulva:  No lesion Vagina:  Pale pink, no lesions, no discharge, no blood; to atrophic vaginitis Cervix:  Surgically absent Uterus:  Surgically absent  Skin:    General: Skin is warm and dry.  Neurological:     Mental Status: She is alert and oriented to person, place, and time.  Psychiatric:        Mood and Affect: Mood normal.   Vitals:   04/20/21 1337  BP: (!) 141/92  Pulse: 96  Weight: 152 lb (68.9 kg)  Height: 5\' 3"  (1.6 m)   Assessment & Plan:  65 yo female presents for vaginal dryness  two course of daily vagifem and return to 3x a week 2.  Replens--pulled up web site to show product and evaluate cost.  Pt was under the impression that it was expensive. 3.  Continue use of silicone based lubricant during intercourse.  31 minutes spent during encounter including review of records from [facility] (recent surgery  and complications), time during history and physical, counseling regarding treatments of vaginal dryness, and documentation.

## 2021-07-14 ENCOUNTER — Other Ambulatory Visit: Payer: Self-pay | Admitting: *Deleted

## 2021-07-14 DIAGNOSIS — Z08 Encounter for follow-up examination after completed treatment for malignant neoplasm: Secondary | ICD-10-CM | POA: Diagnosis not present

## 2021-07-14 DIAGNOSIS — N951 Menopausal and female climacteric states: Secondary | ICD-10-CM

## 2021-07-14 DIAGNOSIS — L570 Actinic keratosis: Secondary | ICD-10-CM | POA: Diagnosis not present

## 2021-07-14 DIAGNOSIS — L814 Other melanin hyperpigmentation: Secondary | ICD-10-CM | POA: Diagnosis not present

## 2021-07-14 DIAGNOSIS — L821 Other seborrheic keratosis: Secondary | ICD-10-CM | POA: Diagnosis not present

## 2021-07-14 DIAGNOSIS — Z85828 Personal history of other malignant neoplasm of skin: Secondary | ICD-10-CM | POA: Diagnosis not present

## 2021-07-14 MED ORDER — ESTRADIOL 0.1 MG/24HR TD PTTW: 1.0000 | MEDICATED_PATCH | TRANSDERMAL | 3 refills | Status: DC

## 2021-07-14 NOTE — Telephone Encounter (Signed)
RF request sent to [facility] in [place] for Vivelle Dot.  90 day supply sent per Dr Gala Romney. ?

## 2021-08-11 ENCOUNTER — Other Ambulatory Visit: Payer: Self-pay | Admitting: Rehabilitation

## 2021-08-11 DIAGNOSIS — M4802 Spinal stenosis, cervical region: Secondary | ICD-10-CM

## 2021-08-17 DIAGNOSIS — Z0000 Encounter for general adult medical examination without abnormal findings: Secondary | ICD-10-CM | POA: Diagnosis not present

## 2021-08-24 ENCOUNTER — Ambulatory Visit: Payer: No Typology Code available for payment source | Admitting: Obstetrics & Gynecology

## 2021-09-04 ENCOUNTER — Ambulatory Visit
Admission: RE | Admit: 2021-09-04 | Discharge: 2021-09-04 | Disposition: A | Discharge status: Home / Self Care | Payer: No Typology Code available for payment source | Source: Ambulatory Visit | Attending: Rehabilitation | Admitting: Rehabilitation

## 2021-09-04 DIAGNOSIS — M4802 Spinal stenosis, cervical region: Secondary | ICD-10-CM

## 2021-09-04 MED ORDER — GADOBENATE DIMEGLUMINE 529 MG/ML IV SOLN
14.0000 mL | Freq: Once | INTRAVENOUS | Status: AC | PRN
  Administered 2021-09-04: 14 mL via INTRAVENOUS

## 2021-09-07 ENCOUNTER — Other Ambulatory Visit: Payer: Self-pay | Admitting: Obstetrics & Gynecology

## 2021-09-07 ENCOUNTER — Ambulatory Visit (INDEPENDENT_AMBULATORY_CARE_PROVIDER_SITE_OTHER): Payer: Medicare (Managed Care) | Admitting: Obstetrics & Gynecology

## 2021-09-07 ENCOUNTER — Encounter: Payer: Self-pay | Admitting: Obstetrics & Gynecology

## 2021-09-07 ENCOUNTER — Other Ambulatory Visit (HOSPITAL_COMMUNITY)
Admission: RE | Admit: 2021-09-07 | Discharge: 2021-09-07 | Disposition: A | Discharge status: Home / Self Care | Payer: Medicare (Managed Care) | Source: Ambulatory Visit | Attending: Obstetrics & Gynecology | Admitting: Obstetrics & Gynecology

## 2021-09-07 VITALS — BP 185/101 | HR 97 | Ht 63.0 in | Wt 149.0 lb

## 2021-09-07 DIAGNOSIS — Z1151 Encounter for screening for human papillomavirus (HPV): Secondary | ICD-10-CM | POA: Insufficient documentation

## 2021-09-07 DIAGNOSIS — N951 Menopausal and female climacteric states: Secondary | ICD-10-CM

## 2021-09-07 DIAGNOSIS — Z01419 Encounter for gynecological examination (general) (routine) without abnormal findings: Secondary | ICD-10-CM | POA: Diagnosis not present

## 2021-09-07 DIAGNOSIS — Z1231 Encounter for screening mammogram for malignant neoplasm of breast: Secondary | ICD-10-CM

## 2021-09-07 NOTE — Progress Notes (Signed)
Subjective:  ?  ? [name] is a 66 y.o. female here for a routine exam.  Current complaints: none.  Not sexually active at this time so not using hte replens or vagifem.  Pt had hysterectomy for recurrent CIN 2, POP, cystocele, rectocele and mixed incontince (Dr. Zigmund [facility]). ?  ?Gynecologic History ?No LMP recorded. Patient is postmenopausal. ?Contraception: status post hysterectomy ?Last Pap: CIN 2 at time of hysterectomy ?Last mammogram: 2021. Results were: normal ? ?Obstetric History ?OB History  ?Gravida Para Term Preterm AB Living  ?'3 3 3     3  '$ ?SAB IAB Ectopic Multiple Live Births  ?           ?  ?# Outcome Date GA Lbr Len/2nd Weight Sex Delivery Anes PTL Lv  ?3 Term      CS-Unspec     ?2 Term           ?1 Term      CS-Unspec     ? ? ? ?The following portions of the patient's history were reviewed and updated as appropriate: allergies, current medications, past family history, past medical history, past social history, past surgical history, and problem list. ? ?Review of Systems ?Pertinent items noted in HPI and remainder of comprehensive ROS otherwise negative.  ?  ?Objective:  ? ?  ?Vitals:  ? 09/07/21 1551  ?BP: (!) 185/101  ?Pulse: 97  ?Weight: 149 lb (67.6 kg)  ?Height: '5\' 3"'$  (1.6 m)  ? ?Vitals:  WNL ?General appearance: alert, cooperative and no distress ? ?HEENT: Normocephalic, without obvious abnormality, atraumatic ?Eyes: negative ?Throat: lips, mucosa, and tongue normal; teeth and gums normal  ?Respiratory: Clear to auscultation bilaterally  ?CV: Regular rate and rhythm  ?Breasts:  Normal appearance, no masses or tenderness, no nipple retraction or dimpling  ?GI: Soft, non-tender; bowel sounds normal; no masses,  no organomegaly  ?GU: External Genitalia:  Tanner V, no lesion ?Urethra:  No prolapse   ?Vagina: Pink, normal rugae, no blood or discharge  ?Cervix: Surgically absent; cuff normal   ?Uterus:  Surgically absent  ?Adnexa: no masses, non tender  ?Musculoskeletal: No edema,  redness or tenderness in the calves or thighs  ?Skin: No lesions or rash  ?Lymphatic: Axillary adenopathy: none     ?Psychiatric: Normal mood and behavior  ? ? ?   ? ?Assessment:  ? ? Healthy female exam.  ?  ?Plan:  ? ? HTN=--Pt needs to f/u with PCP if BP doesn't go down once taken BP meds (did not take today) ?Pap smear as hyst was done for recurrent CIN 2 ?Yearly mammograms ?Pt opts for cologuard (orderd by [place]) ?Continue 1/2 patch twice a week for menopausal symptoms.   ?

## 2021-09-09 LAB — CYTOLOGY - PAP
Adequacy: ABSENT
Comment: NEGATIVE
High risk HPV: NEGATIVE

## 2021-10-08 ENCOUNTER — Ambulatory Visit: Payer: No Typology Code available for payment source

## 2021-11-30 ENCOUNTER — Other Ambulatory Visit: Payer: Self-pay

## 2021-11-30 ENCOUNTER — Inpatient Hospital Stay (HOSPITAL_COMMUNITY)
Admission: EM | Admit: 2021-11-30 | Discharge: 2021-12-04 | DRG: 872 | Disposition: A | Discharge status: Home / Self Care | Payer: No Typology Code available for payment source | Attending: Internal Medicine | Admitting: Internal Medicine

## 2021-11-30 ENCOUNTER — Encounter (HOSPITAL_COMMUNITY): Payer: Self-pay

## 2021-11-30 DIAGNOSIS — M1990 Unspecified osteoarthritis, unspecified site: Secondary | ICD-10-CM | POA: Diagnosis present

## 2021-11-30 DIAGNOSIS — Z79899 Other long term (current) drug therapy: Secondary | ICD-10-CM

## 2021-11-30 DIAGNOSIS — K7689 Other specified diseases of liver: Secondary | ICD-10-CM | POA: Diagnosis present

## 2021-11-30 DIAGNOSIS — Z888 Allergy status to other drugs, medicaments and biological substances status: Secondary | ICD-10-CM

## 2021-11-30 DIAGNOSIS — R0902 Hypoxemia: Secondary | ICD-10-CM | POA: Diagnosis not present

## 2021-11-30 DIAGNOSIS — Z884 Allergy status to anesthetic agent status: Secondary | ICD-10-CM

## 2021-11-30 DIAGNOSIS — I10 Essential (primary) hypertension: Secondary | ICD-10-CM | POA: Diagnosis present

## 2021-11-30 DIAGNOSIS — A419 Sepsis, unspecified organism: Secondary | ICD-10-CM | POA: Diagnosis not present

## 2021-11-30 DIAGNOSIS — K5732 Diverticulitis of large intestine without perforation or abscess without bleeding: Secondary | ICD-10-CM | POA: Diagnosis present

## 2021-11-30 DIAGNOSIS — R42 Dizziness and giddiness: Secondary | ICD-10-CM | POA: Diagnosis not present

## 2021-11-30 DIAGNOSIS — I959 Hypotension, unspecified: Secondary | ICD-10-CM

## 2021-11-30 DIAGNOSIS — B957 Other staphylococcus as the cause of diseases classified elsewhere: Secondary | ICD-10-CM | POA: Diagnosis present

## 2021-11-30 DIAGNOSIS — K625 Hemorrhage of anus and rectum: Secondary | ICD-10-CM | POA: Diagnosis not present

## 2021-11-30 DIAGNOSIS — K219 Gastro-esophageal reflux disease without esophagitis: Secondary | ICD-10-CM | POA: Diagnosis present

## 2021-11-30 DIAGNOSIS — I700 Atherosclerosis of aorta: Secondary | ICD-10-CM | POA: Diagnosis present

## 2021-11-30 DIAGNOSIS — E890 Postprocedural hypothyroidism: Secondary | ICD-10-CM | POA: Diagnosis present

## 2021-11-30 DIAGNOSIS — Z885 Allergy status to narcotic agent status: Secondary | ICD-10-CM

## 2021-11-30 DIAGNOSIS — Z635 Disruption of family by separation and divorce: Secondary | ICD-10-CM

## 2021-11-30 DIAGNOSIS — K5792 Diverticulitis of intestine, part unspecified, without perforation or abscess without bleeding: Principal | ICD-10-CM

## 2021-11-30 DIAGNOSIS — J45909 Unspecified asthma, uncomplicated: Secondary | ICD-10-CM | POA: Diagnosis present

## 2021-11-30 DIAGNOSIS — Z79890 Hormone replacement therapy (postmenopausal): Secondary | ICD-10-CM

## 2021-11-30 DIAGNOSIS — Z9882 Breast implant status: Secondary | ICD-10-CM

## 2021-11-30 DIAGNOSIS — Z882 Allergy status to sulfonamides status: Secondary | ICD-10-CM

## 2021-11-30 DIAGNOSIS — Z96652 Presence of left artificial knee joint: Secondary | ICD-10-CM | POA: Diagnosis present

## 2021-11-30 DIAGNOSIS — Z90710 Acquired absence of both cervix and uterus: Secondary | ICD-10-CM

## 2021-11-30 DIAGNOSIS — Z96643 Presence of artificial hip joint, bilateral: Secondary | ICD-10-CM | POA: Diagnosis present

## 2021-11-30 DIAGNOSIS — K649 Unspecified hemorrhoids: Secondary | ICD-10-CM | POA: Diagnosis present

## 2021-11-30 DIAGNOSIS — R55 Syncope and collapse: Secondary | ICD-10-CM | POA: Diagnosis not present

## 2021-11-30 NOTE — ED Triage Notes (Signed)
Pt BIB EMS for syncope after giving plasma today. Pt normally gives plasma but she started feeling dizzy w/ emesis this occurrence. For the past week pt has been feeling nauseous. Pt c/o of pain in lower abdomen.  80/60 96 hr 95 room air 123 cbg 2043m NS given

## 2021-12-01 ENCOUNTER — Emergency Department (HOSPITAL_COMMUNITY): Payer: No Typology Code available for payment source

## 2021-12-01 ENCOUNTER — Encounter (HOSPITAL_COMMUNITY): Payer: Self-pay

## 2021-12-01 DIAGNOSIS — K7689 Other specified diseases of liver: Secondary | ICD-10-CM | POA: Diagnosis present

## 2021-12-01 DIAGNOSIS — K5792 Diverticulitis of intestine, part unspecified, without perforation or abscess without bleeding: Secondary | ICD-10-CM | POA: Diagnosis present

## 2021-12-01 DIAGNOSIS — Z79899 Other long term (current) drug therapy: Secondary | ICD-10-CM | POA: Diagnosis not present

## 2021-12-01 DIAGNOSIS — A419 Sepsis, unspecified organism: Secondary | ICD-10-CM | POA: Diagnosis present

## 2021-12-01 DIAGNOSIS — Z888 Allergy status to other drugs, medicaments and biological substances status: Secondary | ICD-10-CM | POA: Diagnosis not present

## 2021-12-01 DIAGNOSIS — Z79890 Hormone replacement therapy (postmenopausal): Secondary | ICD-10-CM | POA: Diagnosis not present

## 2021-12-01 DIAGNOSIS — Z96652 Presence of left artificial knee joint: Secondary | ICD-10-CM | POA: Diagnosis present

## 2021-12-01 DIAGNOSIS — I700 Atherosclerosis of aorta: Secondary | ICD-10-CM | POA: Diagnosis present

## 2021-12-01 DIAGNOSIS — R1032 Left lower quadrant pain: Secondary | ICD-10-CM | POA: Diagnosis not present

## 2021-12-01 DIAGNOSIS — I959 Hypotension, unspecified: Secondary | ICD-10-CM | POA: Diagnosis not present

## 2021-12-01 DIAGNOSIS — K649 Unspecified hemorrhoids: Secondary | ICD-10-CM | POA: Diagnosis present

## 2021-12-01 DIAGNOSIS — K219 Gastro-esophageal reflux disease without esophagitis: Secondary | ICD-10-CM | POA: Diagnosis present

## 2021-12-01 DIAGNOSIS — Z884 Allergy status to anesthetic agent status: Secondary | ICD-10-CM | POA: Diagnosis not present

## 2021-12-01 DIAGNOSIS — Z885 Allergy status to narcotic agent status: Secondary | ICD-10-CM | POA: Diagnosis not present

## 2021-12-01 DIAGNOSIS — B957 Other staphylococcus as the cause of diseases classified elsewhere: Secondary | ICD-10-CM | POA: Diagnosis present

## 2021-12-01 DIAGNOSIS — Z882 Allergy status to sulfonamides status: Secondary | ICD-10-CM | POA: Diagnosis not present

## 2021-12-01 DIAGNOSIS — I10 Essential (primary) hypertension: Secondary | ICD-10-CM | POA: Diagnosis present

## 2021-12-01 DIAGNOSIS — K5732 Diverticulitis of large intestine without perforation or abscess without bleeding: Secondary | ICD-10-CM | POA: Diagnosis present

## 2021-12-01 DIAGNOSIS — Z90710 Acquired absence of both cervix and uterus: Secondary | ICD-10-CM | POA: Diagnosis not present

## 2021-12-01 DIAGNOSIS — Z635 Disruption of family by separation and divorce: Secondary | ICD-10-CM | POA: Diagnosis not present

## 2021-12-01 DIAGNOSIS — M1990 Unspecified osteoarthritis, unspecified site: Secondary | ICD-10-CM | POA: Diagnosis present

## 2021-12-01 DIAGNOSIS — E890 Postprocedural hypothyroidism: Secondary | ICD-10-CM | POA: Diagnosis present

## 2021-12-01 DIAGNOSIS — Z9882 Breast implant status: Secondary | ICD-10-CM | POA: Diagnosis not present

## 2021-12-01 DIAGNOSIS — K625 Hemorrhage of anus and rectum: Secondary | ICD-10-CM | POA: Diagnosis not present

## 2021-12-01 DIAGNOSIS — Z96643 Presence of artificial hip joint, bilateral: Secondary | ICD-10-CM | POA: Diagnosis present

## 2021-12-01 DIAGNOSIS — J45909 Unspecified asthma, uncomplicated: Secondary | ICD-10-CM | POA: Diagnosis present

## 2021-12-01 LAB — URINALYSIS, ROUTINE W REFLEX MICROSCOPIC
Bilirubin Urine: NEGATIVE
Glucose, UA: NEGATIVE mg/dL
Hgb urine dipstick: NEGATIVE
Ketones, ur: NEGATIVE mg/dL
Leukocytes,Ua: NEGATIVE
Nitrite: NEGATIVE
Protein, ur: NEGATIVE mg/dL
Specific Gravity, Urine: 1.011 (ref 1.005–1.030)
pH: 5 (ref 5.0–8.0)

## 2021-12-01 LAB — CBC WITH DIFFERENTIAL/PLATELET
Abs Immature Granulocytes: 0.35 10*3/uL — ABNORMAL HIGH (ref 0.00–0.07)
Basophils Absolute: 0 10*3/uL (ref 0.0–0.1)
Basophils Relative: 0 %
Eosinophils Absolute: 0.2 10*3/uL (ref 0.0–0.5)
Eosinophils Relative: 1 %
HCT: 36.4 % (ref 36.0–46.0)
Hemoglobin: 11.8 g/dL — ABNORMAL LOW (ref 12.0–15.0)
Immature Granulocytes: 2 %
Lymphocytes Relative: 16 %
Lymphs Abs: 2.5 10*3/uL (ref 0.7–4.0)
MCH: 30.5 pg (ref 26.0–34.0)
MCHC: 32.4 g/dL (ref 30.0–36.0)
MCV: 94.1 fL (ref 80.0–100.0)
Monocytes Absolute: 1.2 10*3/uL — ABNORMAL HIGH (ref 0.1–1.0)
Monocytes Relative: 8 %
Neutro Abs: 11.4 10*3/uL — ABNORMAL HIGH (ref 1.7–7.7)
Neutrophils Relative %: 73 %
Platelets: 213 10*3/uL (ref 150–400)
RBC: 3.87 MIL/uL (ref 3.87–5.11)
RDW: 13.2 % (ref 11.5–15.5)
WBC: 15.6 10*3/uL — ABNORMAL HIGH (ref 4.0–10.5)
nRBC: 0 % (ref 0.0–0.2)

## 2021-12-01 LAB — PROTIME-INR
INR: 1 (ref 0.8–1.2)
Prothrombin Time: 13.3 seconds (ref 11.4–15.2)

## 2021-12-01 LAB — COMPREHENSIVE METABOLIC PANEL
ALT: 10 U/L (ref 0–44)
AST: 12 U/L — ABNORMAL LOW (ref 15–41)
Albumin: 3 g/dL — ABNORMAL LOW (ref 3.5–5.0)
Alkaline Phosphatase: 41 U/L (ref 38–126)
Anion gap: 6 (ref 5–15)
BUN: 15 mg/dL (ref 8–23)
CO2: 25 mmol/L (ref 22–32)
Calcium: 7 mg/dL — ABNORMAL LOW (ref 8.9–10.3)
Chloride: 111 mmol/L (ref 98–111)
Creatinine, Ser: 0.9 mg/dL (ref 0.44–1.00)
GFR, Estimated: >60 mL/min (ref >60)
Glucose, Bld: 118 mg/dL — ABNORMAL HIGH (ref 70–99)
Potassium: 4.1 mmol/L (ref 3.5–5.1)
Sodium: 142 mmol/L (ref 135–145)
Total Bilirubin: 0.5 mg/dL (ref 0.3–1.2)
Total Protein: 5 g/dL — ABNORMAL LOW (ref 6.5–8.1)

## 2021-12-01 LAB — LACTIC ACID, PLASMA
Lactic Acid, Venous: 1 mmol/L (ref 0.5–1.9)
Lactic Acid, Venous: 0.8 mmol/L (ref 0.5–1.9)

## 2021-12-01 LAB — APTT: aPTT: 25 seconds (ref 24–36)

## 2021-12-01 LAB — TROPONIN I (HIGH SENSITIVITY)
Troponin I (High Sensitivity): 4 ng/L (ref <18)
Troponin I (High Sensitivity): 4 ng/L (ref <18)

## 2021-12-01 LAB — HIV ANTIBODY (ROUTINE TESTING W REFLEX): HIV Screen 4th Generation wRfx: NONREACTIVE

## 2021-12-01 MED ORDER — VITAMIN B-12 1000 MCG PO TABS
1000.0000 ug | ORAL_TABLET | Freq: Every day | ORAL | Status: DC
Start: 2021-12-01 — End: 2021-12-04
  Administered 2021-12-02 – 2021-12-04 (×3): 1000 ug via ORAL
  Filled 2021-12-01 (×4): qty 1

## 2021-12-01 MED ORDER — LACTATED RINGERS IV BOLUS
1000.0000 mL | Freq: Once | INTRAVENOUS | Status: AC
  Administered 2021-12-01: 1000 mL via INTRAVENOUS

## 2021-12-01 MED ORDER — OXYCODONE-ACETAMINOPHEN 5-325 MG PO TABS: 1.0000 | ORAL_TABLET | Freq: Four times a day (QID) | ORAL | Status: DC | PRN

## 2021-12-01 MED ORDER — FLUTICASONE PROPIONATE 50 MCG/ACT NA SUSP
1.0000 | Freq: Every day | NASAL | Status: AC
  Administered 2021-12-01 – 2021-12-02 (×2): 1 via NASAL
  Filled 2021-12-01: qty 16

## 2021-12-01 MED ORDER — ENOXAPARIN SODIUM 40 MG/0.4ML IJ SOSY
40.0000 mg | PREFILLED_SYRINGE | INTRAMUSCULAR | Status: DC
Start: 2021-12-01 — End: 2021-12-02
  Filled 2021-12-01 (×2): qty 0.4

## 2021-12-01 MED ORDER — PANTOPRAZOLE SODIUM 40 MG PO TBEC
40.0000 mg | DELAYED_RELEASE_TABLET | Freq: Every day | ORAL | Status: DC
  Administered 2021-12-01 – 2021-12-04 (×4): 40 mg via ORAL
  Filled 2021-12-01 (×4): qty 1

## 2021-12-01 MED ORDER — ONDANSETRON HCL 4 MG/2ML IJ SOLN
4.0000 mg | Freq: Four times a day (QID) | INTRAMUSCULAR | Status: DC | PRN
  Administered 2021-12-01 – 2021-12-02 (×3): 4 mg via INTRAVENOUS
  Filled 2021-12-01 (×3): qty 2

## 2021-12-01 MED ORDER — FOLIC ACID 400 MCG PO TABS: 400.0000 ug | ORAL_TABLET | Freq: Every day | ORAL | Status: DC

## 2021-12-01 MED ORDER — FOLIC ACID 1 MG PO TABS
0.5000 mg | ORAL_TABLET | Freq: Every day | ORAL | Status: DC
  Administered 2021-12-01 – 2021-12-04 (×4): 0.5 mg via ORAL
  Filled 2021-12-01 (×4): qty 1

## 2021-12-01 MED ORDER — LACTATED RINGERS IV SOLN
INTRAVENOUS | Status: DC
Start: 2021-12-01 — End: 2021-12-04

## 2021-12-01 MED ORDER — SODIUM CHLORIDE (PF) 0.9 % IJ SOLN
INTRAMUSCULAR | Status: AC
  Filled 2021-12-01: qty 50

## 2021-12-01 MED ORDER — THYROID 60 MG PO TABS: 90.0000 mg | ORAL_TABLET | Freq: Every day | ORAL | Status: DC

## 2021-12-01 MED ORDER — COD LIVER OIL 1000 MG PO CAPS
1000.0000 mg | ORAL_CAPSULE | Freq: Every day | ORAL | Status: DC
Start: 2021-12-01 — End: 2021-12-01

## 2021-12-01 MED ORDER — CALCIUM CITRATE 950 (200 CA) MG PO TABS
400.0000 mg | ORAL_TABLET | Freq: Every day | ORAL | Status: DC
  Administered 2021-12-02 – 2021-12-04 (×3): 400 mg via ORAL
  Filled 2021-12-01 (×4): qty 2

## 2021-12-01 MED ORDER — ROPINIROLE HCL 1 MG PO TABS
2.0000 mg | ORAL_TABLET | Freq: Every day | ORAL | Status: DC
  Administered 2021-12-01 – 2021-12-03 (×3): 2 mg via ORAL
  Filled 2021-12-01 (×3): qty 2

## 2021-12-01 MED ORDER — BOOST / RESOURCE BREEZE PO LIQD CUSTOM
1.0000 | Freq: Three times a day (TID) | ORAL | Status: DC
  Administered 2021-12-01 – 2021-12-04 (×6): 1 via ORAL

## 2021-12-01 MED ORDER — THYROID 30 MG PO TABS: 90.0000 mg | ORAL_TABLET | Freq: Every day | ORAL | Status: DC

## 2021-12-01 MED ORDER — MONTELUKAST SODIUM 10 MG PO TABS: 10.0000 mg | ORAL_TABLET | Freq: Every day | ORAL | Status: DC | PRN

## 2021-12-01 MED ORDER — ACETAMINOPHEN 650 MG RE SUPP: 650.0000 mg | Freq: Four times a day (QID) | RECTAL | Status: DC | PRN

## 2021-12-01 MED ORDER — THYROID 30 MG PO TABS
105.0000 mg | ORAL_TABLET | Freq: Every day | ORAL | Status: DC
Start: 2021-12-01 — End: 2021-12-04
  Administered 2021-12-01 – 2021-12-04 (×4): 105 mg via ORAL
  Filled 2021-12-01 (×4): qty 2

## 2021-12-01 MED ORDER — THYROID 30 MG PO TABS
15.0000 mg | ORAL_TABLET | Freq: Every day | ORAL | Status: DC
  Filled 2021-12-01: qty 1

## 2021-12-01 MED ORDER — ZINC SULFATE 220 (50 ZN) MG PO CAPS
220.0000 mg | ORAL_CAPSULE | Freq: Every day | ORAL | Status: DC
  Administered 2021-12-01 – 2021-12-04 (×4): 220 mg via ORAL
  Filled 2021-12-01 (×4): qty 1

## 2021-12-01 MED ORDER — CO Q-10 300 MG PO CAPS
300.0000 mg | ORAL_CAPSULE | Freq: Every day | ORAL | Status: DC
Start: 2021-12-01 — End: 2021-12-01

## 2021-12-01 MED ORDER — ACETAMINOPHEN 325 MG PO TABS: 650.0000 mg | ORAL_TABLET | Freq: Four times a day (QID) | ORAL | Status: DC | PRN

## 2021-12-01 MED ORDER — ROPINIROLE HCL 1 MG PO TABS
1.0000 mg | ORAL_TABLET | Freq: Every day | ORAL | Status: DC
  Administered 2021-12-01 – 2021-12-04 (×4): 1 mg via ORAL
  Filled 2021-12-01 (×4): qty 1

## 2021-12-01 MED ORDER — ZINC GLUCONATE 50 MG PO TABS: 50.0000 mg | ORAL_TABLET | Freq: Every day | ORAL | Status: DC

## 2021-12-01 MED ORDER — TRAMADOL HCL 50 MG PO TABS
50.0000 mg | ORAL_TABLET | Freq: Four times a day (QID) | ORAL | Status: DC | PRN
  Administered 2021-12-03 – 2021-12-04 (×2): 50 mg via ORAL
  Filled 2021-12-01 (×2): qty 1

## 2021-12-01 MED ORDER — ONDANSETRON HCL 4 MG PO TABS: 4.0000 mg | ORAL_TABLET | Freq: Four times a day (QID) | ORAL | Status: DC | PRN

## 2021-12-01 MED ORDER — PIPERACILLIN-TAZOBACTAM 3.375 G IVPB
3.3750 g | Freq: Three times a day (TID) | INTRAVENOUS | Status: DC
  Administered 2021-12-01 – 2021-12-04 (×10): 3.375 g via INTRAVENOUS
  Filled 2021-12-01 (×10): qty 50

## 2021-12-01 MED ORDER — ALBUTEROL SULFATE (2.5 MG/3ML) 0.083% IN NEBU
2.5000 mg | INHALATION_SOLUTION | Freq: Four times a day (QID) | RESPIRATORY_TRACT | Status: DC | PRN
  Administered 2021-12-02 – 2021-12-03 (×2): 2.5 mg via RESPIRATORY_TRACT
  Filled 2021-12-01 (×2): qty 3

## 2021-12-01 MED ORDER — IOHEXOL 300 MG/ML  SOLN
100.0000 mL | Freq: Once | INTRAMUSCULAR | Status: AC | PRN
  Administered 2021-12-01: 100 mL via INTRAVENOUS

## 2021-12-01 MED ORDER — PIPERACILLIN-TAZOBACTAM 3.375 G IVPB 30 MIN
3.3750 g | Freq: Once | INTRAVENOUS | Status: AC
Start: 2021-12-01 — End: 2021-12-01
  Administered 2021-12-01: 3.375 g via INTRAVENOUS
  Filled 2021-12-01: qty 50

## 2021-12-01 MED ORDER — THYROID 60 MG PO TABS
105.0000 mg | ORAL_TABLET | Freq: Every day | ORAL | Status: DC
  Filled 2021-12-01: qty 2

## 2021-12-01 MED ORDER — ALBUTEROL SULFATE HFA 108 (90 BASE) MCG/ACT IN AERS: 1.0000 | INHALATION_SPRAY | Freq: Four times a day (QID) | RESPIRATORY_TRACT | Status: DC | PRN

## 2021-12-01 NOTE — ED Provider Notes (Signed)
[facility] Emergency Department Provider Note MRN:  [id number]  Arrival date & time: 12/01/21     Chief Complaint   Near Syncope   History of Present Illness   [name] is a 66 y.o. year-old female with a history of small bowel obstruction presenting to the ED with chief complaint of near syncope.  Has been having abdominal pain with nausea for the past 2 or 3 days, this evening with vomiting, syncopal episode at home.  Felt really lightheaded prior to the syncopal event.  Hypotensive on arrival.  Review of Systems  A thorough review of systems was obtained and all systems are negative except as noted in the HPI and PMH.   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   History of hip replacement    Bilateral   Hypertension    Hypothyroidism    Menopause    Pancreatitis    PONV (postoperative nausea and vomiting)    Small bowel obstruction due to adhesions [facility]    Traumatic injury of back     Past Surgical History:  Procedure Laterality Date   ABDOMINAL ADHESION SURGERY  1972   ABDOMINAL HYSTERECTOMY     APPENDECTOMY  1995   AUGMENTATION MAMMAPLASTY     CERVICAL SPINE SURGERY     CESAREAN SECTION  2001   COLONOSCOPY     OOPHORECTOMY  1970's   left   SHOULDER SURGERY     THYROIDECTOMY  2003   TONSILLECTOMY     TOTAL HIP ARTHROPLASTY Bilateral    TOTAL KNEE ARTHROPLASTY Left 09/08/2020   Procedure: TOTAL KNEE ARTHROPLASTY;  Surgeon: Gaynelle Arabian, MD;  Location: [facility];  Service: Orthopedics;  Laterality: Left;   TUBAL LIGATION      Family History  Problem Relation Age of Onset   Lung cancer Father    Colitis Father    Alcohol abuse Father    Stroke Mother    Colon cancer Neg Hx    Breast cancer Neg Hx     Social History   Socioeconomic History   Marital status:  Legally Separated    Spouse name: Not on file   Number of children: 3   Years of education: Not on file   Highest education level: Not on file  Occupational History   Occupation: Armed forces technical officer rep    Employer: [employer]  Tobacco Use   Smoking status: Never   Smokeless tobacco: Never  Vaping Use   Vaping Use: Never used  Substance and Sexual Activity   Alcohol use: Yes    Alcohol/week: 0.0 standard drinks of alcohol    Comment: wine socially   Drug use: No   Sexual activity: Not Currently    Birth control/protection: None  Other Topics Concern   Not on file  Social History Narrative   Not on file   Social Determinants of Health   Financial Resource Strain: Not on file  Food Insecurity: Not on file  Transportation Needs: Not on file  Physical Activity: Not on file  Stress: Not on file  Social Connections: Not on file  Intimate Partner Violence: Not on file     Physical Exam   Vitals:   12/01/21 0238 12/01/21 0315  BP: (!) 90/54 91/66  Pulse: (!) 102 Marland Kitchen)  104  Resp: (!) 23 16  Temp:    SpO2: 97% 96%    CONSTITUTIONAL: Well-appearing, NAD NEURO/PSYCH:  Alert and oriented x 3, no focal deficits EYES:  eyes equal and reactive ENT/NECK:  no LAD, no JVD CARDIO: Regular rate, well-perfused, normal S1 and S2 PULM:  CTAB no wheezing or rhonchi GI/GU:  non-distended, non-tender MSK/SPINE:  No gross deformities, no edema SKIN:  no rash, atraumatic   *Additional and/or pertinent findings included in MDM below  Diagnostic and Interventional Summary    EKG Interpretation  Date/Time:  Tuesday December 01 2021 00:36:24 EDT Ventricular Rate:  99 PR Interval:  184 QRS Duration: 83 QT Interval:  341 QTC Calculation: 438 R Axis:   -38 Text Interpretation: Sinus rhythm Probable left atrial enlargement Left axis deviation Anterior infarct, old Confirmed by Gerlene Fee [phone]) on 12/01/2021 3:52:55 AM       Labs Reviewed  COMPREHENSIVE METABOLIC PANEL -  Abnormal; Notable for the following components:      Result Value   Glucose, Bld 118 (*)    Calcium 7.0 (*)    Total Protein 5.0 (*)    Albumin 3.0 (*)    AST 12 (*)    All other components within normal limits  CBC WITH DIFFERENTIAL/PLATELET - Abnormal; Notable for the following components:   WBC 15.6 (*)    Hemoglobin 11.8 (*)    Neutro Abs 11.4 (*)    Monocytes Absolute 1.2 (*)    Abs Immature Granulocytes 0.35 (*)    All other components within normal limits  CULTURE, BLOOD (ROUTINE X 2)  CULTURE, BLOOD (ROUTINE X 2)  URINE CULTURE  LACTIC ACID, PLASMA  LACTIC ACID, PLASMA  URINALYSIS, ROUTINE W REFLEX MICROSCOPIC  PROTIME-INR  APTT  TROPONIN I (HIGH SENSITIVITY)  TROPONIN I (HIGH SENSITIVITY)    CT ABDOMEN PELVIS W CONTRAST  Final Result    DG Chest Port 1 View  Final Result      Medications  piperacillin-tazobactam (ZOSYN) IVPB 3.375 g (3.375 g Intravenous New Bag/Given 12/01/21 0327)  lactated ringers bolus 1,000 mL (1,000 mLs Intravenous Bolus 12/01/21 0107)  lactated ringers bolus 1,000 mL (1,000 mLs Intravenous Bolus 12/01/21 0050)  sodium chloride (PF) 0.9 % injection (  Given by Other 12/01/21 0330)  lactated ringers bolus 1,000 mL (1,000 mLs Intravenous Bolus 12/01/21 0330)  iohexol (OMNIPAQUE) 300 MG/ML solution 100 mL (100 mLs Intravenous Contrast Given 12/01/21 0250)     Procedures  /  Critical Care Procedures  ED Course and Medical Decision Making  Initial Impression and Ddx Differential diagnosis includes small bowel obstruction, intra-abdominal infection, perforated viscus, gastroenteritis, syncope does seem to be due to hypotension in the setting of volume losses, possible vagal response.  Will obtain screening EKG as well, troponin to evaluate for cardiopulmonary process.  No fever, hypotensive, sepsis is considered, obtaining blood cultures, will monitor closely.  Past medical/surgical history that increases complexity of ED encounter:  None  Interpretation of Diagnostics I personally reviewed the EKG and my interpretation is as follows: Sinus tachycardia  Labs reveal leukocytosis, otherwise no significant blood count or electrolyte disturbance.  CT revealing diverticulitis, uncomplicated  Patient Reassessment and Ultimate Disposition/Management     We will admit to medicine for diverticulitis in the setting of hypotension, concern for sepsis though the hypertension may be more related to dehydration.  Patient management required discussion with the following services or consulting groups:  Hospitalist Service  Complexity of Problems Addressed Acute illness or injury that poses  threat of life of bodily function  Additional Data Reviewed and Analyzed Further history obtained from: None  Additional Factors Impacting ED Encounter Risk Use of parenteral controlled substances and Consideration of hospitalization  Barth Kirks. [place] Small, [place] [email or web address]  Final Clinical Impressions(s) / ED Diagnoses     ICD-10-CM   1. Diverticulitis  K57.92     2. Hypotension, unspecified hypotension type  I95.9       ED Discharge Orders     None        Discharge Instructions Discussed with and Provided to Patient:   Discharge Instructions   None      Maudie Flakes, MD 12/01/21 [id number]

## 2021-12-01 NOTE — Plan of Care (Signed)
°  Problem: Fluid Volume: °Goal: Hemodynamic stability will improve °Outcome: Progressing °  °Problem: Clinical Measurements: °Goal: Diagnostic test results will improve °Outcome: Progressing °Goal: Signs and symptoms of infection will decrease °Outcome: Progressing °  °

## 2021-12-01 NOTE — Consult Note (Signed)
Referring Provider: [facility] Primary Care Physician:  [facility], [facility] Primary Gastroenterologist:  Althia Forts, [place]  Reason for Consultation:  Diverticulitis  HPI: [name] is a 66 y.o. female with medical history significant of HTN, hypothyroidism, SBO.  Patient notes for the last 3 to 4 days she has had left lower abdominal pain.  Patient denies hematuria but then she began to have increasing nausea.  Starting yesterday she had 2 bouts of diarrhea that were watery, she had 1 episode of vomiting she also started to feel dizzy and lightheaded.  Patient went to donate plasma yesterday she also had noted temperature 99.5.  She had a syncopal episode at which point EMS was called.  Reports last colonoscopy was many years ago after which she had GI issues for 2 years.  Patient needs at this time she is not interested in repeating colonoscopy.  She has been doing regular Cologuard's with the [place]. Denies family history of colon cancer.  No previous EGD.  Past Medical History:  Diagnosis Date   Arthritis    Asthma    Cancer ([place])    skin   Cholecystitis    GERD (gastroesophageal reflux disease)    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   History of hip replacement    Bilateral   Hypertension    Hypothyroidism    Menopause    Pancreatitis    PONV (postoperative nausea and vomiting)    Small bowel obstruction due to adhesions [facility])    Traumatic injury of back     Past Surgical History:  Procedure Laterality Date   ABDOMINAL ADHESION SURGERY  1972   ABDOMINAL HYSTERECTOMY     APPENDECTOMY  1995   AUGMENTATION MAMMAPLASTY     CERVICAL SPINE SURGERY     CESAREAN SECTION  2001   COLONOSCOPY     OOPHORECTOMY  1970's   left   SHOULDER SURGERY     THYROIDECTOMY  2003   TONSILLECTOMY     TOTAL HIP ARTHROPLASTY Bilateral    TOTAL KNEE ARTHROPLASTY Left 09/08/2020   Procedure: TOTAL KNEE ARTHROPLASTY;  Surgeon: Gaynelle Arabian, MD;  Location: [facility];  Service: Orthopedics;  Laterality: Left;    TUBAL LIGATION      Prior to Admission medications   Medication Sig Start Date End Date Taking? Authorizing Provider  albuterol (VENTOLIN HFA) 108 (90 Base) MCG/ACT inhaler Inhale 1-2 puffs into the lungs every 6 (six) hours as needed for wheezing or shortness of breath.   Yes [provider]  BIOTIN PO Take 1 tablet by mouth daily.   Yes [provider]  BORON PO Take 1 tablet by mouth daily.   Yes [provider]  calcium citrate (CALCITRATE - DOSED IN MG ELEMENTAL CALCIUM) 950 (200 Ca) MG tablet Take 400 mg of elemental calcium by mouth daily.   Yes [provider]  Coenzyme Q10 (CO Q-10) 300 MG CAPS Take 300 mg by mouth daily.   Yes [provider]  esomeprazole (NEXIUM) 20 MG capsule Take 20 mg by mouth daily with breakfast.   Yes [provider]  estradiol (VIVELLE-DOT) 0.1 MG/24HR patch Place 1 patch (0.1 mg total) onto the skin 2 (two) times a week. Patient taking differently: Place 1 patch onto the skin 2 (two) times a week. Placed Monday and Thursday 07/16/21  Yes Guss Bunde, MD  folic acid (FOLVITE) 834 MCG tablet Take 400 mcg by mouth daily.   Yes [provider]  GLUTATHIONE PO Take 2 tablets by mouth  daily.   Yes [provider]  KRILL OIL PO Take 1 capsule by mouth daily.   Yes [provider]  lisinopril (ZESTRIL) 10 MG tablet Take 10 mg by mouth 3 (three) times daily.   Yes [provider]  MAGNESIUM CITRATE PO Take 1 tablet by mouth daily.   Yes [provider]  montelukast (SINGULAIR) 10 MG tablet Take 10 mg by mouth daily as needed (shortness of breath). 04/23/19  Yes [provider]  Niacin (VITAMIN B-3 PO) Take 1 tablet by mouth daily.   Yes [provider]  NIACINAMIDE PO Take 1 tablet by mouth daily.   Yes [provider]  rOPINIRole (REQUIP) 1 MG tablet Take 1-2 mg by mouth See admin instructions. 1 mg in the morning, 2 mg in the evening.    Yes [provider]  Thiamine HCl (VITAMIN B-1 PO) Take 1 tablet by mouth daily.   Yes [provider]  thyroid (NP THYROID) 15 MG tablet Take 15 mg by mouth daily.   Yes [provider]  thyroid (NP THYROID) 90 MG tablet Take 90 mg by mouth daily.   Yes [provider]  traMADol (ULTRAM) 50 MG tablet Take 50 mg by mouth every 12 (twelve) hours as needed for moderate pain. 09/04/21  Yes [provider]  VITAMIN A PO Take 1 tablet by mouth daily.   Yes [provider]  vitamin B-12 (CYANOCOBALAMIN) 1000 MCG tablet Take 1,000 mcg by mouth daily.   Yes [provider]  vitamin E 1000 UNIT capsule Take 1,000 Units by mouth daily.   Yes [provider]  zinc gluconate 50 MG tablet Take 50 mg by mouth daily.   Yes [provider]  L-Arginine 1000 MG TABS Take 1,000 mg by mouth daily. Patient not taking: Reported on 12/01/2021    [provider]  OVER THE COUNTER MEDICATION Take 1 tablet by mouth daily. Beet root Patient not taking: Reported on 12/01/2021    [provider]    Scheduled Meds:  calcium citrate  400 mg of elemental calcium Oral Daily   enoxaparin (LOVENOX) injection  40 mg Subcutaneous L24M   folic acid  0.5 mg Oral Daily   pantoprazole  40 mg Oral Daily   rOPINIRole  1 mg Oral Daily   rOPINIRole  2 mg Oral QHS   thyroid  105 mg Oral QAC breakfast   vitamin B-12  1,000 mcg Oral Daily   zinc sulfate  220 mg Oral Daily   Continuous Infusions:  lactated ringers 125 mL/hr at 12/01/21 0736   piperacillin-tazobactam (ZOSYN)  IV 3.375 g (12/01/21 1025)   PRN Meds:.acetaminophen **OR** acetaminophen, albuterol, montelukast, ondansetron **OR** ondansetron (ZOFRAN) IV, oxyCODONE-acetaminophen  Allergies as of 11/30/2021 - Review Complete 11/30/2021  Allergen Reaction Noted   Anesthetics, ester Nausea And Vomiting 04/20/2021   Lidocaine Nausea And Vomiting 07/20/2013   Other Cough  09/02/2020   Oxycodone-acetaminophen Rash 04/20/2021   Sulfa antibiotics Hives 03/02/2011   Sulfasalazine Hives and Rash 05/05/2011   Amlodipine Swelling 05/19/2018   Codeine Hives 02/24/2012   Hydrocodone Nausea And Vomiting 02/24/2012   Lisinopril Itching 04/20/2018   Oxycodone Nausea And Vomiting 02/15/2014   Prednisone  02/24/2012   Ropinirole Nausea And Vomiting and Nausea Only 11/09/2016    Family History  Problem Relation Age of Onset   Lung cancer Father    Colitis Father    Alcohol abuse Father    Stroke Mother  Colon cancer Neg Hx    Breast cancer Neg Hx     Social History   Socioeconomic History   Marital status: Legally Separated    Spouse name: Not on file   Number of children: 3   Years of education: Not on file   Highest education level: Not on file  Occupational History   Occupation: Armed forces technical officer rep    Employer: [employer]  Tobacco Use   Smoking status: Never   Smokeless tobacco: Never  Vaping Use   Vaping Use: Never used  Substance and Sexual Activity   Alcohol use: Yes    Alcohol/week: 0.0 standard drinks of alcohol    Comment: wine socially   Drug use: No   Sexual activity: Not Currently    Birth control/protection: None  Other Topics Concern   Not on file  Social History Narrative   Not on file   Social Determinants of Health   Financial Resource Strain: Not on file  Food Insecurity: Not on file  Transportation Needs: Not on file  Physical Activity: Not on file  Stress: Not on file  Social Connections: Not on file  Intimate Partner Violence: Not on file    Review of Systems: Review of Systems  Constitutional:  Positive for chills and malaise/fatigue. Negative for fever.  HENT:  Negative for hearing loss and tinnitus.   Eyes:  Negative for blurred vision and double vision.  Respiratory:  Negative for cough and hemoptysis.   Cardiovascular:  Negative for chest pain and palpitations.  Gastrointestinal:  Positive for  abdominal pain, diarrhea, nausea and vomiting. Negative for blood in stool, constipation, heartburn and melena.  Genitourinary:  Negative for dysuria and urgency.  Musculoskeletal:  Negative for myalgias and neck pain.  Skin:  Negative for itching and rash.  Neurological:  Negative for dizziness and headaches.  Endo/Heme/Allergies:  Negative for environmental allergies. Does not bruise/bleed easily.  Psychiatric/Behavioral:  Negative for depression and substance abuse.      Physical Exam:Physical Exam Constitutional:      General: She is not in acute distress.    Appearance: Normal appearance. She is normal weight.  HENT:     Head: Normocephalic and atraumatic.     Right Ear: External ear normal.     Left Ear: External ear normal.     Nose: Nose normal.     Mouth/Throat:     Mouth: Mucous membranes are moist.  Eyes:     Pupils: Pupils are equal, round, and reactive to light.  Cardiovascular:     Rate and Rhythm: Normal rate and regular rhythm.     Pulses: Normal pulses.     Heart sounds: Normal heart sounds.  Pulmonary:     Effort: Pulmonary effort is normal.     Breath sounds: Normal breath sounds.  Abdominal:     General: Abdomen is flat. Bowel sounds are normal. There is no distension.     Palpations: Abdomen is soft. There is no mass.     Tenderness: There is abdominal tenderness (LLQ). There is no guarding or rebound.     Hernia: No hernia is present.  Musculoskeletal:        General: No swelling. Normal range of motion.     Cervical back: Normal range of motion and neck supple.  Skin:    General: Skin is warm and dry.     Coloration: Skin is not pale.  Neurological:     General: No focal deficit present.  Mental Status: She is alert and oriented to person, place, and time. Mental status is at baseline.  Psychiatric:        Mood and Affect: Mood normal.        Behavior: Behavior normal.     Physical Activity: Not on file   =e Vital signs: Vitals:    12/01/21 1430 12/01/21 1458  BP: 105/67 136/88  Pulse: 93 95  Resp: 16 16  Temp:  98.5 F (36.9 C)  SpO2: 99% 96%   Last BM Date : 11/30/21    GI:  Lab Results: Recent Labs    12/01/21 0028  WBC 15.6*  HGB 11.8*  HCT 36.4  PLT 213   BMET Recent Labs    12/01/21 0028  NA 142  K 4.1  CL 111  CO2 25  GLUCOSE 118*  BUN 15  CREATININE 0.90  CALCIUM 7.0*   LFT Recent Labs    12/01/21 0028  PROT 5.0*  ALBUMIN 3.0*  AST 12*  ALT 10  ALKPHOS 41  BILITOT 0.5   PT/INR Recent Labs    12/01/21 0040  LABPROT 13.3  INR 1.0     Studies/Results: CT ABDOMEN PELVIS W CONTRAST  Result Date: 12/01/2021 CLINICAL DATA:  Lower abdominal pain and nausea. EXAM: CT ABDOMEN AND PELVIS WITH CONTRAST TECHNIQUE: Multidetector CT imaging of the abdomen and pelvis was performed using the standard protocol following bolus administration of intravenous contrast. RADIATION DOSE REDUCTION: This exam was performed according to the departmental dose-optimization program which includes automated exposure control, adjustment of the mA and/or kV according to patient size and/or use of iterative reconstruction technique. CONTRAST:  148m OMNIPAQUE IOHEXOL 300 MG/ML  SOLN COMPARISON:  November 28, 2007 FINDINGS: Lower chest: No acute abnormality. Hepatobiliary: A stable 1.2 cm focus of parenchymal low attenuation is seen within the posterior aspect of the right lobe of the liver. No gallstones, gallbladder wall thickening, or biliary dilatation. Pancreas: Unremarkable. No pancreatic ductal dilatation or surrounding inflammatory changes. Spleen: Normal in size without focal abnormality. Adrenals/Urinary Tract: Adrenal glands are unremarkable. Kidneys are normal, without renal calculi, focal lesion, or hydronephrosis. The urinary bladder is limited secondary to overlying streak artifact and is otherwise unremarkable. Stomach/Bowel: Stomach is within normal limits. The appendix is not clearly identified. No  evidence of bowel dilatation. Mildly inflamed diverticula are seen within the mid sigmoid colon. There is no evidence of associated perforation or abscess. This area is limited in evaluation secondary to the presence of overlying streak artifact. Noninflamed diverticula are seen throughout the remainder of the large bowel. Vascular/Lymphatic: Aortic atherosclerosis. No enlarged abdominal or pelvic lymph nodes. Reproductive: The uterus is not identified. Other: No abdominal wall hernia or abnormality. No abdominopelvic ascites. Musculoskeletal: Bilateral breast implants are seen. Bilateral total hip replacements are noted with associated streak artifact and subsequently limited evaluation of the adjacent osseous and soft tissue structures. There is mild to moderate severity levoscoliosis of the lower thoracic and upper lumbar spine with multilevel degenerative changes. IMPRESSION: 1. Mild sigmoid diverticulitis. 2. Stable hepatic cyst versus hemangioma. 3. Bilateral total hip replacements with associated streak artifact and subsequently limited evaluation of the adjacent osseous and soft tissue structures. 4. Aortic atherosclerosis. Aortic Atherosclerosis (ICD10-I70.0). Electronically Signed   By: TVirgina NorfolkM.D.   On: 12/01/2021 03:15   DG Chest Port 1 View  Result Date: 12/01/2021 CLINICAL DATA:  Syncope, vomiting EXAM: PORTABLE CHEST 1 VIEW COMPARISON:  None Available. FINDINGS: Lungs are clear.  No pleural effusion or  pneumothorax. The heart is normal in size. IMPRESSION: No evidence of acute cardiopulmonary disease. Electronically Signed   By: Julian Hy M.D.   On: 12/01/2021 00:53    Impression: Diverticulitis Sepsis  CT abdomen pelvis with contrast 12/01/2021 1. Mild sigmoid diverticulitis. 2. Stable hepatic cyst versus hemangioma. 3. Bilateral total hip replacements with associated streak artifact and subsequently limited evaluation of the adjacent osseous and soft tissue structures.  4. Aortic atherosclerosis.  WBC 15.6 HGB 11.6   Plan: Continue clear liquid diet Continue pantoprazole 40 mg daily Continue Zosyn 3.375g every 8 hours.  Continue pain control per primary team Recommend follow-up outpatient for colonoscopy after solution of diverticulitis.  At this time patient states she is not interested in another colonoscopy.  LOS: 0 days   Charlott Rakes  PA-C 12/01/2021, 3:26 PM  Contact #  [phone]

## 2021-12-01 NOTE — Progress Notes (Signed)
Patient seen and examined personally, I reviewed the chart, history and physical and admission note, done by admitting physician this morning and agree with the same with following addendum.  Please refer to the morning admission note for more detailed plan of care.  Briefly,  66 year old female with history of small bowel obstruction, hypertension, asthma, arthritis, GERD, hypothyroidism Who came to the ED for evaluation of syncope after giving plasma. She normally donates plasma but started feeling dizzy with emesis, she has been having nausea past week and pain in the lower abdomen. In the ED hypotensive in 80s initially improved with a liter bolus.  Was having mild sinus tachycardia, leukocytosis normal lactic acid, CT renal pelvis with mild sigmoid diverticulitis, hepatic cyst versus hemangioma, bilateral hip replacement with history cardiac.  Placed on IV antibiotics and was admitted.    Seen this am C/o nausea and LLQ abdomen pain and not much better from yesterday  On exam tender on LLQ. AAOX3, on RA  Sepsis poa 2.2 diverticulitis of colon Diverticulitis of colon: Continue Zosyn, pain control IV with hydration.  Diet n.p.o., start clears and ADHD.  Soft BP/history of Hypertension Sinus tachycardia: Bp stable, continue IV fluids.  RLS-resume Requip Hypothyroidism: Resume her thyroid supplement On multiple vitamins resume as tolerated  change patient inpatient due to ongoing need of IV antibiotics IV fluids due to sepsis diverticulitis

## 2021-12-01 NOTE — Progress Notes (Signed)
Responded to consult for IV. Pt currently on phone call and declining 2nd site. Daughter in room. Explained need for 2nd site due to medications ordered. IV team will return after pt completes phone call.

## 2021-12-01 NOTE — Assessment & Plan Note (Signed)
Mild uncomplicated diverticulitis on CT; however, appears to be causing sepsis with hypotension.  See sepsis above.

## 2021-12-01 NOTE — Assessment & Plan Note (Signed)
Sepsis with hypotension. Patient meets criteria for sepsis at time of admission. Specifically the patient has at least 2 out of 4 SIRS criteria, namely: Tachycardia, WBC The currently suspected source of infection is Diverticulitis Lactic acid: 1.0 Blood pressure: 82/48 initially, improved to ~100 SBP after IVF boluses IVF: 3L bolus, now 75 cc/hr (pt only weighs 65 kg). Antibiotics: Zosyn 1. BCx pending 2. Continue zosyn 3. Sepsis pathway 4. Hold home BP meds

## 2021-12-01 NOTE — Hospital Course (Addendum)
66 year old female with history of small bowel obstruction, hypertension, asthma, arthritis, GERD, hypothyroidism, history of total hip and knee arthroplasty came to the ED for evaluation of syncope after giving plasma. She normally donates plasma but started feeling dizzy with emesis, she has been having nausea past week and pain in the lower abdomen. In the ED hypotensive in 80s initially improved with a liter bolus.  Was having mild sinus tachycardia, leukocytosis normal lactic acid, CT renal pelvis with mild sigmoid diverticulitis, hepatic cyst versus hemangioma, bilateral hip replacement with history cardiac.  Placed on IV antibiotics and was admitted.  Diet was slowly advanced seen by GI, patient now tolerating diet, on soft diet pain is stable improved, no fever.  We will do oral trial of Augmentin before discharge home to make sure she is able to tolerate p.o. antibiotics as well.  Patient instructed with daughters presence  today that she needs colonoscopy in 4 weeks to rule out malignancy

## 2021-12-01 NOTE — Assessment & Plan Note (Signed)
Hypotensive today. Hold lisinopril.

## 2021-12-01 NOTE — Progress Notes (Signed)
PHARMACIST - PHYSICIAN ORDER COMMUNICATION  CONCERNING: P&T Medication Policy on Herbal Medications  DESCRIPTION:  This patient's order for:  Cod Liver oil and CoQ10  has been noted.  This product(s) is classified as an "herbal" or natural product. Due to a lack of definitive safety studies or FDA approval, nonstandard manufacturing practices, plus the potential risk of unknown drug-drug interactions while on inpatient medications, the Pharmacy and Therapeutics Committee does not permit the use of "herbal" or natural products of this type within [facility].   ACTION TAKEN: The pharmacy department is unable to verify this order at this time and your patient has been informed of this safety policy. Please reevaluate patient's clinical condition at discharge and address if the herbal or natural product(s) should be resumed at that time.   Cierrah Dace S. Alford Highland, PharmD, BCPS Clinical Staff Pharmacist [email or web address]

## 2021-12-01 NOTE — Progress Notes (Signed)
Pharmacy Antibiotic Note  [name] is a 66 y.o. female admitted on 11/30/2021 with  intra-abdominal infection .  Pharmacy has been consulted for Zosyn dosing.  Plan: Zosyn 3.375g IV q8h (4 hour infusion). No dose adjustments anticipated.  Pharmacy will sign off and monitor peripherally via electronic surveillance software for any changes in renal function or micro data.   Height: '5\' 3"'$  (160 cm) Weight: 65.8 kg (145 lb) IBW/kg (Calculated) : 52.4  Temp (24hrs), Avg:97.9 F (36.6 C), Min:97.7 F (36.5 C), Max:98 F (36.7 C)  Recent Labs  Lab 12/01/21 0028 12/01/21 0046 12/01/21 0322  WBC 15.6*  --   --   CREATININE 0.90  --   --   LATICACIDVEN  --  1.0 0.8    Estimated Creatinine Clearance: 58.4 mL/min (by C-G formula based on SCr of 0.9 mg/dL).    Allergies  Allergen Reactions   Anesthetics, Ester Nausea And Vomiting   Lidocaine Nausea And Vomiting   Other Cough    Refuse blood or blood products   Oxycodone-Acetaminophen Rash   Sulfa Antibiotics Hives   Sulfasalazine Hives and Rash   Amlodipine Swelling   Codeine Hives   Hydrocodone Nausea And Vomiting   Lisinopril Itching    Can take split doses-takes 10 in am and 10 in evening   Oxycodone Nausea And Vomiting   Prednisone     Causes her to have vaginal bleeding   Ropinirole Nausea And Vomiting and Nausea Only    Thank you for allowing pharmacy to be a part of this patient's care.  Netta Cedars PharmD 12/01/2021 5:46 AM

## 2021-12-01 NOTE — H&P (Signed)
History and Physical    Patient: [name] XAJ:[id number] DOB: [birth year] DOA: 11/30/2021 DOS: the patient was seen and examined on 12/01/2021 PCP: [facility]  Patient coming from: Home  Chief Complaint:  Chief Complaint  Patient presents with   Near Syncope   HPI: CASHE GATT is a 66 y.o. female with medical history significant of HTN, hypothyroidism, SBO.  Pt in to ED with c/o 2-3 day h/o LLQ abd pain, nausea, vomiting, finally syncopal episode at home today.  Lightheaded prior to syncope.  In to ED.  In ED hypotensive with SBP in 80s initially, improved after 3L bolus.   Review of Systems: As mentioned in the history of present illness. All other systems reviewed and are negative. Past Medical History:  Diagnosis Date   Arthritis    Asthma    Cancer ([place])    skin   Cholecystitis    GERD (gastroesophageal reflux disease)    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   History of hip replacement    Bilateral   Hypertension    Hypothyroidism    Menopause    Pancreatitis    PONV (postoperative nausea and vomiting)    Small bowel obstruction due to adhesions [facility])    Traumatic injury of back    Past Surgical History:  Procedure Laterality Date   ABDOMINAL ADHESION SURGERY  1972   ABDOMINAL HYSTERECTOMY     APPENDECTOMY  1995   AUGMENTATION MAMMAPLASTY     CERVICAL SPINE SURGERY     CESAREAN SECTION  2001   COLONOSCOPY     OOPHORECTOMY  1970's   left   SHOULDER SURGERY     THYROIDECTOMY  2003   TONSILLECTOMY     TOTAL HIP ARTHROPLASTY Bilateral    TOTAL KNEE ARTHROPLASTY Left 09/08/2020   Procedure: TOTAL KNEE ARTHROPLASTY;  Surgeon: Gaynelle Arabian, MD;  Location: [facility];  Service: Orthopedics;  Laterality: Left;   TUBAL LIGATION     Social History:  reports that she has never smoked. She has never used smokeless tobacco. She reports current alcohol use. She reports that she does not use drugs.  Allergies  Allergen Reactions   Anesthetics, Ester Nausea And  Vomiting   Lidocaine Nausea And Vomiting   Other Cough    Refuse blood or blood products   Oxycodone-Acetaminophen Rash   Sulfa Antibiotics Hives   Sulfasalazine Hives and Rash   Amlodipine Swelling   Codeine Hives   Hydrocodone Nausea And Vomiting   Lisinopril Itching    Can take split doses-takes 10 in am and 10 in evening   Oxycodone Nausea And Vomiting   Prednisone     Causes her to have vaginal bleeding   Ropinirole Nausea And Vomiting and Nausea Only    Family History  Problem Relation Age of Onset   Lung cancer Father    Colitis Father    Alcohol abuse Father    Stroke Mother    Colon cancer Neg Hx    Breast cancer Neg Hx     Prior to Admission medications   Medication Sig Start Date End Date Taking? Authorizing Provider  albuterol (VENTOLIN HFA) 108 (90 Base) MCG/ACT inhaler Inhale 1-2 puffs into the lungs every 6 (six) hours as needed for wheezing or shortness of breath.   Yes [provider]  BIOTIN PO Take 1 tablet by mouth daily.   Yes [provider]  calcium citrate (CALCITRATE - DOSED IN MG ELEMENTAL CALCIUM) 950 (200 Ca) MG tablet Take 400  mg of elemental calcium by mouth daily.   Yes [provider]  Cod Liver Oil 1000 MG CAPS Take 1,000 mg by mouth daily.   Yes [provider]  Coenzyme Q10 (CO Q-10) 300 MG CAPS Take 300 mg by mouth daily.   Yes [provider]  esomeprazole (NEXIUM) 20 MG capsule Take 20 mg by mouth daily with breakfast.   Yes [provider]  estradiol (VIVELLE-DOT) 0.1 MG/24HR patch Place 1 patch (0.1 mg total) onto the skin 2 (two) times a week. Patient taking differently: Place 1 patch onto the skin 2 (two) times a week. Placed Monday and Thursday 07/16/21  Yes Guss Bunde, MD  folic acid (FOLVITE) 532 MCG tablet Take 400 mcg by mouth daily.   Yes [provider]  GLUTATHIONE PO Take 2 tablets by mouth daily.   Yes [provider]  L-Arginine 1000 MG TABS Take  1,000 mg by mouth daily.   Yes [provider]  lisinopril (ZESTRIL) 10 MG tablet Take 10 mg by mouth 2 (two) times daily.   Yes [provider]  MAGNESIUM CITRATE PO Take 1 tablet by mouth daily.   Yes [provider]  montelukast (SINGULAIR) 10 MG tablet Take 10 mg by mouth daily as needed (shortness of breath). 04/23/19  Yes [provider]  NIACINAMIDE PO Take 1 tablet by mouth daily.   Yes [provider]  OVER THE COUNTER MEDICATION Take 1 tablet by mouth daily. Beet root   Yes [provider]  thyroid (ARMOUR) 90 MG tablet Take 90 mg by mouth daily before breakfast.   Yes [provider]  traMADol (ULTRAM) 50 MG tablet Take 50 mg by mouth every 12 (twelve) hours as needed for moderate pain. 09/04/21  Yes [provider]  vitamin B-12 (CYANOCOBALAMIN) 1000 MCG tablet Take 1,000 mcg by mouth daily.   Yes [provider]  zinc gluconate 50 MG tablet Take 50 mg by mouth daily.   Yes [provider]  vitamin E 1000 UNIT capsule Take 1,000 Units by mouth daily. Patient not taking: Reported on 12/01/2021    [provider]    Physical Exam: Vitals:   12/01/21 0415 12/01/21 0450 12/01/21 0500 12/01/21 0515  BP:  1'17/78 96/71 95/61 '$  Pulse:  (!) 103 (!) 105 (!) 102  Resp:  '16 15 18  '$ Temp: 98 F (36.7 C)     TempSrc: Oral     SpO2:  98% 95% 95%  Weight:      Height:       Constitutional: NAD, calm, comfortable Eyes: PERRL, lids and conjunctivae normal ENMT: Mucous membranes are moist. Posterior pharynx clear of any exudate or lesions.Normal dentition.  Neck: normal, supple, no masses, no thyromegaly Respiratory: clear to auscultation bilaterally, no wheezing, no crackles. Normal respiratory effort. No accessory muscle use.  Cardiovascular: Regular rate and rhythm, no murmurs / rubs / gallops. No extremity edema. 2+ pedal pulses. No carotid bruits.  Abdomen: LLQ TTP Musculoskeletal: no  clubbing / cyanosis. No joint deformity upper and lower extremities. Good ROM, no contractures. Normal muscle tone.  Skin: no rashes, lesions, ulcers. No induration Neurologic: CN 2-12 grossly intact. Sensation intact, DTR normal. Strength 5/5 in all 4.  Psychiatric: Normal judgment and insight. Alert and oriented x 3. Normal mood.   Data Reviewed:    CBC    Component Value Date/Time   WBC 15.6 (H) 12/01/2021 0028   RBC 3.87 12/01/2021 0028   HGB  11.8 (L) 12/01/2021 0028   HCT 36.4 12/01/2021 0028   PLT 213 12/01/2021 0028   MCV 94.1 12/01/2021 0028   MCH 30.5 12/01/2021 0028   MCHC 32.4 12/01/2021 0028   RDW 13.2 12/01/2021 0028   LYMPHSABS 2.5 12/01/2021 0028   MONOABS 1.2 (H) 12/01/2021 0028   EOSABS 0.2 12/01/2021 0028   BASOSABS 0.0 12/01/2021 0028   CMP     Component Value Date/Time   NA 142 12/01/2021 0028   K 4.1 12/01/2021 0028   CL 111 12/01/2021 0028   CO2 25 12/01/2021 0028   GLUCOSE 118 (H) 12/01/2021 0028   BUN 15 12/01/2021 0028   CREATININE 0.90 12/01/2021 0028   CALCIUM 7.0 (L) 12/01/2021 0028   PROT 5.0 (L) 12/01/2021 0028   ALBUMIN 3.0 (L) 12/01/2021 0028   AST 12 (L) 12/01/2021 0028   ALT 10 12/01/2021 0028   ALKPHOS 41 12/01/2021 0028   BILITOT 0.5 12/01/2021 0028   GFRNONAA >60 12/01/2021 0028   Lactate 1.0, 0.8 on repeat Trop 4  CT AP IMPRESSION: 1. Mild sigmoid diverticulitis. 2. Stable hepatic cyst versus hemangioma. 3. Bilateral total hip replacements with associated streak artifact and subsequently limited evaluation of the adjacent osseous and soft tissue structures. 4. Aortic atherosclerosis.  CXR neg  Assessment and Plan: * Sepsis ([place]) Sepsis with hypotension. Patient meets criteria for sepsis at time of admission. Specifically the patient has at least 2 out of 4 SIRS criteria, namely: Tachycardia, WBC The currently suspected source of infection is Diverticulitis Lactic acid: 1.0 Blood pressure: 82/48 initially, improved  to ~100 SBP after IVF boluses IVF: 3L bolus, now 75 cc/hr (pt only weighs 65 kg). Antibiotics: Zosyn BCx pending Continue zosyn Sepsis pathway Hold home BP meds   Diverticulitis of colon Mild uncomplicated diverticulitis on CT; however, appears to be causing sepsis with hypotension.  See sepsis above.  Hypertension Hypotensive today. Hold lisinopril.      Advance Care Planning:   Code Status: Full Code  Consults: None  Family Communication: Family at bedside  Severity of Illness: The appropriate patient status for this patient is OBSERVATION. Observation status is judged to be reasonable and necessary in order to provide the required intensity of service to ensure the patient's safety. The patient's presenting symptoms, physical exam findings, and initial radiographic and laboratory data in the context of their medical condition is felt to place them at decreased risk for further clinical deterioration. Furthermore, it is anticipated that the patient will be medically stable for discharge from the hospital within 2 midnights of admission.   Author: Etta Quill., DO 12/01/2021 5:23 AM  For on call review [email or web address].

## 2021-12-01 NOTE — Sepsis Progress Note (Signed)
Following per sepsis protocol   

## 2021-12-02 DIAGNOSIS — A419 Sepsis, unspecified organism: Secondary | ICD-10-CM | POA: Diagnosis not present

## 2021-12-02 LAB — BLOOD CULTURE ID PANEL (REFLEXED) - BCID2

## 2021-12-02 LAB — COMPREHENSIVE METABOLIC PANEL
ALT: 12 U/L (ref 0–44)
AST: 16 U/L (ref 15–41)
Albumin: 3.3 g/dL — ABNORMAL LOW (ref 3.5–5.0)
Alkaline Phosphatase: 38 U/L (ref 38–126)
Anion gap: 8 (ref 5–15)
BUN: 7 mg/dL — ABNORMAL LOW (ref 8–23)
CO2: 28 mmol/L (ref 22–32)
Calcium: 7.9 mg/dL — ABNORMAL LOW (ref 8.9–10.3)
Chloride: 108 mmol/L (ref 98–111)
Creatinine, Ser: 0.82 mg/dL (ref 0.44–1.00)
GFR, Estimated: >60 mL/min (ref >60)
Glucose, Bld: 148 mg/dL — ABNORMAL HIGH (ref 70–99)
Potassium: 3.9 mmol/L (ref 3.5–5.1)
Sodium: 144 mmol/L (ref 135–145)
Total Bilirubin: 0.5 mg/dL (ref 0.3–1.2)
Total Protein: 5.7 g/dL — ABNORMAL LOW (ref 6.5–8.1)

## 2021-12-02 LAB — CBC
HCT: 34.4 % — ABNORMAL LOW (ref 36.0–46.0)
Hemoglobin: 11 g/dL — ABNORMAL LOW (ref 12.0–15.0)
MCH: 30.4 pg (ref 26.0–34.0)
MCHC: 32 g/dL (ref 30.0–36.0)
MCV: 95 fL (ref 80.0–100.0)
Platelets: 196 10*3/uL (ref 150–400)
RBC: 3.62 MIL/uL — ABNORMAL LOW (ref 3.87–5.11)
RDW: 13.4 % (ref 11.5–15.5)
WBC: 4.7 10*3/uL (ref 4.0–10.5)
nRBC: 0 % (ref 0.0–0.2)

## 2021-12-02 LAB — PROTIME-INR
INR: 1 (ref 0.8–1.2)
Prothrombin Time: 13.5 seconds (ref 11.4–15.2)

## 2021-12-02 LAB — PROCALCITONIN: Procalcitonin: <0.1 ng/mL

## 2021-12-02 LAB — GLUCOSE, CAPILLARY: Glucose-Capillary: 106 mg/dL — ABNORMAL HIGH (ref 70–99)

## 2021-12-02 LAB — HEMOGLOBIN AND HEMATOCRIT, BLOOD
HCT: 37.5 % (ref 36.0–46.0)
Hemoglobin: 12 g/dL (ref 12.0–15.0)

## 2021-12-02 LAB — CORTISOL-AM, BLOOD: Cortisol - AM: 11 ug/dL (ref 6.7–22.6)

## 2021-12-02 MED ORDER — PROSOURCE PLUS PO LIQD
30.0000 mL | Freq: Every day | ORAL | Status: DC
Start: 2021-12-02 — End: 2021-12-04
  Administered 2021-12-02 – 2021-12-03 (×2): 30 mL via ORAL
  Filled 2021-12-02 (×2): qty 30

## 2021-12-02 MED ORDER — SACCHAROMYCES BOULARDII 250 MG PO CAPS
250.0000 mg | ORAL_CAPSULE | Freq: Two times a day (BID) | ORAL | Status: DC
  Administered 2021-12-02 – 2021-12-04 (×5): 250 mg via ORAL
  Filled 2021-12-02 (×5): qty 1

## 2021-12-02 MED ORDER — ADULT MULTIVITAMIN W/MINERALS CH
1.0000 | ORAL_TABLET | Freq: Every day | ORAL | Status: DC
Start: 2021-12-02 — End: 2021-12-04
  Administered 2021-12-02 – 2021-12-04 (×3): 1 via ORAL
  Filled 2021-12-02 (×3): qty 1

## 2021-12-02 MED ORDER — DIPHENHYDRAMINE HCL 25 MG PO CAPS
25.0000 mg | ORAL_CAPSULE | Freq: Once | ORAL | Status: AC
  Administered 2021-12-02: 25 mg via ORAL
  Filled 2021-12-02: qty 1

## 2021-12-02 NOTE — Progress Notes (Signed)
NUTRITION NOTE  Secure chat message received from RN who shares that patient has questions about diet for after d/c.  Patient sitting up in the chair. Diet has been advanced from [facility] to [place]. Patient reports having prune juice, pudding, and yogurt since that time and that she had diarrhea after.   Patient limits white breads, pastas, and other non-whole grain items at home.   Talked with patient about foods that are better options and foods to avoid until after outpatient follow-up appointment.   Provided her with "Low Fiber Nutrition Therapy" and "Fiber Content of Foods" handouts from the Academy of [employer] and Dietetics and reviewed these handouts with her.  All questions answered.     Jarome Matin, MS, RD, LDN, [place] Registered Dietitian II Inpatient Clinical Nutrition RD pager # and on-call/weekend pager # available in [facility]

## 2021-12-02 NOTE — Progress Notes (Signed)
  Transition of Care [facility]) Screening Note   Patient Details  Name: [name] Date of Birth: 24-Jul-1955   Transition of Care [facility]) CM/SW Contact:    Dessa Phi, RN Phone Number: 12/02/2021, 3:49 PM    Transition of Care Department [facility]) has reviewed patient and no TOC needs have been identified at this time. We will continue to monitor patient advancement through interdisciplinary progression rounds. If new patient transition needs arise, please place a TOC consult.

## 2021-12-02 NOTE — Progress Notes (Signed)
Patient tolerated clear liquid dinner last night and breakfast today.  Requested advance in diet to full liquids.  See new orders.  Angie Fava, RN

## 2021-12-02 NOTE — Progress Notes (Signed)
Patient had a solid stool (medium-sized, little pellets) with small amounts of frank blood. Reported having to strain to to pass stool, and there was a little blood on toilet paper when she wiped. Denies history of hemorrhoids, none seen with assessment.  MD made aware.  Angie Fava, RN

## 2021-12-02 NOTE — Progress Notes (Signed)
[facility] Gastroenterology Progress Note  [name] 66 y.o. November 12, 1955  Subjective: Patient seen and examined lying in bed.  Abdominal pain is similar to yesterday.  Tolerating clear liquid diet well.  Notes she had some heart bowel movements yesterday she also noticed small blood with wiping.  ROS : Review of Systems  Gastrointestinal:  Positive for abdominal pain, blood in stool and constipation. Negative for diarrhea, heartburn, melena, nausea and vomiting.  Genitourinary:  Negative for dysuria and urgency.      Objective: Vital signs in last 24 hours: Vitals:   12/02/21 0238 12/02/21 1028  BP: 112/67   Pulse: 85   Resp: 16   Temp: 98.4 F (36.9 C)   SpO2: 98% 96%    Physical Exam:  General:  Alert, cooperative, no distress, appears stated age  Head:  Normocephalic, without obvious abnormality, atraumatic  Eyes:  Anicteric sclera, EOM's intact  Lungs:   Clear to auscultation bilaterally, respirations unlabored  Heart:  Regular rate and rhythm, S1, S2 normal  Abdomen:   Soft, left lower quadrant tenderness with guarding, bowel sounds active all four quadrants,  no masses,   Extremities: Extremities normal, atraumatic, no  edema  Pulses: 2+ and symmetric    Lab Results: Recent Labs    12/01/21 0028 12/02/21 0943  NA 142 144  K 4.1 3.9  CL 111 108  CO2 25 28  GLUCOSE 118* 148*  BUN 15 7*  CREATININE 0.90 0.82  CALCIUM 7.0* 7.9*   Recent Labs    12/01/21 0028 12/02/21 0943  AST 12* 16  ALT 10 12  ALKPHOS 41 38  BILITOT 0.5 0.5  PROT 5.0* 5.7*  ALBUMIN 3.0* 3.3*   Recent Labs    12/01/21 0028 12/02/21 0943  WBC 15.6* 4.7  NEUTROABS 11.4*  --   HGB 11.8* 11.0*  HCT 36.4 34.4*  MCV 94.1 95.0  PLT 213 196   Recent Labs    12/01/21 0040 12/02/21 0943  LABPROT 13.3 13.5  INR 1.0 1.0      Assessment Diverticulitis Sepsis   CT abdomen pelvis with contrast 12/01/2021 1. Mild sigmoid diverticulitis. 2. Stable hepatic cyst versus  hemangioma. 3. Bilateral total hip replacements with associated streak artifact and subsequently limited evaluation of the adjacent osseous and soft tissue structures. 4. Aortic atherosclerosis.   WBC 4.7 (15.7) HGB 11.0 (11.8)   Likely small amount of bright red blood due to hemorrhoids or straining with bowel movement, cannot exclude colon cancer or polyp.  Continue to highly recommend a colonoscopy after resolution of diverticulitis symptoms.  Hemoglobin is stable bleeding with small amount.  No plan for procedure inpatient due to active diverticulitis.   Plan: Continue clear liquid diet, advance as tolerated Continue pantoprazole 40 mg daily Continue Zosyn 3.375g every 8 hours.  Continue pain control per primary team Highly recommend follow-up outpatient for colonoscopy after solution of diverticulitis as she is having rectal bleeding and colon cancer can present similar to diverticulitis at times.   [facility] will sign off  Charlott Rakes PA-C 12/02/2021, 11:39 AM  Contact #  [phone]

## 2021-12-02 NOTE — Progress Notes (Signed)
PROGRESS NOTE [name]  VFI:[id number] DOB: [birth year] DOA: 11/30/2021 PCP: [facility]   Brief Narrative/Hospital Course: 66 year old female with history of small bowel obstruction, hypertension, asthma, arthritis, GERD, hypothyroidism Who came to the ED for evaluation of syncope after giving plasma. She normally donates plasma but started feeling dizzy with emesis, she has been having nausea past week and pain in the lower abdomen. In the ED hypotensive in 80s initially improved with a liter bolus.  Was having mild sinus tachycardia, leukocytosis normal lactic acid, CT renal pelvis with mild sigmoid diverticulitis, hepatic cyst versus hemangioma, bilateral hip replacement with history cardiac.  Placed on IV antibiotics and was admitted.      Subjective: Seen this morning Ambulating in the room, Reports she had solid BM last night, later nursing reported she had solid BM with small amount of fresh red blood  Assessment and Plan: Principal Problem:   Sepsis ([place]) Active Problems:   Diverticulitis of colon   Hypertension   Sigmoid diverticulitis   Sepsis poa 2/2 diverticulitis of colon Diverticulitis of sigmoid colon without complicating features Reports pain is improving but still tender left lower quadrant.  Continue Zosyn, continue pain control IV with hydration.  On clear liquid diet, advance as tolerated, continue plan of care as per GI will need outpatient colonoscopy to rule out malignancy but she is hesitant  Rectal bleeding : She had solid BM with small frank blood this morning.  Check H&H, notifying GI.  Hold Lovenox.   Recent Labs  Lab 12/01/21 0028 12/02/21 0943  HGB 11.8* 11.0*  HCT 36.4 34.4*   Staph epi in blood culture BCID-no resistance , in anaerobic bottle 1/4: Likely contamination, already on Zosyn as #1.  Follow-up culture  Soft BP/history of Hypertension Sinus tachycardia: Tachycardia resolved, BP stable.  Continue gentle IV fluids     RLS-continue Requip Hypothyroidism: Continue her thyroid supplement  DVT prophylaxis: enoxaparin (LOVENOX) injection 40 mg Start: 12/01/21 0800-refusing, add SCD, DC Lovenox due to rectal bleeding. Code Status:   Code Status: Full Code Family Communication: plan of care discussed with patient at bedside. Patient status is: Inpatient because of ongoing management of diverticulitis Level of care: Telemetry   Dispo: The patient is from: home            Anticipated disposition: home in 1-2 days  Mobility Assessment (last 72 hours)     Mobility Assessment     Row Name 12/01/21 2000 12/01/21 1533         Does patient have an order for bedrest or is patient medically unstable -- No - Continue assessment      What is the highest level of mobility based on the progressive mobility assessment? Level 6 (Walks independently in room and hall) - Balance while walking in room without assist - Complete Level 6 (Walks independently in room and hall) - Balance while walking in room without assist - Complete                Objective: Vitals last 24 hrs: Vitals:   12/01/21 2134 12/01/21 2200 12/02/21 0118 12/02/21 0238  BP:   105/70 112/67  Pulse:   80 85  Resp: '16 18  16  '$ Temp:   98.5 F (36.9 C) 98.4 F (36.9 C)  TempSrc:   Oral Oral  SpO2:   100% 98%  Weight:      Height:       Weight change:   Physical Examination: General exam: alert awake,older than  stated age, weak appearing. HEENT:Oral mucosa moist, Ear/Nose WNL grossly, dentition normal. Respiratory system: bilaterally clear BS, no use of accessory muscle Cardiovascular system: S1 & S2 +, No JVD. Gastrointestinal system: Abdomen soft, tender LLQ, ND, BS+ Nervous System:Alert, awake, moving extremities and grossly nonfocal Extremities: LE edema neg,distal peripheral pulses palpable.  Skin: No rashes,no icterus. MSK: Normal muscle bulk,tone, power  Medications reviewed:  Scheduled Meds:  calcium citrate  400 mg of  elemental calcium Oral Daily   enoxaparin (LOVENOX) injection  40 mg Subcutaneous Q24H   feeding supplement  1 Container Oral TID BM   fluticasone  1 spray Each Nare Daily   folic acid  0.5 mg Oral Daily   pantoprazole  40 mg Oral Daily   rOPINIRole  1 mg Oral Daily   rOPINIRole  2 mg Oral QHS   thyroid  105 mg Oral QAC breakfast   vitamin B-12  1,000 mcg Oral Daily   zinc sulfate  220 mg Oral Daily   Continuous Infusions:  lactated ringers 125 mL/hr at 12/02/21 0423   piperacillin-tazobactam (ZOSYN)  IV 3.375 g (12/02/21 0139)      Diet Order             Diet clear liquid Room service appropriate? Yes; Fluid consistency: Thin  Diet effective now                            Intake/Output Summary (Last 24 hours) at 12/02/2021 1009 Last data filed at 12/02/2021 1001 Gross per 24 hour  Intake 325 ml  Output 5100 ml  Net -4775 ml   Net IO Since Admission: -4,733.73 mL [12/02/21 1009]  Wt Readings from Last 3 Encounters:  11/30/21 65.8 kg  09/07/21 67.6 kg  04/20/21 68.9 kg     Unresulted Labs (From admission, onward)     Start     Ordered   12/02/21 0500  Protime-INR  Tomorrow morning,   R        12/01/21 0522   12/02/21 0500  Cortisol-am, blood  Tomorrow morning,   R        12/01/21 0522   12/02/21 0500  Procalcitonin  Tomorrow morning,   R        12/01/21 0522   12/02/21 0500  CBC  Tomorrow morning,   R        12/01/21 0522   12/02/21 0500  Comprehensive metabolic panel  Tomorrow morning,   R        12/01/21 0522   12/02/21 6160  Basic metabolic panel  Daily at 5am,   R      12/01/21 0720   12/02/21 0500  CBC  Daily at 5am,   R      12/01/21 0720          Data Reviewed: I have personally reviewed following labs and imaging studies CBC: Recent Labs  Lab 12/01/21 0028  WBC 15.6*  NEUTROABS 11.4*  HGB 11.8*  HCT 36.4  MCV 94.1  PLT 737   Basic Metabolic Panel: Recent Labs  Lab 12/01/21 0028  NA 142  K 4.1  CL 111  CO2 25  GLUCOSE 118*   BUN 15  CREATININE 0.90  CALCIUM 7.0*   GFR: Estimated Creatinine Clearance: 58.4 mL/min (by C-G formula based on SCr of 0.9 mg/dL). Liver Function Tests: Recent Labs  Lab 12/01/21 0028  AST 12*  ALT 10  ALKPHOS 41  BILITOT 0.5  PROT 5.0*  ALBUMIN 3.0*   No results for input(s): "LIPASE", "AMYLASE" in the last 168 hours. No results for input(s): "AMMONIA" in the last 168 hours. Coagulation Profile: Recent Labs  Lab 12/01/21 0040  INR 1.0   BNP (last 3 results) No results for input(s): "PROBNP" in the last 8760 hours. HbA1C: No results for input(s): "HGBA1C" in the last 72 hours. CBG: No results for input(s): "GLUCAP" in the last 168 hours. Lipid Profile: No results for input(s): "CHOL", "HDL", "LDLCALC", "TRIG", "CHOLHDL", "LDLDIRECT" in the last 72 hours. Thyroid Function Tests: No results for input(s): "TSH", "T4TOTAL", "FREET4", "T3FREE", "THYROIDAB" in the last 72 hours. Sepsis Labs: Recent Labs  Lab 12/01/21 0046 12/01/21 0322  LATICACIDVEN 1.0 0.8    Recent Results (from the past 240 hour(s))  Urine Culture     Status: None (Preliminary result)   Collection Time: 12/01/21 12:24 AM   Specimen: In/Out Cath Urine  Result Value Ref Range Status   Specimen Description   Final    IN/OUT CATH URINE Performed at [facility], [place] [address] [place], [place] [address]    Special Requests   Final    NONE Performed at [facility], [place] [address] [place], [place] [address]    Culture   Final    CULTURE REINCUBATED FOR BETTER GROWTH Performed at [facility], [place] [address] [place], [place] [address]    Report Status PENDING  Incomplete  Blood Culture (routine x 2)     Status: None (Preliminary result)   Collection Time: 12/01/21 12:46 AM   Specimen: BLOOD  Result Value Ref Range Status   Specimen Description   Final    BLOOD LEFT ANTECUBITAL Performed at [facility] [address], [place], [place] [address]    Special Requests   Final    BOTTLES DRAWN AEROBIC AND ANAEROBIC Blood Culture results may not be optimal due to an excessive volume of blood received in culture bottles Performed at [place] [address], [place], [place] [address]    Culture  Setup Time   Final    GRAM POSITIVE COCCI IN CLUSTERS ANAEROBIC BOTTLE ONLY Organism ID to follow CRITICAL RESULT CALLED TO, READ BACK BY AND VERIFIED WITH: E JACKSON,PHARMD'@0218'$  12/02/21 [place]    Culture   Final    NO GROWTH 1 DAY Performed at [facility], [place] [address] [place], [place] [address]    Report Status PENDING  Incomplete  Blood Culture ID Panel (Reflexed)     Status: Abnormal   Collection Time: 12/01/21 12:46 AM  Result Value Ref Range Status   Enterococcus faecalis NOT DETECTED NOT DETECTED Final   Enterococcus Faecium NOT DETECTED NOT DETECTED Final   Listeria monocytogenes NOT DETECTED NOT DETECTED Final   Staphylococcus species DETECTED (A) NOT DETECTED Final    Comment: CRITICAL RESULT CALLED TO, READ BACK BY AND VERIFIED WITH: E JACKSON,PHARMD'@0219'$  12/02/21 [place]    Staphylococcus aureus (BCID) NOT DETECTED NOT DETECTED Final   Staphylococcus epidermidis DETECTED (A) NOT DETECTED Final    Comment: CRITICAL RESULT CALLED TO, READ BACK BY AND VERIFIED WITH: E JACKSON,PHARMD'@0220'$  12/02/21 [place]    Staphylococcus lugdunensis NOT DETECTED NOT DETECTED Final   Streptococcus species NOT DETECTED NOT DETECTED Final   Streptococcus agalactiae NOT DETECTED NOT DETECTED Final   Streptococcus pneumoniae NOT DETECTED NOT DETECTED Final   Streptococcus pyogenes NOT DETECTED NOT DETECTED Final   A.calcoaceticus-baumannii NOT DETECTED NOT  DETECTED Final   Bacteroides fragilis NOT DETECTED NOT DETECTED Final   Enterobacterales NOT DETECTED NOT DETECTED Final   Enterobacter cloacae complex NOT DETECTED NOT DETECTED Final   Escherichia coli NOT DETECTED NOT DETECTED Final    Klebsiella aerogenes NOT DETECTED NOT DETECTED Final   Klebsiella oxytoca NOT DETECTED NOT DETECTED Final   Klebsiella pneumoniae NOT DETECTED NOT DETECTED Final   Proteus species NOT DETECTED NOT DETECTED Final   Salmonella species NOT DETECTED NOT DETECTED Final   Serratia marcescens NOT DETECTED NOT DETECTED Final   Haemophilus influenzae NOT DETECTED NOT DETECTED Final   Neisseria meningitidis NOT DETECTED NOT DETECTED Final   Pseudomonas aeruginosa NOT DETECTED NOT DETECTED Final   Stenotrophomonas maltophilia NOT DETECTED NOT DETECTED Final   Candida albicans NOT DETECTED NOT DETECTED Final   Candida auris NOT DETECTED NOT DETECTED Final   Candida glabrata NOT DETECTED NOT DETECTED Final   Candida krusei NOT DETECTED NOT DETECTED Final   Candida parapsilosis NOT DETECTED NOT DETECTED Final   Candida tropicalis NOT DETECTED NOT DETECTED Final   Cryptococcus neoformans/gattii NOT DETECTED NOT DETECTED Final   Methicillin resistance mecA/C NOT DETECTED NOT DETECTED Final    Comment: Performed at [facility], [address], [place], [place] [address]  Blood Culture (routine x 2)     Status: None (Preliminary result)   Collection Time: 12/01/21 12:54 AM   Specimen: BLOOD  Result Value Ref Range Status   Specimen Description   Final    BLOOD BLOOD LEFT HAND Performed at [place] [address], [place], [place] [address]    Special Requests   Final    BOTTLES DRAWN AEROBIC AND ANAEROBIC Blood Culture results may not be optimal due to an inadequate volume of blood received in culture bottles Performed at [place] [address], [place], [place] [address]    Culture   Final    NO GROWTH 1 DAY Performed at [facility], [place] [address], [place], [place] [address]    Report Status PENDING  Incomplete    Antimicrobials: Anti-infectives (From admission, onward)    Start     Dose/Rate Route Frequency Ordered Stop    12/01/21 1000  piperacillin-tazobactam (ZOSYN) IVPB 3.375 g        3.375 g 12.5 mL/hr over 240 Minutes Intravenous Every 8 hours 12/01/21 0546     12/01/21 0245  piperacillin-tazobactam (ZOSYN) IVPB 3.375 g        3.375 g 100 mL/hr over 30 Minutes Intravenous  Once 12/01/21 0241 12/01/21 0511      Culture/Microbiology    Component Value Date/Time   SDES  12/01/2021 0054    BLOOD BLOOD LEFT HAND Performed at [facility], [place] [address], [place], [place] [address]    SPECREQUEST  12/01/2021 0054    BOTTLES DRAWN AEROBIC AND ANAEROBIC Blood Culture results may not be optimal due to an inadequate volume of blood received in culture bottles Performed at [facility], [place] [address], [place], [place] [address]    CULT  12/01/2021 0054    NO GROWTH 1 DAY Performed at [place] [address], [place], [place] [address]    REPTSTATUS PENDING 12/01/2021 3818    Other culture-see note  Radiology Studies: CT ABDOMEN PELVIS W CONTRAST  Result Date: 12/01/2021 CLINICAL DATA:  Lower abdominal pain and nausea. EXAM: CT ABDOMEN AND PELVIS WITH CONTRAST TECHNIQUE: Multidetector CT imaging of  the abdomen and pelvis was performed using the standard protocol following bolus administration of intravenous contrast. RADIATION DOSE REDUCTION: This exam was performed according to the departmental dose-optimization program which includes automated exposure control, adjustment of the mA and/or kV according to patient size and/or use of iterative reconstruction technique. CONTRAST:  182m OMNIPAQUE IOHEXOL 300 MG/ML  SOLN COMPARISON:  November 28, 2007 FINDINGS: Lower chest: No acute abnormality. Hepatobiliary: A stable 1.2 cm focus of parenchymal low attenuation is seen within the posterior aspect of the right lobe of the liver. No gallstones, gallbladder wall thickening, or biliary dilatation. Pancreas: Unremarkable. No pancreatic ductal dilatation or surrounding  inflammatory changes. Spleen: Normal in size without focal abnormality. Adrenals/Urinary Tract: Adrenal glands are unremarkable. Kidneys are normal, without renal calculi, focal lesion, or hydronephrosis. The urinary bladder is limited secondary to overlying streak artifact and is otherwise unremarkable. Stomach/Bowel: Stomach is within normal limits. The appendix is not clearly identified. No evidence of bowel dilatation. Mildly inflamed diverticula are seen within the mid sigmoid colon. There is no evidence of associated perforation or abscess. This area is limited in evaluation secondary to the presence of overlying streak artifact. Noninflamed diverticula are seen throughout the remainder of the large bowel. Vascular/Lymphatic: Aortic atherosclerosis. No enlarged abdominal or pelvic lymph nodes. Reproductive: The uterus is not identified. Other: No abdominal wall hernia or abnormality. No abdominopelvic ascites. Musculoskeletal: Bilateral breast implants are seen. Bilateral total hip replacements are noted with associated streak artifact and subsequently limited evaluation of the adjacent osseous and soft tissue structures. There is mild to moderate severity levoscoliosis of the lower thoracic and upper lumbar spine with multilevel degenerative changes. IMPRESSION: 1. Mild sigmoid diverticulitis. 2. Stable hepatic cyst versus hemangioma. 3. Bilateral total hip replacements with associated streak artifact and subsequently limited evaluation of the adjacent osseous and soft tissue structures. 4. Aortic atherosclerosis. Aortic Atherosclerosis (ICD10-I70.0). Electronically Signed   By: TVirgina NorfolkM.D.   On: 12/01/2021 03:15   DG Chest Port 1 View  Result Date: 12/01/2021 CLINICAL DATA:  Syncope, vomiting EXAM: PORTABLE CHEST 1 VIEW COMPARISON:  None Available. FINDINGS: Lungs are clear.  No pleural effusion or pneumothorax. The heart is normal in size. IMPRESSION: No evidence of acute cardiopulmonary  disease. Electronically Signed   By: SJulian HyM.D.   On: 12/01/2021 00:53     LOS: 1 day   RAntonieta Pert MD [facility]  12/02/2021, 10:09 AM

## 2021-12-02 NOTE — Progress Notes (Signed)
PHARMACY - PHYSICIAN COMMUNICATION CRITICAL VALUE ALERT - BLOOD CULTURE IDENTIFICATION (BCID)  [name] is an 66 y.o. female who presented to [facility] on 11/30/2021 with a chief complaint of  LLQ abd pain, nausea, vomiting.  Assessment:  Staph epi, no resistance, anaerobic, 1/4  Name of physician (or Provider) Contacted: Clarene Essex  Current antibiotics: zosyn  Changes to prescribed antibiotics recommended:  None, probable contaminant  Results for orders placed or performed during the hospital encounter of 11/30/21  Blood Culture ID Panel (Reflexed) (Collected: 12/01/2021 12:46 AM)  Result Value Ref Range   Enterococcus faecalis NOT DETECTED NOT DETECTED   Enterococcus Faecium NOT DETECTED NOT DETECTED   Listeria monocytogenes NOT DETECTED NOT DETECTED   Staphylococcus species DETECTED (A) NOT DETECTED   Staphylococcus aureus (BCID) NOT DETECTED NOT DETECTED   Staphylococcus epidermidis DETECTED (A) NOT DETECTED   Staphylococcus lugdunensis NOT DETECTED NOT DETECTED   Streptococcus species NOT DETECTED NOT DETECTED   Streptococcus agalactiae NOT DETECTED NOT DETECTED   Streptococcus pneumoniae NOT DETECTED NOT DETECTED   Streptococcus pyogenes NOT DETECTED NOT DETECTED   A.calcoaceticus-baumannii NOT DETECTED NOT DETECTED   Bacteroides fragilis NOT DETECTED NOT DETECTED   Enterobacterales NOT DETECTED NOT DETECTED   Enterobacter cloacae complex NOT DETECTED NOT DETECTED   Escherichia coli NOT DETECTED NOT DETECTED   Klebsiella aerogenes NOT DETECTED NOT DETECTED   Klebsiella oxytoca NOT DETECTED NOT DETECTED   Klebsiella pneumoniae NOT DETECTED NOT DETECTED   Proteus species NOT DETECTED NOT DETECTED   Salmonella species NOT DETECTED NOT DETECTED   Serratia marcescens NOT DETECTED NOT DETECTED   Haemophilus influenzae NOT DETECTED NOT DETECTED   Neisseria meningitidis NOT DETECTED NOT DETECTED   Pseudomonas aeruginosa NOT DETECTED NOT DETECTED   Stenotrophomonas  maltophilia NOT DETECTED NOT DETECTED   Candida albicans NOT DETECTED NOT DETECTED   Candida auris NOT DETECTED NOT DETECTED   Candida glabrata NOT DETECTED NOT DETECTED   Candida krusei NOT DETECTED NOT DETECTED   Candida parapsilosis NOT DETECTED NOT DETECTED   Candida tropicalis NOT DETECTED NOT DETECTED   Cryptococcus neoformans/gattii NOT DETECTED NOT DETECTED   Methicillin resistance mecA/C NOT DETECTED NOT DETECTED   Dolly Rias RPh 12/02/2021, 2:24 AM

## 2021-12-02 NOTE — Plan of Care (Signed)

## 2021-12-02 NOTE — Progress Notes (Signed)
Initial Nutrition Assessment  DOCUMENTATION CODES:   Not applicable  INTERVENTION:  - continue Boost Breeze TID, each supplement provides 250 kcal and 9 grams of protein.  - will order 30 ml Prosource Plus once/day, each supplement provides 100 kcal and 15 grams protein.   - will order 1 tablet multivitamin with minerals/day.  - diet advancement as medically feasible.  - complete NFPE when feasible.   NUTRITION DIAGNOSIS:   Increased nutrient needs related to acute illness as evidenced by estimated needs.  GOAL:   Patient will meet greater than or equal to 90% of their needs  MONITOR:   PO intake, Supplement acceptance, Diet advancement, Labs, Weight trends  REASON FOR ASSESSMENT:   Malnutrition Screening Tool  ASSESSMENT:   66 year old female with medical history of small bowel obstruction d/t adhesions, HTN, asthma, arthritis, GERD, hypothyroidism, pancreatitis, traumatic back injury, and skin cancer. She presented to the ED due to syncope after donating plasma. She reported nausea and lower abdominal pain x1 week PTA. In the ED, CT renal and pelvis showed mild sigmoid diverticulitis and hepatic cyst versus hemangioma. She was admitted due to sepsis.  Unable to see patient at time of attempted visit. Diet advanced to CLD yesterday at 1430. No intakes documented since that time.   Boost Breeze ordered BID yesterday and patient has accepted both bottles offered so far.   She has not been seen by a [place] RD at any time in the past.   Weight on 7/24 was documented as 145 lb which appears to be a stated weight. MST report note indicates patient shared she has lost 10 lb in the past 1-2 months.  Weight on 09/07/21 was 149 lb. This indicates 4 lb weight loss (2.7% body weight) in the past 2.5 months. Non-pitting edema to RLE documented in the edema section of flow sheet.   Patient admitted due to sepsis 2/2 diverticulitis of sigmoid colon. GI signed off after assessment  this AM.    Labs reviewed; BUN: 7 mg/dl, Ca: 7.9 mg/dl.  Medications reviewed; 1 tablet calcium citrate/day, 0.5 mg folvite/day, 40 mg oral protonix/day, 1000 mcg oral cyanocobalamin/day, 220 mg zinc/day.   IVF; LR @ 50 ml/hr.    NUTRITION - FOCUSED PHYSICAL EXAM:  Unable at this time.   Diet Order:   Diet Order             Diet clear liquid Room service appropriate? Yes; Fluid consistency: Thin  Diet effective now                   EDUCATION NEEDS:   No education needs have been identified at this time  Skin:  Skin Assessment: Reviewed RN Assessment  Last BM:  7/26 (type 1 x2, both small amounts)  Height:   Ht Readings from Last 1 Encounters:  11/30/21 '5\' 3"'$  (1.6 m)    Weight:   Wt Readings from Last 1 Encounters:  11/30/21 65.8 kg     BMI:  Body mass index is 25.69 kg/m.  Estimated Nutritional Needs:  Kcal:  1900-2100 kcal Protein:  95-105 grams Fluid:  >/= 2.1 L/day      Jarome Matin, MS, RD, LDN, CNSC Registered Dietitian II Inpatient Clinical Nutrition RD pager # and on-call/weekend pager # available in [facility]

## 2021-12-03 DIAGNOSIS — A419 Sepsis, unspecified organism: Secondary | ICD-10-CM | POA: Diagnosis not present

## 2021-12-03 LAB — URINE CULTURE: Culture: >=100000 — AB

## 2021-12-03 LAB — CBC
HCT: 34.9 % — ABNORMAL LOW (ref 36.0–46.0)
Hemoglobin: 11.3 g/dL — ABNORMAL LOW (ref 12.0–15.0)
MCH: 30.2 pg (ref 26.0–34.0)
MCHC: 32.4 g/dL (ref 30.0–36.0)
MCV: 93.3 fL (ref 80.0–100.0)
Platelets: 203 10*3/uL (ref 150–400)
RBC: 3.74 MIL/uL — ABNORMAL LOW (ref 3.87–5.11)
RDW: 13.2 % (ref 11.5–15.5)
WBC: 6.5 10*3/uL (ref 4.0–10.5)
nRBC: 0 % (ref 0.0–0.2)

## 2021-12-03 LAB — BASIC METABOLIC PANEL
Anion gap: 12 (ref 5–15)
BUN: 8 mg/dL (ref 8–23)
CO2: 24 mmol/L (ref 22–32)
Calcium: 8 mg/dL — ABNORMAL LOW (ref 8.9–10.3)
Chloride: 105 mmol/L (ref 98–111)
Creatinine, Ser: 0.87 mg/dL (ref 0.44–1.00)
GFR, Estimated: >60 mL/min (ref >60)
Glucose, Bld: 129 mg/dL — ABNORMAL HIGH (ref 70–99)
Potassium: 3.8 mmol/L (ref 3.5–5.1)
Sodium: 141 mmol/L (ref 135–145)

## 2021-12-03 MED ORDER — SIMETHICONE 80 MG PO CHEW
80.0000 mg | CHEWABLE_TABLET | Freq: Once | ORAL | Status: AC
  Administered 2021-12-03: 80 mg via ORAL
  Filled 2021-12-03: qty 1

## 2021-12-03 MED ORDER — DIPHENHYDRAMINE HCL 25 MG PO CAPS
25.0000 mg | ORAL_CAPSULE | Freq: Once | ORAL | Status: AC
  Administered 2021-12-03: 25 mg via ORAL
  Filled 2021-12-03: qty 1

## 2021-12-03 NOTE — Progress Notes (Signed)
PROGRESS NOTE [name]  KAJ:[id number] DOB: Apr 23, 1956 DOA: 11/30/2021 PCP: [facility]   Brief Narrative/Hospital Course: 66 year old female with history of small bowel obstruction, hypertension, asthma, arthritis, GERD, hypothyroidism, history of total hip and knee arthroplasty came to the ED for evaluation of syncope after giving plasma. She normally donates plasma but started feeling dizzy with emesis, she has been having nausea past week and pain in the lower abdomen. In the ED hypotensive in 80s initially improved with a liter bolus.  Was having mild sinus tachycardia, leukocytosis normal lactic acid, CT renal pelvis with mild sigmoid diverticulitis, hepatic cyst versus hemangioma, bilateral hip replacement with history cardiac.  Placed on IV antibiotics and was admitted.     Subjective: Seen and examined this morning.  Still complains of pain in abdomen, tolerating diet would like to try fiber diet.   Has not noticed any blood in the stool now.  Assessment and Plan: Principal Problem:   Sepsis ([place]) Active Problems:   Diverticulitis of colon   Hypertension   Sigmoid diverticulitis   Sepsis poa 2/2 diverticulitis of colon Diverticulitis of sigmoid colon without complicating features Overall improving but still has some pain tolerating diet, as per GI recommendation advance diet to soft diet, GI has signed off.  Continue fentanyl, continue IV Zosyn.  She will need colonoscopy in 4 weeks to rule out GI malignancy as cause of diverticulitis this was discussed in detail with the patient will need  GI referral on d/c.  Rectal bleeding : She had solid BM with small frank blood 7/26 morning.  H&H stable GI evaluated, no recurrence.   Recent Labs  Lab 12/01/21 0028 12/02/21 0943 12/02/21 2136 12/03/21 0817  HGB 11.8* 11.0* 12.0 11.3*  HCT 36.4 34.4* 37.5 34.9*  Staph epi in blood culture BCID-no resistance , in anaerobic bottle 1/4: Likely contamination, already on  Zosyn as #1.  Follow-up culture  Streptococcus sanguinous > 100,000 in urine culture: ??  Etiology.  We will discuss with ID Dr Tommy Medal given her diverticulitis and  knee/hip arthroplasty.   Soft BP/history of Hypertension Sinus tachycardia: Vitals stable at this time.  Weaning IV fluids.     RLS-continue Requip Hypothyroidism: Continue her thyroid supplement  DVT prophylaxis: Place and maintain sequential compression device Start: 12/02/21 1018-refusing, add SCD, DC Lovenox due to rectal bleeding. Code Status:   Code Status: Full Code Family Communication: plan of care discussed with patient at bedside. Patient status is: Inpatient because of ongoing management of diverticulitis Level of care: Telemetry   Dispo: The patient is from: home            Anticipated disposition: home tomorrow if pain is stable.    Mobility Assessment (last 72 hours)     Mobility Assessment     Row Name 12/02/21 2000 12/02/21 1056 12/02/21 1053 12/01/21 2000 12/01/21 1533   Does patient have an order for bedrest or is patient medically unstable -- No - Continue assessment No - Continue assessment -- No - Continue assessment   What is the highest level of mobility based on the progressive mobility assessment? Level 6 (Walks independently in room and hall) - Balance while walking in room without assist - Complete Level 6 (Walks independently in room and hall) - Balance while walking in room without assist - Complete -- Level 6 (Walks independently in room and hall) - Balance while walking in room without assist - Complete Level 6 (Walks independently in room and hall) - Balance while  walking in room without assist - Complete             Objective: Vitals last 24 hrs: Vitals:   12/03/21 0600 12/03/21 0645 12/03/21 0729 12/03/21 1130  BP:      Pulse:      Resp: '19 16 20   '$ Temp:      TempSrc:      SpO2:    100%  Weight:      Height:       Weight change:   Physical Examination: General exam:  AAox3, older than stated age, weak appearing. HEENT:Oral mucosa moist, Ear/Nose WNL grossly, dentition normal. Respiratory system: bilaterally diminished, no use of accessory muscle Cardiovascular system: S1 & S2 +, No JVD,. Gastrointestinal system: Abdomen soft, tender on left abdomen ,ND,BS+ Nervous System:Alert, awake, moving extremities and grossly nonfocal Extremities: LE ankle edema neg, distal peripheral pulses palpable.  Skin: No rashes,no icterus. MSK: Normal muscle bulk,tone, power   Medications reviewed:  Scheduled Meds:  (feeding supplement) PROSource Plus  30 mL Oral Daily   calcium citrate  400 mg of elemental calcium Oral Daily   feeding supplement  1 Container Oral TID BM   folic acid  0.5 mg Oral Daily   multivitamin with minerals  1 tablet Oral Daily   pantoprazole  40 mg Oral Daily   rOPINIRole  1 mg Oral Daily   rOPINIRole  2 mg Oral QHS   saccharomyces boulardii  250 mg Oral BID   thyroid  105 mg Oral QAC breakfast   cyanocobalamin  1,000 mcg Oral Daily   zinc sulfate  220 mg Oral Daily   Continuous Infusions:  lactated ringers 50 mL/hr at 12/02/21 1715   piperacillin-tazobactam (ZOSYN)  IV 3.375 g (12/03/21 1017)      Diet Order             DIET SOFT Room service appropriate? Yes; Fluid consistency: Thin  Diet effective now                    Nutrition Problem: Increased nutrient needs Etiology: acute illness Signs/Symptoms: estimated needs Interventions: Boost Breeze, MVI, Prostat   Intake/Output Summary (Last 24 hours) at 12/03/2021 1146 Last data filed at 12/03/2021 1017 Gross per 24 hour  Intake 1959.87 ml  Output 3700 ml  Net -1740.13 ml   Net IO Since Admission: -5,498.36 mL [12/03/21 1146]  Wt Readings from Last 3 Encounters:  11/30/21 65.8 kg  09/07/21 67.6 kg  04/20/21 68.9 kg     Unresulted Labs (From admission, onward)     Start     Ordered   12/02/21 2200  Hemoglobin and hematocrit, blood  Now then every 8 hours,   R       12/02/21 1334   12/02/21 1610  Basic metabolic panel  Daily at 5am,   R      12/01/21 0720   12/02/21 0500  CBC  Daily at 5am,   R      12/01/21 0720          Data Reviewed: I have personally reviewed following labs and imaging studies CBC: Recent Labs  Lab 12/01/21 0028 12/02/21 0943 12/02/21 2136 12/03/21 0817  WBC 15.6* 4.7  --  6.5  NEUTROABS 11.4*  --   --   --   HGB 11.8* 11.0* 12.0 11.3*  HCT 36.4 34.4* 37.5 34.9*  MCV 94.1 95.0  --  93.3  PLT 213 196  --  203   Basic  Metabolic Panel: Recent Labs  Lab 12/01/21 0028 12/02/21 0943 12/03/21 0817  NA 142 144 141  K 4.1 3.9 3.8  CL 111 108 105  CO2 '25 28 24  '$ GLUCOSE 118* 148* 129*  BUN 15 7* 8  CREATININE 0.90 0.82 0.87  CALCIUM 7.0* 7.9* 8.0*   GFR: Estimated Creatinine Clearance: 60.4 mL/min (by C-G formula based on SCr of 0.87 mg/dL). Liver Function Tests: Recent Labs  Lab 12/01/21 0028 12/02/21 0943  AST 12* 16  ALT 10 12  ALKPHOS 41 38  BILITOT 0.5 0.5  PROT 5.0* 5.7*  ALBUMIN 3.0* 3.3*   No results for input(s): "LIPASE", "AMYLASE" in the last 168 hours. No results for input(s): "AMMONIA" in the last 168 hours. Coagulation Profile: Recent Labs  Lab 12/01/21 0040 12/02/21 0943  INR 1.0 1.0   BNP (last 3 results) No results for input(s): "PROBNP" in the last 8760 hours. HbA1C: No results for input(s): "HGBA1C" in the last 72 hours. CBG: Recent Labs  Lab 12/02/21 2058  GLUCAP 106*   Lipid Profile: No results for input(s): "CHOL", "HDL", "LDLCALC", "TRIG", "CHOLHDL", "LDLDIRECT" in the last 72 hours. Thyroid Function Tests: No results for input(s): "TSH", "T4TOTAL", "FREET4", "T3FREE", "THYROIDAB" in the last 72 hours. Sepsis Labs: Recent Labs  Lab 12/01/21 0046 12/01/21 0322 12/02/21 0943  PROCALCITON  --   --  <0.10  LATICACIDVEN 1.0 0.8  --     Recent Results (from the past 240 hour(s))  Urine Culture     Status: Abnormal   Collection Time: 12/01/21 12:24 AM    Specimen: In/Out Cath Urine  Result Value Ref Range Status   Specimen Description   Final    IN/OUT CATH URINE Performed at [facility], [place] [address] [place], [place] [address]    Special Requests   Final    NONE Performed at [facility], [place] [address], [place], [place] [address]    Culture (A)  Final    >=100,000 COLONIES/mL STREPTOCOCCUS SANGUINIS Usually susceptible to penicillin and other beta lactam agents,quinolones,macrolides and tetracyclines. Performed at [facility], [place] [address], [place], [place] [address]    Report Status 12/03/2021 FINAL  Final  Blood Culture (routine x 2)     Status: Abnormal (Preliminary result)   Collection Time: 12/01/21 12:46 AM   Specimen: BLOOD  Result Value Ref Range Status   Specimen Description   Final    BLOOD LEFT ANTECUBITAL Performed at [place] [address] [place], [place] [address]    Special Requests   Final    BOTTLES DRAWN AEROBIC AND ANAEROBIC Blood Culture results may not be optimal due to an excessive volume of blood received in culture bottles Performed at [place] [address], [place], [place] [address]    Culture  Setup Time   Final    GRAM POSITIVE COCCI IN CLUSTERS ANAEROBIC BOTTLE ONLY Organism ID to follow CRITICAL RESULT CALLED TO, READ BACK BY AND VERIFIED WITH: E JACKSON,PHARMD'@0218'$  12/02/21 [place]    Culture (A)  Final    STAPHYLOCOCCUS EPIDERMIDIS THE SIGNIFICANCE OF ISOLATING THIS ORGANISM FROM A SINGLE SET OF BLOOD CULTURES WHEN MULTIPLE SETS ARE DRAWN IS UNCERTAIN. PLEASE NOTIFY THE MICROBIOLOGY DEPARTMENT WITHIN ONE WEEK IF SPECIATION AND SENSITIVITIES ARE REQUIRED. Performed at [facility], [place] [address], [place],  [address]    Report Status PENDING  Incomplete  Blood Culture ID Panel (Reflexed)     Status:  Abnormal   Collection Time: 12/01/21 12:46 AM  Result Value Ref Range Status    Enterococcus faecalis NOT DETECTED NOT DETECTED Final   Enterococcus Faecium NOT DETECTED NOT DETECTED Final   Listeria monocytogenes NOT DETECTED NOT DETECTED Final   Staphylococcus species DETECTED (A) NOT DETECTED Final    Comment: CRITICAL RESULT CALLED TO, READ BACK BY AND VERIFIED WITH: E JACKSON,PHARMD'@0219'$  12/02/21 [place]    Staphylococcus aureus (BCID) NOT DETECTED NOT DETECTED Final   Staphylococcus epidermidis DETECTED (A) NOT DETECTED Final    Comment: CRITICAL RESULT CALLED TO, READ BACK BY AND VERIFIED WITH: E JACKSON,PHARMD'@0220'$  12/02/21 [place]    Staphylococcus lugdunensis NOT DETECTED NOT DETECTED Final   Streptococcus species NOT DETECTED NOT DETECTED Final   Streptococcus agalactiae NOT DETECTED NOT DETECTED Final   Streptococcus pneumoniae NOT DETECTED NOT DETECTED Final   Streptococcus pyogenes NOT DETECTED NOT DETECTED Final   A.calcoaceticus-baumannii NOT DETECTED NOT DETECTED Final   Bacteroides fragilis NOT DETECTED NOT DETECTED Final   Enterobacterales NOT DETECTED NOT DETECTED Final   Enterobacter cloacae complex NOT DETECTED NOT DETECTED Final   Escherichia coli NOT DETECTED NOT DETECTED Final   Klebsiella aerogenes NOT DETECTED NOT DETECTED Final   Klebsiella oxytoca NOT DETECTED NOT DETECTED Final   Klebsiella pneumoniae NOT DETECTED NOT DETECTED Final   Proteus species NOT DETECTED NOT DETECTED Final   Salmonella species NOT DETECTED NOT DETECTED Final   Serratia marcescens NOT DETECTED NOT DETECTED Final   Haemophilus influenzae NOT DETECTED NOT DETECTED Final   Neisseria meningitidis NOT DETECTED NOT DETECTED Final   Pseudomonas aeruginosa NOT DETECTED NOT DETECTED Final   Stenotrophomonas maltophilia NOT DETECTED NOT DETECTED Final   Candida albicans NOT DETECTED NOT DETECTED Final   Candida auris NOT DETECTED NOT DETECTED Final   Candida glabrata NOT DETECTED NOT DETECTED Final   Candida krusei NOT DETECTED NOT DETECTED Final   Candida parapsilosis NOT  DETECTED NOT DETECTED Final   Candida tropicalis NOT DETECTED NOT DETECTED Final   Cryptococcus neoformans/gattii NOT DETECTED NOT DETECTED Final   Methicillin resistance mecA/C NOT DETECTED NOT DETECTED Final    Comment: Performed at [facility], [address], [place], [place] [address]  Blood Culture (routine x 2)     Status: None (Preliminary result)   Collection Time: 12/01/21 12:54 AM   Specimen: BLOOD  Result Value Ref Range Status   Specimen Description   Final    BLOOD BLOOD LEFT HAND Performed at [facility] [address], [place], [place] [address]    Special Requests   Final    BOTTLES DRAWN AEROBIC AND ANAEROBIC Blood Culture results may not be optimal due to an inadequate volume of blood received in culture bottles Performed at [facility] [place] [address] [place], [place] [address]    Culture   Final    NO GROWTH 2 DAYS Performed at [place] [address], [place], [place] [address]    Report Status PENDING  Incomplete    Antimicrobials: Anti-infectives (From admission, onward)    Start     Dose/Rate Route Frequency Ordered Stop   12/01/21 1000  piperacillin-tazobactam (ZOSYN) IVPB 3.375 g        3.375 g 12.5 mL/hr over 240 Minutes Intravenous Every 8 hours 12/01/21 0546     12/01/21 0245  piperacillin-tazobactam (ZOSYN) IVPB 3.375 g        3.375 g 100 mL/hr over 30 Minutes Intravenous  Once 12/01/21 0241  12/01/21 0511      Culture/Microbiology    Component Value Date/Time   SDES  12/01/2021 0054    BLOOD BLOOD LEFT HAND Performed at [facility], [place] [address] [place], [place] [address]    SPECREQUEST  12/01/2021 0054    BOTTLES DRAWN AEROBIC AND ANAEROBIC Blood Culture results may not be optimal due to an inadequate volume of blood received in culture bottles Performed at [facility], [place] [address] [place], [place] [address]    CULT  12/01/2021 0054     NO GROWTH 2 DAYS Performed at [facility], [place] [address], [place], [place] [address]    REPTSTATUS PENDING 12/01/2021 6067    Other culture-see note  Radiology Studies: No results found.   LOS: 2 days   Antonieta Pert, MD [facility]  12/03/2021, 11:46 AM

## 2021-12-04 ENCOUNTER — Telehealth: Payer: Self-pay | Admitting: Obstetrics & Gynecology

## 2021-12-04 ENCOUNTER — Encounter: Payer: Self-pay | Admitting: Obstetrics & Gynecology

## 2021-12-04 ENCOUNTER — Other Ambulatory Visit (HOSPITAL_COMMUNITY): Payer: Self-pay

## 2021-12-04 DIAGNOSIS — K5732 Diverticulitis of large intestine without perforation or abscess without bleeding: Secondary | ICD-10-CM | POA: Diagnosis not present

## 2021-12-04 LAB — CULTURE, BLOOD (ROUTINE X 2)

## 2021-12-04 MED ORDER — AMOXICILLIN-POT CLAVULANATE 875-125 MG PO TABS
1.0000 | ORAL_TABLET | Freq: Two times a day (BID) | ORAL | 0 refills | Status: AC
  Filled 2021-12-04: qty 14, 7d supply, fill #0

## 2021-12-04 MED ORDER — SACCHAROMYCES BOULARDII 250 MG PO CAPS
250.0000 mg | ORAL_CAPSULE | Freq: Two times a day (BID) | ORAL | 0 refills | Status: AC
  Filled 2021-12-04: qty 20, 10d supply, fill #0

## 2021-12-04 MED ORDER — AMOXICILLIN-POT CLAVULANATE 875-125 MG PO TABS
1.0000 | ORAL_TABLET | Freq: Two times a day (BID) | ORAL | Status: DC
  Administered 2021-12-04: 1 via ORAL
  Filled 2021-12-04: qty 1

## 2021-12-04 NOTE — Telephone Encounter (Signed)
I called [name] on Friday, July 28 to discuss plans for a repeat Pap smear.  Patient has had dysplasia in the past and then had a hysterectomy.  [facility].  Her vaginal Pap smear at our office most recently showed low-grade cells and HPV negative.  Patient is known to have moderate to severe vaginal atrophy.  She is unable to afford vaginal estrogen in the past.  I do think that she would qualify for the [name] patient assistance.  I sent this link to her through her MyChart account.  I would like to send in a prescription for Premarin to be used nightly for 2 weeks then twice a week for the next 2-1/2 months.  I would then repeat the Pap smear at that point to see if the abnormal cells had cleared.  Patient is currently admitted to the hospital at [facility] for sepsis.  We will continue to reach out until we successively communicate with her through telephone or [name].

## 2021-12-04 NOTE — Discharge Summary (Signed)
Physician Discharge Summary  NANNIE STARZYK HER:[id number] DOB: 04/03/1956 DOA: 11/30/2021  PCP: [facility]  Admit date: 11/30/2021 Discharge date: 12/04/2021 Recommendations for Outpatient Follow-up:  Follow up with PCP in 1 weeks-call for appointment Please obtain BMP/CBC in one week  Discharge Dispo: home Discharge Condition: Stable Code Status:   Code Status: Full Code Diet recommendation:  Diet Order             DIET SOFT Room service appropriate? Yes; Fluid consistency: Thin  Diet effective now                    Brief/Interim Summary: 66 year old female with history of small bowel obstruction, hypertension, asthma, arthritis, GERD, hypothyroidism, history of total hip and knee arthroplasty came to the ED for evaluation of syncope after giving plasma. She normally donates plasma but started feeling dizzy with emesis, she has been having nausea past week and pain in the lower abdomen. In the ED hypotensive in 80s initially improved with a liter bolus.  Was having mild sinus tachycardia, leukocytosis normal lactic acid, CT renal pelvis with mild sigmoid diverticulitis, hepatic cyst versus hemangioma, bilateral hip replacement with history cardiac.  Placed on IV antibiotics and was admitted.  Diet was slowly advanced seen by GI, patient now tolerating diet, on soft diet pain is stable improved, no fever.  We will do oral trial of Augmentin before discharge home to make sure she is able to tolerate p.o. antibiotics as well.  Patient instructed with daughters presence  today that she needs colonoscopy in 4 weeks to rule out malignancy     Discharge Diagnoses:  Principal Problem:   Sepsis ([place]) Active Problems:   Diverticulitis of colon   Hypertension   Sigmoid diverticulitis  Sepsis poa 2/2 diverticulitis of colon Diverticulitis of sigmoid colon without complicating features Placed on IV antibiotics and was admitted.  Diet was slowly advanced seen by GI, patient  now tolerating diet, on soft diet pain is stable improved, no fever.  We will do oral trial of Augmentin before discharge home to make sure she is able to tolerate p.o. antibiotics as well.  Patient instructed with daughters presence  today that she needs colonoscopy in 4 weeks to rule out malignancy.  Patient understands that sometimes despite being on oral antibiotics patient can have worsening of infection that could lead to abscess I have explained the signs and symptoms of abscess and if she notices any of those symptoms she knows that she needs to to return to the ED as soon as possible  or contact her MD as she may need further CT scan.  Sent prescription for 7 more days of Augmentin, see has gotten 4 days of antibiotics already   Rectal bleeding : She had solid BM with small frank blood 7/26 morning.  No bleeding since.  H&H stable GI evaluated, no recurrence.  Outpatient follow-up Recent Labs  Lab 12/01/21 0028 12/02/21 0943 12/02/21 2136 12/03/21 0817  HGB 11.8* 11.0* 12.0 11.3*  HCT 36.4 34.4* 37.5 34.9*    Staph epi in blood culture BCID-no resistance , in anaerobic bottle 1/4: Likely contamination, already on Zosyn as #1.  Follow-up culture otherwise negative   Streptococcus sanguinous > 100,000 in urine culture: Likely contamination discussed with ID Dr Tommy Medal -does not advise to pursue further work-up    Soft BP/history of Hypertension Sinus tachycardia: Vitals stable at this time.  Weaning IV fluids.     RLS-continue Requip Hypothyroidism: Continue her thyroid  supplement  Consults: GI Subjective: Tolerating diet, pain is controlled,   Discharge Exam: Vitals:   12/04/21 0608 12/04/21 0757  BP: 122/85 130/86  Pulse: 82 83  Resp: 16 18  Temp: 98 F (36.7 C)   SpO2: 96% 99%   General: Pt is alert, awake, not in acute distress Cardiovascular: RRR, S1/S2 +, no rubs, no gallops Respiratory: CTA bilaterally, no wheezing, no rhonchi Abdominal: Soft, NT, ND, bowel  sounds + Extremities: no edema, no cyanosis  Discharge Instructions  Discharge Instructions     Discharge instructions   Complete by: As directed    Please call call MD or return to ER for similar or worsening recurring problem that brought you to hospital or if any fever,nausea/vomiting,abdominal pain, uncontrolled pain, chest pain,  shortness of breath or any other alarming symptoms.  He will need colonoscopy in 4 to 6 weeks please contact your [facility] MD if not contact the phone number provided-[facility]  Please follow-up your doctor as instructed in a week time and call the office for appointment.  Please avoid alcohol, smoking, or any other illicit substance and maintain healthy habits including taking your regular medications as prescribed.  You were cared for by a hospitalist during your hospital stay. If you have any questions about your discharge medications or the care you received while you were in the hospital after you are discharged, you can call the unit and ask to speak with the hospitalist on call if the hospitalist that took care of you is not available.  Once you are discharged, your primary care physician will handle any further medical issues. Please note that NO REFILLS for any discharge medications will be authorized once you are discharged, as it is imperative that you return to your primary care physician (or establish a relationship with a primary care physician if you do not have one) for your aftercare needs so that they can reassess your need for medications and monitor your lab values   Increase activity slowly   Complete by: As directed       Allergies as of 12/04/2021       Reactions   Anesthetics, Ester Nausea And Vomiting   Lidocaine Nausea And Vomiting   Other Cough   Refuse blood or blood products   Oxycodone-acetaminophen Rash   Sulfa Antibiotics Hives   Sulfasalazine Hives, Rash   Amlodipine Swelling   Codeine Hives   Hydrocodone  Nausea And Vomiting   Lisinopril Itching   Can take split doses-takes 10 in am and 10 in evening   Oxycodone Nausea And Vomiting   Prednisone    Causes her to have vaginal bleeding   Ropinirole Nausea And Vomiting, Nausea Only        Medication List     TAKE these medications    albuterol 108 (90 Base) MCG/ACT inhaler Commonly known as: VENTOLIN HFA Inhale 1-2 puffs into the lungs every 6 (six) hours as needed for wheezing or shortness of breath.   amoxicillin-clavulanate 875-125 MG tablet Commonly known as: AUGMENTIN Take 1 tablet by mouth every 12 (twelve) hours for 7 days.   BIOTIN PO Take 1 tablet by mouth daily.   BORON PO Take 1 tablet by mouth daily.   calcium citrate 950 (200 Ca) MG tablet Commonly known as: CALCITRATE - dosed in mg elemental calcium Take 400 mg of elemental calcium by mouth daily.   Co Q-10 300 MG Caps Take 300 mg by mouth daily.   cyanocobalamin 1000 MCG tablet  Commonly known as: VITAMIN B12 Take 1,000 mcg by mouth daily.   esomeprazole 20 MG capsule Commonly known as: NEXIUM Take 20 mg by mouth daily with breakfast.   estradiol 0.1 MG/24HR patch Commonly known as: VIVELLE-DOT Place 1 patch (0.1 mg total) onto the skin 2 (two) times a week. What changed: additional instructions   folic acid 932 MCG tablet Commonly known as: FOLVITE Take 400 mcg by mouth daily.   GLUTATHIONE PO Take 2 tablets by mouth daily.   KRILL OIL PO Take 1 capsule by mouth daily.   L-Arginine 1000 MG Tabs Take 1,000 mg by mouth daily.   lisinopril 10 MG tablet Commonly known as: ZESTRIL Take 10 mg by mouth 3 (three) times daily.   MAGNESIUM CITRATE PO Take 1 tablet by mouth daily.   montelukast 10 MG tablet Commonly known as: SINGULAIR Take 10 mg by mouth daily as needed (shortness of breath).   NIACINAMIDE PO Take 1 tablet by mouth daily.   NP Thyroid 15 MG tablet Generic drug: thyroid Take 15 mg by mouth daily.   NP Thyroid 90 MG  tablet Generic drug: thyroid Take 90 mg by mouth daily.   OVER THE COUNTER MEDICATION Take 1 tablet by mouth daily. Beet root   rOPINIRole 1 MG tablet Commonly known as: REQUIP Take 1-2 mg by mouth See admin instructions. 1 mg in the morning, 2 mg in the evening.   saccharomyces boulardii 250 MG capsule Commonly known as: Florastor Take 1 capsule (250 mg total) by mouth 2 (two) times daily for 10 days.   traMADol 50 MG tablet Commonly known as: ULTRAM Take 50 mg by mouth every 12 (twelve) hours as needed for moderate pain.   VITAMIN A PO Take 1 tablet by mouth daily.   VITAMIN B-1 PO Take 1 tablet by mouth daily.   VITAMIN B-3 PO Take 1 tablet by mouth daily.   vitamin E 1000 UNIT capsule Take 1,000 Units by mouth daily.   zinc gluconate 50 MG tablet Take 50 mg by mouth daily.        Allergies  Allergen Reactions   Anesthetics, Ester Nausea And Vomiting   Lidocaine Nausea And Vomiting   Other Cough    Refuse blood or blood products   Oxycodone-Acetaminophen Rash   Sulfa Antibiotics Hives   Sulfasalazine Hives and Rash   Amlodipine Swelling   Codeine Hives   Hydrocodone Nausea And Vomiting   Lisinopril Itching    Can take split doses-takes 10 in am and 10 in evening   Oxycodone Nausea And Vomiting   Prednisone     Causes her to have vaginal bleeding   Ropinirole Nausea And Vomiting and Nausea Only    The results of significant diagnostics from this hospitalization (including imaging, microbiology, ancillary and laboratory) are listed below for reference.    Microbiology: Recent Results (from the past 240 hour(s))  Urine Culture     Status: Abnormal   Collection Time: 12/01/21 12:24 AM   Specimen: In/Out Cath Urine  Result Value Ref Range Status   Specimen Description   Final    IN/OUT CATH URINE Performed at [facility], [place] [address], [place], [place] [address]    Special Requests   Final    NONE Performed at [facility], [place] [address] [place], [place] [address]    Culture (A)  Final    >=100,000 COLONIES/mL STREPTOCOCCUS SANGUINIS Usually susceptible to penicillin and other beta lactam agents,quinolones,macrolides and  tetracyclines. Performed at [facility], [place] [address], [place], [place] [address]    Report Status 12/03/2021 FINAL  Final  Blood Culture (routine x 2)     Status: Abnormal   Collection Time: 12/01/21 12:46 AM   Specimen: BLOOD  Result Value Ref Range Status   Specimen Description   Final    BLOOD LEFT ANTECUBITAL Performed at [place] [address] [place], [place] [address]    Special Requests   Final    BOTTLES DRAWN AEROBIC AND ANAEROBIC Blood Culture results may not be optimal due to an excessive volume of blood received in culture bottles Performed at [place] [address] [place], [place] [address]    Culture  Setup Time   Final    GRAM POSITIVE COCCI IN CLUSTERS ANAEROBIC BOTTLE ONLY Organism ID to follow CRITICAL RESULT CALLED TO, READ BACK BY AND VERIFIED WITH: E JACKSON,PHARMD'@0218'$  12/02/21 [place]    Culture (A)  Final    STAPHYLOCOCCUS EPIDERMIDIS THE SIGNIFICANCE OF ISOLATING THIS ORGANISM FROM A SINGLE SET OF BLOOD CULTURES WHEN MULTIPLE SETS ARE DRAWN IS UNCERTAIN. PLEASE NOTIFY THE MICROBIOLOGY DEPARTMENT WITHIN ONE WEEK IF SPECIATION AND SENSITIVITIES ARE REQUIRED. Performed at [facility], [place] [address] [place], [place] [address]    Report Status 12/04/2021 FINAL  Final  Blood Culture ID Panel (Reflexed)     Status: Abnormal   Collection Time: 12/01/21 12:46 AM  Result Value Ref Range Status   Enterococcus faecalis NOT DETECTED NOT DETECTED Final   Enterococcus Faecium NOT DETECTED NOT DETECTED Final   Listeria monocytogenes NOT DETECTED NOT DETECTED Final   Staphylococcus species DETECTED (A) NOT DETECTED Final    Comment: CRITICAL RESULT CALLED TO, READ BACK BY AND  VERIFIED WITH: E JACKSON,PHARMD'@0219'$  12/02/21 [place]    Staphylococcus aureus (BCID) NOT DETECTED NOT DETECTED Final   Staphylococcus epidermidis DETECTED (A) NOT DETECTED Final    Comment: CRITICAL RESULT CALLED TO, READ BACK BY AND VERIFIED WITH: E JACKSON,PHARMD'@0220'$  12/02/21 [place]    Staphylococcus lugdunensis NOT DETECTED NOT DETECTED Final   Streptococcus species NOT DETECTED NOT DETECTED Final   Streptococcus agalactiae NOT DETECTED NOT DETECTED Final   Streptococcus pneumoniae NOT DETECTED NOT DETECTED Final   Streptococcus pyogenes NOT DETECTED NOT DETECTED Final   A.calcoaceticus-baumannii NOT DETECTED NOT DETECTED Final   Bacteroides fragilis NOT DETECTED NOT DETECTED Final   Enterobacterales NOT DETECTED NOT DETECTED Final   Enterobacter cloacae complex NOT DETECTED NOT DETECTED Final   Escherichia coli NOT DETECTED NOT DETECTED Final   Klebsiella aerogenes NOT DETECTED NOT DETECTED Final   Klebsiella oxytoca NOT DETECTED NOT DETECTED Final   Klebsiella pneumoniae NOT DETECTED NOT DETECTED Final   Proteus species NOT DETECTED NOT DETECTED Final   Salmonella species NOT DETECTED NOT DETECTED Final   Serratia marcescens NOT DETECTED NOT DETECTED Final   Haemophilus influenzae NOT DETECTED NOT DETECTED Final   Neisseria meningitidis NOT DETECTED NOT DETECTED Final   Pseudomonas aeruginosa NOT DETECTED NOT DETECTED Final   Stenotrophomonas maltophilia NOT DETECTED NOT DETECTED Final   Candida albicans NOT DETECTED NOT DETECTED Final   Candida auris NOT DETECTED NOT DETECTED Final   Candida glabrata NOT DETECTED NOT DETECTED Final   Candida krusei NOT DETECTED NOT DETECTED Final   Candida parapsilosis NOT DETECTED NOT DETECTED Final   Candida tropicalis NOT DETECTED NOT DETECTED Final   Cryptococcus neoformans/gattii NOT DETECTED NOT DETECTED Final   Methicillin resistance mecA/C NOT DETECTED  NOT DETECTED Final    Comment: Performed at [facility], [place] [address]  [place], [place] [address]  Blood Culture (routine x 2)     Status: None (Preliminary result)   Collection Time: 12/01/21 12:54 AM   Specimen: BLOOD  Result Value Ref Range Status   Specimen Description   Final    BLOOD BLOOD LEFT HAND Performed at [place] [address], [place], [place] [address]    Special Requests   Final    BOTTLES DRAWN AEROBIC AND ANAEROBIC Blood Culture results may not be optimal due to an inadequate volume of blood received in culture bottles Performed at [place] [address] [place], [place] [address]    Culture   Final    NO GROWTH 3 DAYS Performed at [facility], [place] [address] [place], [place] [address]    Report Status PENDING  Incomplete    Procedures/Studies: CT ABDOMEN PELVIS W CONTRAST  Result Date: 12/01/2021 CLINICAL DATA:  Lower abdominal pain and nausea. EXAM: CT ABDOMEN AND PELVIS WITH CONTRAST TECHNIQUE: Multidetector CT imaging of the abdomen and pelvis was performed using the standard protocol following bolus administration of intravenous contrast. RADIATION DOSE REDUCTION: This exam was performed according to the departmental dose-optimization program which includes automated exposure control, adjustment of the mA and/or kV according to patient size and/or use of iterative reconstruction technique. CONTRAST:  132m OMNIPAQUE IOHEXOL 300 MG/ML  SOLN COMPARISON:  November 28, 2007 FINDINGS: Lower chest: No acute abnormality. Hepatobiliary: A stable 1.2 cm focus of parenchymal low attenuation is seen within the posterior aspect of the right lobe of the liver. No gallstones, gallbladder wall thickening, or biliary dilatation. Pancreas: Unremarkable. No pancreatic ductal dilatation or surrounding inflammatory changes. Spleen: Normal in size without focal abnormality. Adrenals/Urinary Tract: Adrenal glands are unremarkable. Kidneys are normal, without renal calculi, focal lesion, or hydronephrosis. The  urinary bladder is limited secondary to overlying streak artifact and is otherwise unremarkable. Stomach/Bowel: Stomach is within normal limits. The appendix is not clearly identified. No evidence of bowel dilatation. Mildly inflamed diverticula are seen within the mid sigmoid colon. There is no evidence of associated perforation or abscess. This area is limited in evaluation secondary to the presence of overlying streak artifact. Noninflamed diverticula are seen throughout the remainder of the large bowel. Vascular/Lymphatic: Aortic atherosclerosis. No enlarged abdominal or pelvic lymph nodes. Reproductive: The uterus is not identified. Other: No abdominal wall hernia or abnormality. No abdominopelvic ascites. Musculoskeletal: Bilateral breast implants are seen. Bilateral total hip replacements are noted with associated streak artifact and subsequently limited evaluation of the adjacent osseous and soft tissue structures. There is mild to moderate severity levoscoliosis of the lower thoracic and upper lumbar spine with multilevel degenerative changes. IMPRESSION: 1. Mild sigmoid diverticulitis. 2. Stable hepatic cyst versus hemangioma. 3. Bilateral total hip replacements with associated streak artifact and subsequently limited evaluation of the adjacent osseous and soft tissue structures. 4. Aortic atherosclerosis. Aortic Atherosclerosis (ICD10-I70.0). Electronically Signed   By: TVirgina NorfolkM.D.   On: 12/01/2021 03:15   DG Chest Port 1 View  Result Date: 12/01/2021 CLINICAL DATA:  Syncope, vomiting EXAM: PORTABLE CHEST 1 VIEW COMPARISON:  None Available. FINDINGS: Lungs are clear.  No pleural effusion or pneumothorax. The heart is normal in size. IMPRESSION: No evidence of acute cardiopulmonary disease. Electronically Signed   By: SJulian HyM.D.   On: 12/01/2021 00:53    Labs: BNP (last 3 results) No  results for input(s): "BNP" in the last 8760 hours. Basic Metabolic Panel: Recent Labs   Lab 12/01/21 0028 12/02/21 0943 12/03/21 0817  NA 142 144 141  K 4.1 3.9 3.8  CL 111 108 105  CO2 '25 28 24  '$ GLUCOSE 118* 148* 129*  BUN 15 7* 8  CREATININE 0.90 0.82 0.87  CALCIUM 7.0* 7.9* 8.0*   Liver Function Tests: Recent Labs  Lab 12/01/21 0028 12/02/21 0943  AST 12* 16  ALT 10 12  ALKPHOS 41 38  BILITOT 0.5 0.5  PROT 5.0* 5.7*  ALBUMIN 3.0* 3.3*   No results for input(s): "LIPASE", "AMYLASE" in the last 168 hours. No results for input(s): "AMMONIA" in the last 168 hours. CBC: Recent Labs  Lab 12/01/21 0028 12/02/21 0943 12/02/21 2136 12/03/21 0817  WBC 15.6* 4.7  --  6.5  NEUTROABS 11.4*  --   --   --   HGB 11.8* 11.0* 12.0 11.3*  HCT 36.4 34.4* 37.5 34.9*  MCV 94.1 95.0  --  93.3  PLT 213 196  --  203   Cardiac Enzymes: No results for input(s): "CKTOTAL", "CKMB", "CKMBINDEX", "TROPONINI" in the last 168 hours. BNP: Invalid input(s): "POCBNP" CBG: Recent Labs  Lab 12/02/21 2058  GLUCAP 106*   D-Dimer No results for input(s): "DDIMER" in the last 72 hours. Hgb A1c No results for input(s): "HGBA1C" in the last 72 hours. Lipid Profile No results for input(s): "CHOL", "HDL", "LDLCALC", "TRIG", "CHOLHDL", "LDLDIRECT" in the last 72 hours. Thyroid function studies No results for input(s): "TSH", "T4TOTAL", "T3FREE", "THYROIDAB" in the last 72 hours.  Invalid input(s): "FREET3" Anemia work up No results for input(s): "VITAMINB12", "FOLATE", "FERRITIN", "TIBC", "IRON", "RETICCTPCT" in the last 72 hours. Urinalysis    Component Value Date/Time   COLORURINE YELLOW 12/01/2021 0024   APPEARANCEUR CLEAR 12/01/2021 0024   LABSPEC 1.011 12/01/2021 0024   PHURINE 5.0 12/01/2021 0024   GLUCOSEU NEGATIVE 12/01/2021 0024   HGBUR NEGATIVE 12/01/2021 0024   BILIRUBINUR NEGATIVE 12/01/2021 0024   KETONESUR NEGATIVE 12/01/2021 0024   PROTEINUR NEGATIVE 12/01/2021 0024   NITRITE NEGATIVE 12/01/2021 0024   LEUKOCYTESUR NEGATIVE 12/01/2021 0024   Sepsis  Labs Recent Labs  Lab 12/01/21 0028 12/02/21 0943 12/03/21 0817  WBC 15.6* 4.7 6.5   Microbiology Recent Results (from the past 240 hour(s))  Urine Culture     Status: Abnormal   Collection Time: 12/01/21 12:24 AM   Specimen: In/Out Cath Urine  Result Value Ref Range Status   Specimen Description   Final    IN/OUT CATH URINE Performed at [facility], [place] [address], [place], [place] [address]    Special Requests   Final    NONE Performed at [facility], [place] [address] [place], [place] [address]    Culture (A)  Final    >=100,000 COLONIES/mL STREPTOCOCCUS SANGUINIS Usually susceptible to penicillin and other beta lactam agents,quinolones,macrolides and tetracyclines. Performed at [facility], [place] [address] [place], [place] [address]    Report Status 12/03/2021 FINAL  Final  Blood Culture (routine x 2)     Status: Abnormal   Collection Time: 12/01/21 12:46 AM   Specimen: BLOOD  Result Value Ref Range Status   Specimen Description   Final    BLOOD LEFT ANTECUBITAL Performed at [place] [address] [place], [place] [address]    Special Requests   Final    BOTTLES DRAWN AEROBIC AND ANAEROBIC Blood Culture results may not  be optimal due to an excessive volume of blood received in culture bottles Performed at [place] [address], [place], [place] [address]    Culture  Setup Time   Final    GRAM POSITIVE COCCI IN CLUSTERS ANAEROBIC BOTTLE ONLY Organism ID to follow CRITICAL RESULT CALLED TO, READ BACK BY AND VERIFIED WITH: E JACKSON,PHARMD'@0218'$  12/02/21 [place]    Culture (A)  Final    STAPHYLOCOCCUS EPIDERMIDIS THE SIGNIFICANCE OF ISOLATING THIS ORGANISM FROM A SINGLE SET OF BLOOD CULTURES WHEN MULTIPLE SETS ARE DRAWN IS UNCERTAIN. PLEASE NOTIFY THE MICROBIOLOGY DEPARTMENT WITHIN ONE WEEK IF SPECIATION AND SENSITIVITIES ARE REQUIRED. Performed at [facility], [place] [address], [place], [place] [address]    Report Status 12/04/2021 FINAL  Final  Blood Culture ID Panel (Reflexed)     Status: Abnormal   Collection Time: 12/01/21 12:46 AM  Result Value Ref Range Status   Enterococcus faecalis NOT DETECTED NOT DETECTED Final   Enterococcus Faecium NOT DETECTED NOT DETECTED Final   Listeria monocytogenes NOT DETECTED NOT DETECTED Final   Staphylococcus species DETECTED (A) NOT DETECTED Final    Comment: CRITICAL RESULT CALLED TO, READ BACK BY AND VERIFIED WITH: E JACKSON,PHARMD'@0219'$  12/02/21 [place]    Staphylococcus aureus (BCID) NOT DETECTED NOT DETECTED Final   Staphylococcus epidermidis DETECTED (A) NOT DETECTED Final    Comment: CRITICAL RESULT CALLED TO, READ BACK BY AND VERIFIED WITH: E JACKSON,PHARMD'@0220'$  12/02/21 [place]    Staphylococcus lugdunensis NOT DETECTED NOT DETECTED Final   Streptococcus species NOT DETECTED NOT DETECTED Final   Streptococcus agalactiae NOT DETECTED NOT DETECTED Final   Streptococcus pneumoniae NOT DETECTED NOT DETECTED Final   Streptococcus pyogenes NOT DETECTED NOT DETECTED Final   A.calcoaceticus-baumannii NOT DETECTED NOT DETECTED Final   Bacteroides fragilis NOT DETECTED NOT DETECTED Final   Enterobacterales NOT DETECTED NOT DETECTED Final   Enterobacter cloacae complex NOT DETECTED NOT DETECTED Final   Escherichia coli NOT DETECTED NOT DETECTED Final   Klebsiella aerogenes NOT DETECTED NOT DETECTED Final   Klebsiella oxytoca NOT DETECTED NOT DETECTED Final   Klebsiella pneumoniae NOT DETECTED NOT DETECTED Final   Proteus species NOT DETECTED NOT DETECTED Final   Salmonella species NOT DETECTED NOT DETECTED Final   Serratia marcescens NOT DETECTED NOT DETECTED Final   Haemophilus influenzae NOT DETECTED NOT DETECTED Final   Neisseria meningitidis NOT DETECTED NOT DETECTED Final   Pseudomonas aeruginosa NOT DETECTED NOT DETECTED Final   Stenotrophomonas maltophilia NOT DETECTED NOT DETECTED Final   Candida albicans NOT  DETECTED NOT DETECTED Final   Candida auris NOT DETECTED NOT DETECTED Final   Candida glabrata NOT DETECTED NOT DETECTED Final   Candida krusei NOT DETECTED NOT DETECTED Final   Candida parapsilosis NOT DETECTED NOT DETECTED Final   Candida tropicalis NOT DETECTED NOT DETECTED Final   Cryptococcus neoformans/gattii NOT DETECTED NOT DETECTED Final   Methicillin resistance mecA/C NOT DETECTED NOT DETECTED Final    Comment: Performed at [facility], [address] [place], [place] [address]  Blood Culture (routine x 2)     Status: None (Preliminary result)   Collection Time: 12/01/21 12:54 AM   Specimen: BLOOD  Result Value Ref Range Status   Specimen Description   Final    BLOOD BLOOD LEFT HAND Performed at [place] [address], [place], [place] [address]    Special Requests   Final    BOTTLES DRAWN AEROBIC AND ANAEROBIC Blood Culture results may  not be optimal due to an inadequate volume of blood received in culture bottles Performed at [facility] [address] [place], [place] [address]    Culture   Final    NO GROWTH 3 DAYS Performed at [facility], [place] [address] [place], [place] [address]    Report Status PENDING  Incomplete  Time coordinating discharge: 35 minutes  SIGNED: Antonieta Pert, MD  [facility] 12/04/2021, 4:43 PM  If 7PM-7AM, please contact night-coverage [email or web address]

## 2021-12-04 NOTE — Progress Notes (Signed)
The patient started Augmentin. Dose was tolerated well. Pt denies N/V or abdominal discomfort.

## 2021-12-04 NOTE — Progress Notes (Signed)
No change from am assessment. The patient is alert, oriented x4, ambulatory without assistance. Pt was eager to discharge. Discharge instructions were reviewed, questions concerns denied. The pt was encouraged to pick up new medications from [facility].

## 2021-12-06 LAB — CULTURE, BLOOD (ROUTINE X 2): Culture: NO GROWTH

## 2021-12-15 ENCOUNTER — Telehealth: Payer: Self-pay | Admitting: *Deleted

## 2021-12-15 NOTE — Telephone Encounter (Signed)
Returned call from 2:51 PM. Left patient a message of what web site Dr. Gala Romney wanted her to go on for medication.

## 2022-01-20 ENCOUNTER — Other Ambulatory Visit (HOSPITAL_COMMUNITY): Payer: Self-pay

## 2022-02-10 DIAGNOSIS — Z1211 Encounter for screening for malignant neoplasm of colon: Secondary | ICD-10-CM | POA: Diagnosis not present

## 2022-02-10 DIAGNOSIS — Z1212 Encounter for screening for malignant neoplasm of rectum: Secondary | ICD-10-CM | POA: Diagnosis not present

## 2022-02-17 LAB — COLOGUARD: COLOGUARD: NEGATIVE

## 2022-04-12 DIAGNOSIS — I7300 Raynaud's syndrome without gangrene: Secondary | ICD-10-CM | POA: Insufficient documentation

## 2022-04-12 NOTE — Progress Notes (Signed)
COVID Vaccine Completed:  Date of COVID positive in last 90 days:  PCP - Alicia Cardiologist -  Neurologist - Tommas Olp, MD for RLS  Chest x-ray - 12-01-21 Epic EKG - 12-01-21 Epic Stress Test -  ECHO -  Cardiac Cath -  Pacemaker/ICD device last checked: Spinal Cord Stimulator:  Bowel Prep -   Sleep Study -  CPAP -   Fasting Blood Sugar -  Checks Blood Sugar _____ times a day  Last dose of GLP1 agonist-  N/A GLP1 instructions:  N/A   Last dose of SGLT-2 inhibitors-  N/A SGLT-2 instructions: N/A   Blood Thinner Instructions: Aspirin Instructions: Last Dose:  Activity level:  Can go up a flight of stairs and perform activities of daily living without stopping and without symptoms of chest pain or shortness of breath.  Able to exercise without symptoms  Unable to go up a flight of stairs without symptoms of     Anesthesia review:   Patient denies shortness of breath, fever, cough and chest pain at PAT appointment  Patient verbalized understanding of instructions that were given to them at the PAT appointment. Patient was also instructed that they will need to review over the PAT instructions again at home before surgery.

## 2022-04-12 NOTE — Patient Instructions (Signed)
SURGICAL WAITING ROOM VISITATION Patients having surgery or a procedure may have no more than 2 support people in the waiting area - these visitors may rotate.   Children under the age of 57 must have an adult with them who is not the patient. If the patient needs to stay at the hospital during part of their recovery, the visitor guidelines for inpatient rooms apply. Pre-op nurse will coordinate an appropriate time for 1 support person to accompany patient in pre-op.  This support person may not rotate.    Please refer to the [facility] website for the visitor guidelines for Inpatients (after your surgery is over and you are in a regular room).      Your procedure is scheduled on: 04-26-22   Report to [facility] Main Entrance    Report to admitting at 12:45 PM   Call this number if you have problems the morning of surgery [phone]   Do not eat food :After Midnight.   After Midnight you may have the following liquids until 12:00 PM DAY OF SURGERY  Water Non-Citrus Juices (without pulp, NO RED) Carbonated Beverages Black Coffee (NO MILK/CREAM OR CREAMERS, sugar ok)  Clear Tea (NO MILK/CREAM OR CREAMERS, sugar ok) regular and decaf                             Plain Jell-O (NO RED)                                           Fruit ices (not with fruit pulp, NO RED)                                     Popsicles (NO RED)                                                               Sports drinks like Gatorade (NO RED)                   The day of surgery:  Drink ONE (1) Pre-Surgery Clear Ensure at 12:00 PM the morning of surgery. Drink in one sitting. Do not sip.  This drink was given to you during your hospital  pre-op appointment visit. Nothing else to drink after completing the Pre-Surgery Clear Ensure.          If you have questions, please contact your surgeon's office.   FOLLOW  ANY ADDITIONAL PRE OP INSTRUCTIONS YOU RECEIVED FROM YOUR SURGEON'S OFFICE!!!      Oral Hygiene is also important to reduce your risk of infection.                                    Remember - BRUSH YOUR TEETH THE MORNING OF SURGERY WITH YOUR REGULAR TOOTHPASTE   Do NOT smoke after Midnight   Take these medicines the morning of surgery with A SIP OF WATER:   Esomeprazole  Singulair  Ropinirole  NP Thyroid  Tramadol  if needed                              You may not have any metal on your body including hair pins, jewelry, and body piercing             Do not wear make-up, lotions, powders, perfumes or deodorant  Do not wear nail polish including gel and S&S, artificial/acrylic nails, or any other type of covering on natural nails including finger and toenails. If you have artificial nails, gel coating, etc. that needs to be removed by a nail salon please have this removed prior to surgery or surgery may need to be canceled/ delayed if the surgeon/ anesthesia feels like they are unable to be safely monitored.   Do not shave  48 hours prior to surgery.           Do not bring valuables to the hospital. [place].   Contacts, dentures or bridgework may not be worn into surgery.  DO NOT [place]. PHARMACY WILL DISPENSE MEDICATIONS LISTED ON YOUR MEDICATION LIST TO YOU DURING YOUR ADMISSION [place]!    Patients discharged on the day of surgery will not be allowed to drive home.  Someone NEEDS to stay with you for the first 24 hours after anesthesia.   Special Instructions: Bring a copy of your healthcare power of attorney and living will documents the day of surgery if you haven't scanned them before.              Please read over the following fact sheets you were given: IF [place]   If you received a COVID test during your pre-op visit  it is requested that you wear a mask when out in public, stay away from anyone  that may not be feeling well and notify your surgeon if you develop symptoms. If you test positive for Covid or have been in contact with anyone that has tested positive in the last 10 days please notify you surgeon.  [facility] - Preparing for Surgery Before surgery, you can play an important role.  Because skin is not sterile, your skin needs to be as free of germs as possible.  You can reduce the number of germs on your skin by washing with CHG (chlorahexidine gluconate) soap before surgery.  CHG is an antiseptic cleaner which kills germs and bonds with the skin to continue killing germs even after washing. Please DO NOT use if you have an allergy to CHG or antibacterial soaps.  If your skin becomes reddened/irritated stop using the CHG and inform your nurse when you arrive at Short Stay. Do not shave (including legs and underarms) for at least 48 hours prior to the first CHG shower.  You may shave your face/neck.  Please follow these instructions carefully:  1.  Shower with CHG Soap the night before surgery and the  morning of surgery.  2.  If you choose to wash your hair, wash your hair first as usual with your normal  shampoo.  3.  After you shampoo, rinse your hair and body thoroughly to remove the shampoo.                             4.  Use CHG  as you would any other liquid soap.  You can apply chg directly to the skin and wash.  Gently with a scrungie or clean washcloth.  5.  Apply the CHG Soap to your body ONLY FROM THE NECK DOWN.   Do   not use on face/ open                           Wound or open sores. Avoid contact with eyes, ears mouth and   genitals (private parts).                       Wash face,  Genitals (private parts) with your normal soap.             6.  Wash thoroughly, paying special attention to the area where your    surgery  will be performed.  7.  Thoroughly rinse your body with warm water from the neck down.  8.  DO NOT shower/wash with your normal soap after using  and rinsing off the CHG Soap.                9.  Pat yourself dry with a clean towel.            10.  Wear clean pajamas.            11.  Place clean sheets on your bed the night of your first shower and do not  sleep with pets. Day of Surgery : Do not apply any lotions/deodorants the morning of surgery.  Please wear clean clothes to the hospital/surgery center.  FAILURE TO FOLLOW THESE INSTRUCTIONS MAY RESULT IN THE CANCELLATION OF YOUR SURGERY  PATIENT SIGNATURE_________________________________  NURSE SIGNATURE__________________________________  ________________________________________________________________________    Adam Phenix  An incentive spirometer is a tool that can help keep your lungs clear and active. This tool measures how well you are filling your lungs with each breath. Taking long deep breaths may help reverse or decrease the chance of developing breathing (pulmonary) problems (especially infection) following: A long period of time when you are unable to move or be active. BEFORE THE PROCEDURE  If the spirometer includes an indicator to show your best effort, your nurse or respiratory therapist will set it to a desired goal. If possible, sit up straight or lean slightly forward. Try not to slouch. Hold the incentive spirometer in an upright position. INSTRUCTIONS FOR USE  Sit on the edge of your bed if possible, or sit up as far as you can in bed or on a chair. Hold the incentive spirometer in an upright position. Breathe out normally. Place the mouthpiece in your mouth and seal your lips tightly around it. Breathe in slowly and as deeply as possible, raising the piston or the ball toward the top of the column. Hold your breath for 3-5 seconds or for as long as possible. Allow the piston or ball to fall to the bottom of the column. Remove the mouthpiece from your mouth and breathe out normally. Rest for a few seconds and repeat Steps 1 through 7 at least 10  times every 1-2 hours when you are awake. Take your time and take a few normal breaths between deep breaths. The spirometer may include an indicator to show your best effort. Use the indicator as a goal to work toward during each repetition. After each set of 10 deep breaths, practice coughing to be sure your lungs are  clear. If you have an incision (the cut made at the time of surgery), support your incision when coughing by placing a pillow or rolled up towels firmly against it. Once you are able to get out of bed, walk around indoors and cough well. You may stop using the incentive spirometer when instructed by your caregiver.  RISKS AND COMPLICATIONS Take your time so you do not get dizzy or light-headed. If you are in pain, you may need to take or ask for pain medication before doing incentive spirometry. It is harder to take a deep breath if you are having pain. AFTER USE Rest and breathe slowly and easily. It can be helpful to keep track of a log of your progress. Your caregiver can provide you with a simple table to help with this. If you are using the spirometer at home, follow these instructions: [place] IF:  You are having difficultly using the spirometer. You have trouble using the spirometer as often as instructed. Your pain medication is not giving enough relief while using the spirometer. You develop fever of 100.5 F (38.1 C) or higher. SEEK IMMEDIATE MEDICAL CARE IF:  You cough up bloody sputum that had not been present before. You develop fever of 102 F (38.9 C) or greater. You develop worsening pain at or near the incision site. MAKE SURE YOU:  Understand these instructions. Will watch your condition. Will get help right away if you are not doing well or get worse. Document Released: 09/06/2006 Document Revised: 07/19/2011 Document Reviewed: 11/07/2006 [facility] [employer] [facility] Patient Information 2014 [place],  [place].   ________________________________________________________________________

## 2022-04-12 NOTE — Progress Notes (Unsigned)
GYNECOLOGY OFFICE VISIT NOTE  History:   [name] is a 66 y.o. [phone] here today for repap.    2017: Pap wnl, HPV neg 2018: LSIL, HPV pos > Colpo w/CIN1 > LEEP 2019: Normal pap/HPV pos 04/2019: HSIL/HPV Neg > Colpo neg 07/2019: LSIL/HPV neg > possible HSIL? > Colpo: CIN2, cervix flush with vagina 08/2019: LEEP, CIN2 with neg margins 05/2020: Hyst at [facility] 09/2021: LSIL/HPV neg   She uses vagifem for severe atrophic vaginitis. She uses silicone based lubricants.   Past Medical History:  Diagnosis Date   Arthritis    Asthma    Cancer ([place])    skin   Cholecystitis    GERD (gastroesophageal reflux disease)    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   History of hip replacement    Bilateral   Hypertension    Hypothyroidism    Menopause    Pancreatitis    PONV (postoperative nausea and vomiting)    Small bowel obstruction due to adhesions [facility])    Traumatic injury of back     Past Surgical History:  Procedure Laterality Date   ABDOMINAL ADHESION SURGERY  1972   ABDOMINAL HYSTERECTOMY     APPENDECTOMY  1995   AUGMENTATION MAMMAPLASTY     CERVICAL SPINE SURGERY     CESAREAN SECTION  2001   COLONOSCOPY     OOPHORECTOMY  1970's   left   SHOULDER SURGERY     THYROIDECTOMY  2003   TONSILLECTOMY     TOTAL HIP ARTHROPLASTY Bilateral    TOTAL KNEE ARTHROPLASTY Left 09/08/2020   Procedure: TOTAL KNEE ARTHROPLASTY;  Surgeon: Gaynelle Arabian, MD;  Location: [facility];  Service: Orthopedics;  Laterality: Left;   TUBAL LIGATION      The following portions of the patient's history were reviewed and updated as appropriate: allergies, current medications, past family history, past medical history, past social history, past surgical history and problem list.   Health Maintenance:   Diagnosis  Date Value Ref Range Status  09/07/2021 - Low grade squamous intraepithelial lesion (LSIL) (A)  Final   Pap was HPV neg.   Normal mammogram on 05/16/2019.   Review of Systems:  Pertinent items  noted in HPI and remainder of comprehensive ROS otherwise negative.  Physical Exam:  There were no vitals taken for this visit. CONSTITUTIONAL: Well-developed, well-nourished female in no acute distress.  HEENT:  Normocephalic, atraumatic. External right and left ear normal. No scleral icterus.  NECK: Normal range of motion, supple, no masses noted on observation SKIN: No rash noted. Not diaphoretic. No erythema. No pallor. MUSCULOSKELETAL: Normal range of motion. No edema noted. NEUROLOGIC: Alert and oriented to person, place, and time. Normal muscle tone coordination. No cranial nerve deficit noted. PSYCHIATRIC: Normal mood and affect. Normal behavior. Normal judgment and thought content.  PELVIC: Normal appearing external genitalia; normal urethral meatus; normal appearing vaginal mucosa. Performed in the presence of a chaperone  Labs and Imaging No results found for this or any previous visit (from the past 168 hour(s)). No results found.  Assessment and Plan:   1. Abnormal cervical Papanicolaou smear, unspecified abnormal pap finding Pap with HPV of the vagina done     Diagnoses and all orders for this visit:  Abnormal cervical Papanicolaou smear, unspecified abnormal pap finding    Routine preventative health maintenance measures emphasized. Please refer to After Visit Summary for other counseling recommendations.   No follow-ups on file.  Radene Gunning, MD, [place]  for [employer], [place]

## 2022-04-14 ENCOUNTER — Other Ambulatory Visit: Payer: Self-pay

## 2022-04-14 ENCOUNTER — Encounter (HOSPITAL_COMMUNITY): Payer: Self-pay

## 2022-04-14 ENCOUNTER — Encounter (HOSPITAL_COMMUNITY)
Admission: RE | Admit: 2022-04-14 | Discharge: 2022-04-14 | Disposition: A | Discharge status: Home / Self Care | Payer: No Typology Code available for payment source | Source: Ambulatory Visit | Attending: Orthopedic Surgery | Admitting: Orthopedic Surgery

## 2022-04-14 VITALS — BP 152/81 | HR 98 | Temp 98.0°F | Resp 16 | Ht 60.0 in | Wt 156.2 lb

## 2022-04-14 DIAGNOSIS — Z01818 Encounter for other preprocedural examination: Secondary | ICD-10-CM

## 2022-04-14 DIAGNOSIS — Z01812 Encounter for preprocedural laboratory examination: Secondary | ICD-10-CM | POA: Diagnosis not present

## 2022-04-14 DIAGNOSIS — I2510 Atherosclerotic heart disease of native coronary artery without angina pectoris: Secondary | ICD-10-CM | POA: Diagnosis not present

## 2022-04-14 HISTORY — DX: Anxiety disorder, unspecified: F41.9

## 2022-04-14 HISTORY — DX: Malignant neoplasm of cervix uteri, unspecified: C53.9

## 2022-04-14 HISTORY — DX: Depression, unspecified: F32.A

## 2022-04-14 HISTORY — DX: Zoster without complications: B02.9

## 2022-04-14 LAB — CBC
HCT: 43.5 % (ref 36.0–46.0)
Hemoglobin: 14.2 g/dL (ref 12.0–15.0)
MCH: 31.5 pg (ref 26.0–34.0)
MCHC: 32.6 g/dL (ref 30.0–36.0)
MCV: 96.5 fL (ref 80.0–100.0)
Platelets: 269 10*3/uL (ref 150–400)
RBC: 4.51 MIL/uL (ref 3.87–5.11)
RDW: 14.2 % (ref 11.5–15.5)
WBC: 10.2 10*3/uL (ref 4.0–10.5)
nRBC: 0 % (ref 0.0–0.2)

## 2022-04-14 LAB — NO BLOOD PRODUCTS

## 2022-04-14 LAB — BASIC METABOLIC PANEL
Anion gap: 10 (ref 5–15)
BUN: 22 mg/dL (ref 8–23)
CO2: 28 mmol/L (ref 22–32)
Calcium: 8.3 mg/dL — ABNORMAL LOW (ref 8.9–10.3)
Chloride: 102 mmol/L (ref 98–111)
Creatinine, Ser: 0.77 mg/dL (ref 0.44–1.00)
GFR, Estimated: >60 mL/min (ref >60)
Glucose, Bld: 118 mg/dL — ABNORMAL HIGH (ref 70–99)
Potassium: 3.5 mmol/L (ref 3.5–5.1)
Sodium: 140 mmol/L (ref 135–145)

## 2022-04-15 ENCOUNTER — Encounter: Payer: Self-pay | Admitting: Obstetrics and Gynecology

## 2022-04-15 ENCOUNTER — Other Ambulatory Visit (HOSPITAL_COMMUNITY)
Admission: RE | Admit: 2022-04-15 | Discharge: 2022-04-15 | Disposition: A | Discharge status: Home / Self Care | Payer: No Typology Code available for payment source | Source: Ambulatory Visit | Attending: Obstetrics and Gynecology | Admitting: Obstetrics and Gynecology

## 2022-04-15 ENCOUNTER — Ambulatory Visit (INDEPENDENT_AMBULATORY_CARE_PROVIDER_SITE_OTHER): Payer: Medicare (Managed Care) | Admitting: Obstetrics and Gynecology

## 2022-04-15 VITALS — BP 138/88 | HR 113 | Resp 16 | Ht 63.0 in | Wt 157.0 lb

## 2022-04-15 DIAGNOSIS — R8769 Abnormal cytological findings in specimens from other female genital organs: Secondary | ICD-10-CM | POA: Insufficient documentation

## 2022-04-15 DIAGNOSIS — R87619 Unspecified abnormal cytological findings in specimens from cervix uteri: Secondary | ICD-10-CM | POA: Diagnosis not present

## 2022-04-20 LAB — CYTOLOGY - PAP
Comment: NEGATIVE
Diagnosis: UNDETERMINED — AB
High risk HPV: NEGATIVE

## 2022-04-23 NOTE — H&P (Signed)
H&P  Subjective:  HPI: [name], 66 y.o. female presented to clinic for follow-up evaluation of her right hip. She was having painful popping, which has been occurring ever since she had the hip arthroplasty surgery in 2020. She had it done posteriorly by Dr. Jefferson Fuel. She said that it is really limiting her ability to do a lot of walking. I saw her back on 07/30/2021 and there was concern that she had either internal impingement between the femoral neck and an acetabular shell or might have been more of a tendinous type impingement.  Patient Active Problem List   Diagnosis Date Noted   Raynaud's phenomenon 04/12/2022   Sepsis ([place]) 12/01/2021   Diverticulitis of colon 12/01/2021   OA (osteoarthritis) of knee 09/08/2020   Abnormal Pap smear of cervix 11/03/2016   Glenohumeral arthritis, left 06/13/2016   Hypertension 09/24/2013   Asymptomatic postmenopausal status 07/20/2013   Hypothyroid 07/20/2013   Localized osteoarthrosis, hand 08/10/2011    Past Medical History:  Diagnosis Date   Anxiety    Arthritis    Asthma    Cancer ([place])    skin   Cervical cancer ([place])    Cholecystitis    Depression    GERD (gastroesophageal reflux disease)    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   History of hip replacement    Bilateral   Hypertension    Hypothyroidism    Menopause    Pancreatitis    PONV (postoperative nausea and vomiting)    Shingles    Small bowel obstruction due to adhesions ([place])    Traumatic injury of back     Past Surgical History:  Procedure Laterality Date   ABDOMINAL ADHESION SURGERY  1972   ABDOMINAL HYSTERECTOMY     APPENDECTOMY  1995   AUGMENTATION MAMMAPLASTY     CERVICAL SPINE SURGERY     CESAREAN SECTION  2001   COLONOSCOPY     OOPHORECTOMY  1970's   left   SHOULDER SURGERY     THYROIDECTOMY  2003   TONSILLECTOMY     TOTAL HIP ARTHROPLASTY Bilateral    TOTAL KNEE ARTHROPLASTY Left 09/08/2020   Procedure: TOTAL KNEE ARTHROPLASTY;  Surgeon: Gaynelle Arabian,  MD;  Location: [facility];  Service: Orthopedics;  Laterality: Left;   TUBAL LIGATION      Prior to Admission medications   Medication Sig Start Date End Date Taking? Authorizing Provider  albuterol (VENTOLIN HFA) 108 (90 Base) MCG/ACT inhaler Inhale 2 puffs into the lungs as needed for wheezing or shortness of breath.   Yes [provider]  Ascorbic Acid (VITAMIN C) 1000 MG tablet Take 1,000 mg by mouth daily.   Yes [provider]  BIOTIN PO Take 1 tablet by mouth daily.   Yes [provider]  CALCIUM PO Take 2 tablets by mouth daily.   Yes [provider]  Cholecalciferol (VITAMIN D3) 1000 units CAPS Take 1,000 Units by mouth daily.   Yes [provider]  DEVILS CLAW PO Take 1 tablet by mouth daily.   Yes [provider]  DULOXETINE HCL PO Take 2 capsules by mouth daily.   Yes [provider]  estradiol (VIVELLE-DOT) 0.1 MG/24HR patch Place 1 patch (0.1 mg total) onto the skin 2 (two) times a week. Patient taking differently: Place 1 patch onto the skin 2 (two) times a week. Placed Monday and Thursday 07/16/21  Yes Guss Bunde, MD  Estradiol 10 MCG TABS vaginal tablet Place 10 mcg vaginally daily.   Yes  [provider]  fluticasone (FLONASE) 50 MCG/ACT nasal spray Place 2 sprays into both nostrils as needed for allergies or rhinitis.   Yes [provider]  GLUTATHIONE PO Take 1-2 tablets by mouth daily.   Yes [provider]  KRILL OIL PO Take 1 capsule by mouth daily.   Yes [provider]  L-Arginine 1000 MG TABS Take 1,000 mg by mouth daily.   Yes [provider]  levofloxacin (LEVAQUIN) 500 MG tablet Take 500 mg by mouth daily. 04/12/22  Yes [provider]  lisinopril (ZESTRIL) 10 MG tablet Take 10 mg by mouth See admin instructions. Take 10 mg by mouth 2-3 times day   Yes [provider]  MAGNESIUM CITRATE PO Take 1 tablet by mouth daily.   Yes [provider]  montelukast (SINGULAIR) 10 MG tablet Take 10 mg by mouth daily as needed (shortness of breath). 04/23/19  Yes [provider]  Niacin (VITAMIN B-3 PO) Take 1 tablet by mouth daily.   Yes [provider]  NIACINAMIDE PO Take 1 tablet by mouth daily.   Yes [provider]  Thiamine HCl (VITAMIN B-1 PO) Take 1 tablet by mouth daily.   Yes [provider]  thyroid (NP THYROID) 15 MG tablet Take 15 mg by mouth daily.   Yes [provider]  thyroid (NP THYROID) 90 MG tablet Take 90 mg by mouth daily.   Yes [provider]  traMADol (ULTRAM) 50 MG tablet Take 50-100 mg by mouth as needed for moderate pain. 09/04/21  Yes [provider]  Triprolidine-Pseudoephedrine (ANTIHISTAMINE PO) Take 1 tablet by mouth as needed (congestion).   Yes [provider]  valACYclovir (VALTREX) 500 MG tablet Take 500 mg by mouth daily.   Yes [provider]  vitamin B-12 (CYANOCOBALAMIN) 1000 MCG tablet Take 1,000 mcg by mouth daily.   Yes [provider]  rOPINIRole (REQUIP) 1 MG tablet Take 1-2 mg by mouth See admin instructions. 1 mg in the morning, 2 mg in the evening.    [provider]    Allergies  Allergen Reactions   Anesthetics, Ester Nausea And Vomiting   Lidocaine Nausea And Vomiting   Other Cough    Refuse blood or blood products   Oxycodone-Acetaminophen Nausea And Vomiting and Rash   Sulfa Antibiotics Hives   Sulfasalazine Hives and Rash   Amlodipine Swelling   Codeine Hives and Nausea And Vomiting   Hydrocodone Nausea And Vomiting   Lisinopril Itching    Can take split doses-takes 10 in am and 10 in evening   Oxycodone Nausea And Vomiting   Prednisone Swelling and Other (See Comments)    Causes her to have vaginal bleeding. Agitation    Ropinirole Nausea And Vomiting and Other (See Comments)    Gi upset     Social History   Socioeconomic History   Marital status: Legally  Separated    Spouse name: Not on file   Number of children: 3   Years of education: Not on file   Highest education level: Not on file  Occupational History   Occupation: Armed forces technical officer rep    Employer: [employer]  Tobacco Use   Smoking status: Never   Smokeless tobacco: Never  Vaping Use   Vaping Use: Never used  Substance and Sexual Activity   Alcohol use: Yes    Comment: Rare   Drug use: No   Sexual activity: Not Currently    Birth control/protection: None  Other Topics Concern   Not on file  Social History Narrative   Not on file   Social Determinants of Health   Financial Resource Strain: Not on file  Food Insecurity: Not on file  Transportation Needs: Not on file  Physical Activity: Not on file  Stress: Not on file  Social Connections: Not on file  Intimate Partner Violence: Not on file    Tobacco Use: Low Risk  (04/15/2022)   Patient History    Smoking Tobacco Use: Never    Smokeless Tobacco Use: Never    Passive Exposure: Not on file   Social History   Substance and Sexual Activity  Alcohol Use Yes   Comment: Rare    Family History  Problem Relation Age of Onset   Lung cancer Father    Colitis Father    Alcohol abuse Father    Stroke Mother    Colon cancer Neg Hx    Breast cancer Neg Hx     Review of Systems  Constitutional:  Negative for chills and fever.  HENT:  Negative for congestion, sore throat and tinnitus.   Eyes:  Negative for double vision, photophobia and pain.  Respiratory:  Negative for cough, shortness of breath and wheezing.   Cardiovascular:  Negative for chest pain, palpitations and orthopnea.  Gastrointestinal:  Negative for heartburn, nausea and vomiting.  Genitourinary:  Negative for dysuria, frequency and urgency.  Musculoskeletal:  Positive for joint pain.  Neurological:  Negative for dizziness, weakness and headaches.    Objective:  Physical Exam:  The patient is a pleasant, well-developed female alert  and oriented in no apparent distress.  Right Hip Exam: The range of motion: Flexion to 110 degrees, Internal Rotation to 20 degrees, External Rotation to 30 degrees, and abduction to 30 degrees without discomfort. There is positive tenderness just below the greater trochanter  The patient's sensation and motor function are intact in their lower extremities. Their distal pulses are 2+. The bilateral calves are soft and non-tender.  Imaging Review MARS MRI of the right hip demonstrated no fluid within the joint  Assessment/Plan:  ASSESSMENT Right hip pain.      PLAN We discussed the patient's presenting complaints, history, and treatment options. We had previously discussed doing a bursectomy and resecting that area of the IT band where the thickened tissue is that is where she is most painful. I think that is what is catching on the greater trochanter and just below the greater trochanter. It is also possible that this is coming from the position of her acetabular component. When she had a MARS MRI, it showed no fluid in the joint, so it was not thought that this was an impingement problem internally. This was all discussed between Dr. Wynelle Link and the patient and she elects to proceed.  Theresa Duty, PA-C Orthopedic Surgery [facility]

## 2022-04-25 NOTE — Anesthesia Preprocedure Evaluation (Signed)
Anesthesia Evaluation  Patient identified by MRN, date of birth, ID band Patient awake    Reviewed: Allergy & Precautions, NPO status , Patient's Chart, lab work & pertinent test results  History of Anesthesia Complications (+) PONV and history of anesthetic complications  Airway Mallampati: II  TM Distance: >3 FB Neck ROM: Full    Dental no notable dental hx. (+) Teeth Intact, Dental Advisory Given   Pulmonary asthma    Pulmonary exam normal breath sounds clear to auscultation       Cardiovascular hypertension, Pt. on medications Normal cardiovascular exam Rhythm:Regular Rate:Normal     Neuro/Psych   Anxiety     negative neurological ROS  negative psych ROS   GI/Hepatic Neg liver ROS,,,  Endo/Other  Hypothyroidism    Renal/GU Lab Results      Component                Value               Date                      CREATININE               0.77                04/14/2022                 K                        3.5                 04/14/2022                 negative genitourinary   Musculoskeletal  (+) Arthritis ,    Abdominal   Peds negative pediatric ROS (+)  Hematology negative hematology ROS (+) Lab Results      Component                Value               Date                            HGB                      14.2                04/14/2022                HCT                      43.5                04/14/2022                PLT                      269                 04/14/2022              Anesthesia Other Findings All: See list  Reproductive/Obstetrics negative OB ROS                              Anesthesia Physical Anesthesia Plan  ASA: 3  Anesthesia Plan: General   Post-op Pain Management:  Regional for Post-op pain and Minimal or no pain anticipated   Induction: Intravenous  PONV Risk Score and Plan: 3 and Ondansetron, Dexamethasone, Propofol infusion, Treatment  may vary due to age or medical condition, TIVA and Scopolamine patch - Pre-op  Airway Management Planned: LMA and Oral ETT  Additional Equipment: None  Intra-op Plan:   Post-operative Plan: Extubation in OR  Informed Consent: I have reviewed the patients History and Physical, chart, labs and discussed the procedure including the risks, benefits and alternatives for the proposed anesthesia with the patient or authorized representative who has indicated his/her understanding and acceptance.     Dental advisory given  Plan Discussed with: CRNA, Surgeon and Anesthesiologist  Anesthesia Plan Comments: (GA TIVA)         Anesthesia Quick Evaluation

## 2022-04-26 ENCOUNTER — Encounter (HOSPITAL_COMMUNITY)
Admission: RE | Disposition: A | Discharge status: Home / Self Care | Payer: Self-pay | Source: Ambulatory Visit | Attending: Orthopedic Surgery

## 2022-04-26 ENCOUNTER — Ambulatory Visit (HOSPITAL_COMMUNITY)
Admission: RE | Admit: 2022-04-26 | Discharge: 2022-04-26 | Disposition: A | Discharge status: Home / Self Care | Payer: No Typology Code available for payment source | Source: Ambulatory Visit | Attending: Orthopedic Surgery | Admitting: Orthopedic Surgery

## 2022-04-26 ENCOUNTER — Other Ambulatory Visit: Payer: Self-pay

## 2022-04-26 ENCOUNTER — Ambulatory Visit (HOSPITAL_COMMUNITY): Payer: No Typology Code available for payment source | Admitting: Physician Assistant

## 2022-04-26 ENCOUNTER — Telehealth: Payer: Self-pay | Admitting: *Deleted

## 2022-04-26 ENCOUNTER — Encounter (HOSPITAL_COMMUNITY): Payer: Self-pay | Admitting: Orthopedic Surgery

## 2022-04-26 ENCOUNTER — Ambulatory Visit (HOSPITAL_BASED_OUTPATIENT_CLINIC_OR_DEPARTMENT_OTHER): Payer: No Typology Code available for payment source | Admitting: Anesthesiology

## 2022-04-26 DIAGNOSIS — I7300 Raynaud's syndrome without gangrene: Secondary | ICD-10-CM | POA: Insufficient documentation

## 2022-04-26 DIAGNOSIS — E039 Hypothyroidism, unspecified: Secondary | ICD-10-CM | POA: Insufficient documentation

## 2022-04-26 DIAGNOSIS — M719 Bursopathy, unspecified: Secondary | ICD-10-CM | POA: Diagnosis present

## 2022-04-26 DIAGNOSIS — M25851 Other specified joint disorders, right hip: Secondary | ICD-10-CM | POA: Insufficient documentation

## 2022-04-26 DIAGNOSIS — F419 Anxiety disorder, unspecified: Secondary | ICD-10-CM | POA: Insufficient documentation

## 2022-04-26 DIAGNOSIS — M7631 Iliotibial band syndrome, right leg: Secondary | ICD-10-CM | POA: Insufficient documentation

## 2022-04-26 DIAGNOSIS — M7071 Other bursitis of hip, right hip: Secondary | ICD-10-CM

## 2022-04-26 DIAGNOSIS — M1990 Unspecified osteoarthritis, unspecified site: Secondary | ICD-10-CM | POA: Insufficient documentation

## 2022-04-26 DIAGNOSIS — J45909 Unspecified asthma, uncomplicated: Secondary | ICD-10-CM

## 2022-04-26 DIAGNOSIS — I10 Essential (primary) hypertension: Secondary | ICD-10-CM | POA: Insufficient documentation

## 2022-04-26 DIAGNOSIS — M7061 Trochanteric bursitis, right hip: Secondary | ICD-10-CM | POA: Diagnosis present

## 2022-04-26 HISTORY — PX: EXCISION/RELEASE BURSA HIP: SHX5014

## 2022-04-26 SURGERY — RELEASE, BURSA, TROCHANTERIC
Anesthesia: General | Site: Hip | Laterality: Right

## 2022-04-26 MED ORDER — DEXAMETHASONE SODIUM PHOSPHATE 10 MG/ML IJ SOLN
INTRAMUSCULAR | Status: DC | PRN
  Administered 2022-04-26: 10 mg

## 2022-04-26 MED ORDER — 0.9 % SODIUM CHLORIDE (POUR BTL) OPTIME
TOPICAL | Status: DC | PRN
  Administered 2022-04-26: 1000 mL

## 2022-04-26 MED ORDER — ROCURONIUM BROMIDE 10 MG/ML (PF) SYRINGE
PREFILLED_SYRINGE | INTRAVENOUS | Status: AC
  Filled 2022-04-26: qty 10

## 2022-04-26 MED ORDER — METHOCARBAMOL 500 MG PO TABS: 500.0000 mg | ORAL_TABLET | Freq: Four times a day (QID) | ORAL | 0 refills | Status: DC | PRN

## 2022-04-26 MED ORDER — DEXMEDETOMIDINE HCL IN NACL 80 MCG/20ML IV SOLN
INTRAVENOUS | Status: AC
  Filled 2022-04-26: qty 20

## 2022-04-26 MED ORDER — ACETAMINOPHEN 10 MG/ML IV SOLN: 1000.0000 mg | Freq: Once | INTRAVENOUS | Status: DC | PRN

## 2022-04-26 MED ORDER — PROPOFOL 1000 MG/100ML IV EMUL
INTRAVENOUS | Status: AC
  Filled 2022-04-26: qty 100

## 2022-04-26 MED ORDER — PROPOFOL 500 MG/50ML IV EMUL
INTRAVENOUS | Status: DC | PRN
  Administered 2022-04-26: 150 ug/kg/min via INTRAVENOUS

## 2022-04-26 MED ORDER — ACETAMINOPHEN 10 MG/ML IV SOLN
1000.0000 mg | Freq: Four times a day (QID) | INTRAVENOUS | Status: DC
  Administered 2022-04-26: 1000 mg via INTRAVENOUS
  Filled 2022-04-26: qty 100

## 2022-04-26 MED ORDER — TRAMADOL HCL 50 MG PO TABS: 50.0000 mg | ORAL_TABLET | Freq: Four times a day (QID) | ORAL | 0 refills | Status: DC | PRN

## 2022-04-26 MED ORDER — ONDANSETRON HCL 4 MG/2ML IJ SOLN: 4.0000 mg | Freq: Once | INTRAMUSCULAR | Status: DC | PRN

## 2022-04-26 MED ORDER — ASPIRIN 81 MG PO TBEC: 81.0000 mg | DELAYED_RELEASE_TABLET | Freq: Every day | ORAL | 0 refills | Status: AC

## 2022-04-26 MED ORDER — ONDANSETRON HCL 4 MG/2ML IJ SOLN
INTRAMUSCULAR | Status: DC | PRN
  Administered 2022-04-26: 4 mg via INTRAVENOUS

## 2022-04-26 MED ORDER — PROPOFOL 10 MG/ML IV BOLUS
INTRAVENOUS | Status: DC | PRN
  Administered 2022-04-26: 160 mg via INTRAVENOUS

## 2022-04-26 MED ORDER — DEXMEDETOMIDINE HCL IN NACL 80 MCG/20ML IV SOLN
INTRAVENOUS | Status: DC | PRN
  Administered 2022-04-26: 8 ug via BUCCAL

## 2022-04-26 MED ORDER — PHENYLEPHRINE 80 MCG/ML (10ML) SYRINGE FOR IV PUSH (FOR BLOOD PRESSURE SUPPORT)
PREFILLED_SYRINGE | INTRAVENOUS | Status: AC
  Filled 2022-04-26: qty 10

## 2022-04-26 MED ORDER — LACTATED RINGERS IV SOLN: INTRAVENOUS | Status: DC

## 2022-04-26 MED ORDER — FENTANYL CITRATE PF 50 MCG/ML IJ SOSY
25.0000 ug | PREFILLED_SYRINGE | INTRAMUSCULAR | Status: DC | PRN
  Administered 2022-04-26 (×2): 50 ug via INTRAVENOUS

## 2022-04-26 MED ORDER — PHENYLEPHRINE 80 MCG/ML (10ML) SYRINGE FOR IV PUSH (FOR BLOOD PRESSURE SUPPORT)
PREFILLED_SYRINGE | INTRAVENOUS | Status: DC | PRN
  Administered 2022-04-26: 160 ug via INTRAVENOUS

## 2022-04-26 MED ORDER — CHLORHEXIDINE GLUCONATE 0.12 % MT SOLN
15.0000 mL | Freq: Once | OROMUCOSAL | Status: AC
  Administered 2022-04-26: 15 mL via OROMUCOSAL

## 2022-04-26 MED ORDER — CEFAZOLIN SODIUM-DEXTROSE 2-4 GM/100ML-% IV SOLN
2.0000 g | INTRAVENOUS | Status: AC
  Administered 2022-04-26: 2 g via INTRAVENOUS
  Filled 2022-04-26: qty 100

## 2022-04-26 MED ORDER — LIDOCAINE 2% (20 MG/ML) 5 ML SYRINGE
INTRAMUSCULAR | Status: DC | PRN
  Administered 2022-04-26: 100 mg via INTRAVENOUS

## 2022-04-26 MED ORDER — BUPIVACAINE LIPOSOME 1.3 % IJ SUSP
INTRAMUSCULAR | Status: AC
  Filled 2022-04-26: qty 20

## 2022-04-26 MED ORDER — MIDAZOLAM HCL 2 MG/2ML IJ SOLN
INTRAMUSCULAR | Status: AC
  Filled 2022-04-26: qty 2

## 2022-04-26 MED ORDER — ROCURONIUM BROMIDE 10 MG/ML (PF) SYRINGE
PREFILLED_SYRINGE | INTRAVENOUS | Status: DC | PRN
  Administered 2022-04-26: 50 mg via INTRAVENOUS

## 2022-04-26 MED ORDER — SODIUM CHLORIDE (PF) 0.9 % IJ SOLN
INTRAMUSCULAR | Status: AC
  Filled 2022-04-26: qty 30

## 2022-04-26 MED ORDER — PROPOFOL 10 MG/ML IV BOLUS
INTRAVENOUS | Status: AC
  Filled 2022-04-26: qty 20

## 2022-04-26 MED ORDER — ORAL CARE MOUTH RINSE: 15.0000 mL | Freq: Once | OROMUCOSAL | Status: AC

## 2022-04-26 MED ORDER — SUGAMMADEX SODIUM 200 MG/2ML IV SOLN
INTRAVENOUS | Status: DC | PRN
  Administered 2022-04-26: 200 mg via INTRAVENOUS

## 2022-04-26 MED ORDER — LACTATED RINGERS IV SOLN
INTRAVENOUS | Status: DC
Start: 2022-04-26 — End: 2022-04-26

## 2022-04-26 MED ORDER — MIDAZOLAM HCL 2 MG/2ML IJ SOLN
INTRAMUSCULAR | Status: DC | PRN
  Administered 2022-04-26: 2 mg via INTRAVENOUS

## 2022-04-26 MED ORDER — DEXAMETHASONE SODIUM PHOSPHATE 10 MG/ML IJ SOLN: 8.0000 mg | Freq: Once | INTRAMUSCULAR | Status: DC

## 2022-04-26 MED ORDER — FENTANYL CITRATE PF 50 MCG/ML IJ SOSY
PREFILLED_SYRINGE | INTRAMUSCULAR | Status: AC
  Administered 2022-04-26: 50 ug via INTRAVENOUS
  Filled 2022-04-26: qty 3

## 2022-04-26 MED ORDER — FENTANYL CITRATE (PF) 250 MCG/5ML IJ SOLN
INTRAMUSCULAR | Status: DC | PRN
  Administered 2022-04-26 (×2): 50 ug via INTRAVENOUS

## 2022-04-26 MED ORDER — HYDROMORPHONE HCL 2 MG PO TABS: 2.0000 mg | ORAL_TABLET | Freq: Four times a day (QID) | ORAL | 0 refills | Status: DC | PRN

## 2022-04-26 SURGICAL SUPPLY — 48 items
BAG COUNTER SPONGE SURGICOUNT (BAG) IMPLANT
BAG ZIPLOCK 12X15 (MISCELLANEOUS) IMPLANT
BIT DRILL 2.4X128 (BIT) IMPLANT
BLADE EXTENDED COATED 6.5IN (ELECTRODE) ×1 IMPLANT
CLSR STERI-STRIP ANTIMIC 1/2X4 (GAUZE/BANDAGES/DRESSINGS) IMPLANT
COVER SURGICAL LIGHT HANDLE (MISCELLANEOUS) ×1 IMPLANT
DRAPE INCISE IOBAN 66X45 STRL (DRAPES) ×1 IMPLANT
DRAPE ORTHO SPLIT 77X108 STRL (DRAPES) ×2
DRAPE POUCH INSTRU U-SHP 10X18 (DRAPES) ×1 IMPLANT
DRAPE SURG ORHT 6 SPLT 77X108 (DRAPES) ×2 IMPLANT
DRAPE U-SHAPE 47X51 STRL (DRAPES) ×1 IMPLANT
DRESSING MEPILEX FLEX 4X4 (GAUZE/BANDAGES/DRESSINGS) IMPLANT
DRSG ADAPTIC 3X8 NADH LF (GAUZE/BANDAGES/DRESSINGS) ×1 IMPLANT
DRSG AQUACEL AG ADV 3.5X 6 (GAUZE/BANDAGES/DRESSINGS) IMPLANT
DRSG MEPILEX FLEX 4X4 (GAUZE/BANDAGES/DRESSINGS)
DRSG MEPILEX POST OP 4X8 (GAUZE/BANDAGES/DRESSINGS) ×1 IMPLANT
DURAPREP 26ML APPLICATOR (WOUND CARE) ×1 IMPLANT
ELECT REM PT RETURN 15FT ADLT (MISCELLANEOUS) ×1 IMPLANT
GAUZE SPONGE 4X4 12PLY STRL (GAUZE/BANDAGES/DRESSINGS) ×1 IMPLANT
GLOVE BIO SURGEON STRL SZ7.5 (GLOVE) ×1 IMPLANT
GLOVE BIO SURGEON STRL SZ8 (GLOVE) ×2 IMPLANT
GLOVE BIOGEL PI IND STRL 7.0 (GLOVE) ×1 IMPLANT
GLOVE BIOGEL PI IND STRL 7.5 (GLOVE) ×1 IMPLANT
GLOVE BIOGEL PI IND STRL 8 (GLOVE) ×2 IMPLANT
GLOVE BIOGEL PI IND STRL 8.5 (GLOVE) ×1 IMPLANT
GLOVE SURG SS PI 7.0 STRL IVOR (GLOVE) ×1 IMPLANT
GOWN STRL REUS W/ TWL LRG LVL3 (GOWN DISPOSABLE) ×3 IMPLANT
GOWN STRL REUS W/TWL LRG LVL3 (GOWN DISPOSABLE) ×3
KIT BASIN OR (CUSTOM PROCEDURE TRAY) ×1 IMPLANT
KIT TURNOVER KIT A (KITS) IMPLANT
MANIFOLD NEPTUNE II (INSTRUMENTS) ×1 IMPLANT
NDL MA TROC 1/2 (NEEDLE) ×1 IMPLANT
NDL SAFETY ECLIP 18X1.5 (MISCELLANEOUS) ×2 IMPLANT
NEEDLE MA TROC 1/2 (NEEDLE) IMPLANT
NS IRRIG 1000ML POUR BTL (IV SOLUTION) ×1 IMPLANT
PACK TOTAL JOINT (CUSTOM PROCEDURE TRAY) ×1 IMPLANT
PASSER SUT SWANSON 36MM LOOP (INSTRUMENTS) IMPLANT
PROTECTOR NERVE ULNAR (MISCELLANEOUS) ×1 IMPLANT
STRIP CLOSURE SKIN 1/2X4 (GAUZE/BANDAGES/DRESSINGS) ×1 IMPLANT
SUT ETHIBOND NAB CT1 #1 30IN (SUTURE) IMPLANT
SUT MNCRL AB 4-0 PS2 18 (SUTURE) ×1 IMPLANT
SUT STRATAFIX 0 PDS 27 VIOLET (SUTURE) ×1
SUT VIC AB 2-0 CT1 27 (SUTURE) ×2
SUT VIC AB 2-0 CT1 TAPERPNT 27 (SUTURE) ×2 IMPLANT
SUTURE STRATFX 0 PDS 27 VIOLET (SUTURE) IMPLANT
SYR 20ML LL LF (SYRINGE) ×1 IMPLANT
SYR 50ML LL SCALE MARK (SYRINGE) ×1 IMPLANT
TOWEL OR 17X26 10 PK STRL BLUE (TOWEL DISPOSABLE) ×1 IMPLANT

## 2022-04-26 NOTE — Discharge Instructions (Signed)
Dr. Gaynelle Arabian [facility] Specialist [facility] [address], [place], [place] [address] [phone]  Bursectomy / IT Band Repair Discharge Instructions  HOME CARE INSTRUCTIONS  Remove items at home which could result in a fall. This includes throw rugs or furniture in walking pathways.  ICE to the affected hip every three hours for 30 minutes at a time and then as needed for pain and swelling.  Continue to use ice on the hip for pain and swelling from surgery. You may notice swelling that will progress down to the foot and ankle.  This is normal after surgery.  Elevate the leg when you are not up walking on it.   Continue to use the breathing machine which will help keep your temperature down.  It is common for your temperature to cycle up and down following surgery, especially at night when you are not up moving around and exerting yourself.  The breathing machine keeps your lungs expanded and your temperature down. Sit on high chairs which makes it easier to stand.  Sit on chairs with arms. Use the chair arms to help push yourself up when arising.  No active abduction of the leg (no pulling leg out to the side away from the body). Do not lead with the operative leg going up stairs. Use your walker for first several days until comfortable ambulating.  BLOOD CLOT PREVENTION Take an 81 mg Aspirin once a day for three weeks following surgery. Then discontinue Aspirin. You may resume your vitamins/supplements upon discharge from the hospital.  DIET You may resume your previous home diet once your are discharged from the hospital.  DRESSING / WOUND CARE / SHOWERING Keep the surgical dressing until follow up.  The dressing is water proof, so you can shower without any extra covering.  IF THE DRESSING FALLS OFF or the wound gets wet inside, change the dressing with sterile gauze.  Please use good hand washing techniques before changing the dressing.  Do not use any  lotions or creams on the incision until instructed by your surgeon.   You may start showering three days following surgery but do not submerge the incision under water.  ACTIVITY Walk with your walker as instructed. Use walker as long as suggested by your caregivers. Avoid periods of inactivity such as sitting longer than an hour when not asleep. This helps prevent blood clots.  You may resume a sexual relationship in one month or when given the OK by your doctor.  You may return to work once you are cleared by your doctor.  Do not drive a car for 6 weeks or until released by your surgeon.  Do not drive while taking narcotics.  WEIGHT BEARING Weight bearing as tolerated with assist device (walker, cane, etc) as directed, use it as long as suggested by your surgeon or therapist, typically at least 4-6 weeks.  POSTOPERATIVE CONSTIPATION PROTOCOL Constipation - defined medically as fewer than three stools per week and severe constipation as less than one stool per week.  One of the most common issues patients have following surgery is constipation.  Even if you have a regular bowel pattern at home, your normal regimen is likely to be disrupted due to multiple reasons following surgery.  Combination of anesthesia, postoperative narcotics, change in appetite and fluid intake all can affect your bowels.  In order to avoid complications following surgery, here are some recommendations in order to help you during your recovery period.  Colace (docusate) -  Pick up an over-the-counter form of Colace or another stool softener and take twice a day as long as you are requiring postoperative pain medications.  Take with a full glass of water daily.  If you experience loose stools or diarrhea, hold the colace until you stool forms back up.  If your symptoms do not get better within 1 week or if they get worse, check with your doctor.  Dulcolax (bisacodyl) - Pick up over-the-counter and take as directed by the  product packaging as needed to assist with the movement of your bowels.  Take with a full glass of water.  Use this product as needed if not relieved by Colace only.   MiraLax (polyethylene glycol) - Pick up over-the-counter to have on hand.  MiraLax is a solution that will increase the amount of water in your bowels to assist with bowel movements.  Take as directed and can mix with a glass of water, juice, soda, coffee, or tea.  Take if you go more than two days without a movement. Do not use MiraLax more than once per day. Call your doctor if you are still constipated or irregular after using this medication for 7 days in a row.  If you continue to have problems with postoperative constipation, please contact the office for further assistance and recommendations.  If you experience "the worst abdominal pain ever" or develop nausea or vomiting, please contact the office immediatly for further recommendations for treatment.  ITCHING If you experience itching with your medications, try taking only a single pain pill, or even half a pain pill at a time.  You can also use Benadryl over the counter for itching or also to help with sleep.   TED HOSE STOCKINGS Wear the elastic stockings on both legs for three weeks following surgery during the day but you may remove them at night for sleeping.  MEDICATIONS See your medication summary on the "After Visit Summary" that the nursing staff will review with you prior to discharge.  You may have some home medications which will be placed on hold until you complete the course of blood thinner medication.  It is important for you to complete the blood thinner medication as prescribed by your surgeon.  Continue your approved medications as instructed at time of discharge.  PRECAUTIONS If you experience chest pain or shortness of breath - call 911 immediately for transfer to the hospital emergency department.  If you develop a fever greater that 101 F, purulent  drainage from wound, increased redness or drainage from wound, foul odor from the wound/dressing, or calf pain - CONTACT YOUR SURGEON.                                                   FOLLOW-UP APPOINTMENTS Make sure you keep all of your appointments after your operation with your surgeon and caregivers. You should call the office at the above phone number and make an appointment for approximately two weeks after the date of your surgery or on the date instructed by your surgeon outlined in the "After Visit Summary".  Pick up stool softner and laxative for home use following surgery while on pain medications. Do not submerge incision under water. May shower starting three days after surgery. Please use a clean towel to pat the incision dry following showers. Continue to use ice  for pain and swelling after surgery. Do not use any lotions or creams on the incision until instructed by your surgeon.

## 2022-04-26 NOTE — Anesthesia Postprocedure Evaluation (Signed)
Anesthesia Post Note  Patient: [name]  Procedure(s) Performed: Right hip bursectomy; iliotibial band repair vs partial resection (Right: Hip)     Patient location during evaluation: PACU Anesthesia Type: General Level of consciousness: awake and alert Pain management: pain level controlled Vital Signs Assessment: post-procedure vital signs reviewed and stable Respiratory status: spontaneous breathing, nonlabored ventilation, respiratory function stable and patient connected to nasal cannula oxygen Cardiovascular status: blood pressure returned to baseline and stable Postop Assessment: no apparent nausea or vomiting Anesthetic complications: no  No notable events documented.  Last Vitals:  Vitals:   04/26/22 1730 04/26/22 1745  BP: 122/81 96/68  Pulse: 100 94  Resp: 15 15  Temp:    SpO2: 93% 95%    Last Pain:  Vitals:   04/26/22 1730  TempSrc:   PainSc: 3                  Barnet Glasgow

## 2022-04-26 NOTE — Telephone Encounter (Signed)
Pt notified that her pap showed ASCUS but HPV is neg.  She will need a repeat pap in 6 months.  She is to continue with the vaginal estrogen.

## 2022-04-26 NOTE — Telephone Encounter (Signed)
-----   Message from Radene Gunning, MD sent at 04/20/2022  5:11 PM EST ----- She does not use MyChart. Please let her know pap is ASCUS/HPV Negative. This is considered a NORMAL pap smear. I would recommend a pap smear with HPV testing in 6 months with Dr. Gala Romney. If she gets the same result again, we could go to annual or even less for paps. It appears the vaginal estrogen did help so that is good news.  Thanks, Dr. Damita Dunnings

## 2022-04-26 NOTE — Anesthesia Procedure Notes (Signed)
Procedure Name: Intubation Date/Time: 04/26/2022 3:29 PM  Performed by: Jenne Campus, CRNAPre-anesthesia Checklist: Patient identified, Emergency Drugs available, Suction available and Patient being monitored Patient Re-evaluated:Patient Re-evaluated prior to induction Oxygen Delivery Method: Circle System Utilized Preoxygenation: Pre-oxygenation with 100% oxygen Induction Type: IV induction Ventilation: Mask ventilation without difficulty Laryngoscope Size: Miller and 2 Grade View: Grade I Tube type: Oral Tube size: 7.0 mm Number of attempts: 1 Airway Equipment and Method: Stylet and Oral airway Placement Confirmation: ETT inserted through vocal cords under direct vision, positive ETCO2 and breath sounds checked- equal and bilateral Secured at: 21 cm Tube secured with: Tape Dental Injury: Teeth and Oropharynx as per pre-operative assessment

## 2022-04-26 NOTE — Transfer of Care (Signed)
Immediate Anesthesia Transfer of Care Note  Patient: [name]  Procedure(s) Performed: Right hip bursectomy; iliotibial band repair vs partial resection (Right: Hip)  Patient Location: PACU  Anesthesia Type:General  Level of Consciousness: awake, oriented, and patient cooperative  Airway & Oxygen Therapy: Patient Spontanous Breathing and Patient connected to face mask oxygen  Post-op Assessment: Report given to RN and Post -op Vital signs reviewed and stable  Post vital signs: Reviewed  Last Vitals:  Vitals Value Taken Time  BP 108/71 04/26/22 1630  Temp    Pulse 96 04/26/22 1631  Resp 20 04/26/22 1631  SpO2 98 % 04/26/22 1631  Vitals shown include unvalidated device data.  Last Pain:  Vitals:   04/26/22 1304  TempSrc:   PainSc: 0-No pain         Complications: No notable events documented.

## 2022-04-26 NOTE — Interval H&P Note (Signed)
History and Physical Interval Note:  04/26/2022 12:49 PM  [name]  has presented today for surgery, with the diagnosis of Right hip bursitis; iliotibial band tear and impingement.  The various methods of treatment have been discussed with the patient and family. After consideration of risks, benefits and other options for treatment, the patient has consented to  Procedure(s) with comments: Right hip bursectomy; iliotibial band repair vs partial resection (Right) - 27mn as a surgical intervention.  The patient's history has been reviewed, patient examined, no change in status, stable for surgery.  I have reviewed the patient's chart and labs.  Questions were answered to the patient's satisfaction.     FPilar PlateAluisio

## 2022-04-26 NOTE — Brief Op Note (Signed)
04/26/2022  4:01 PM  PATIENT:  [name]  66 y.o. female  PRE-OPERATIVE DIAGNOSIS:  Right hip bursitis; iliotibial band tear and impingement  POST-OPERATIVE DIAGNOSIS:  Right hip bursitis; iliotibial band tear and impingement  PROCEDURE:  Procedure(s) with comments: Right hip bursectomy; iliotibial band repair vs partial resection (Right) - 42mn  SURGEON:  Surgeon(s) and Role:    *Gaynelle Arabian MD - Primary  PHYSICIAN ASSISTANT:   ASSISTANTS: AJaynie Bream PA-C   ANESTHESIA:   general  EBL:  3 mL   BLOOD ADMINISTERED:none  DRAINS: none   LOCAL MEDICATIONS USED:  NONE  COUNTS:  YES  TOURNIQUET:  * No tourniquets in log *  DICTATION: .Other Dictation: Dictation Number [id number] PLAN OF CARE: Discharge to home after PACU  PATIENT DISPOSITION:  PACU - hemodynamically stable.

## 2022-04-27 ENCOUNTER — Encounter (HOSPITAL_COMMUNITY): Payer: Self-pay | Admitting: Orthopedic Surgery

## 2022-04-27 NOTE — Op Note (Signed)
NAMESAMREEN, SELTZER MEDICAL RECORD NO: [id number] ACCOUNT NO: [id number] DATE OF BIRTH: 1956-04-13 FACILITY: [facility] LOCATION: [facility] PHYSICIAN: Dione Plover. Sathvika Ojo, MD  Operative Report   DATE OF PROCEDURE: 04/26/2022  PREOPERATIVE DIAGNOSIS:  Right hip bursitis, iliotibial band tear and impingement.  POSTOPERATIVE DIAGNOSIS:  Right hip bursitis, iliotibial band tear and impingement.  PROCEDURE:  Right hip bursectomy, iliotibial band repair.  SURGEON:  Dione Plover. Isaly Fasching, MD  ASSISTANT:  Jaynie Bream, PA-C.  ANESTHESIA:  General.  ESTIMATED BLOOD LOSS:  5 mL  DRAINS:  None.  COMPLICATIONS:  None.  CONDITION:  Stable to recovery.  BRIEF CLINICAL NOTE:  The patient is a 66 year old female, previous history of right total hip arthroplasty.  She has had significant right lateral hip pain and a catching sensation in the hip.  I felt that might have been an internal impingement in the  acetabulum, but [facility] MRI showed no fluid collection.  She has had worsening pain and catching on the outer part of the hip.  Her MRI did show thickening of the IT band with a small split in the band.  She presents now for bursectomy, IT band resection  and repair.  DESCRIPTION OF PROCEDURE:  After successful administration of general anesthetic, the patient was placed in left lateral decubitus position with the right side up and held with a hip positioner.  Right lower extremity was isolated from perineum with  plastic drapes and prepped and draped in the usual sterile fashion.  A lateral based incision was placed centered at the tip of the greater trochanter about 4 inches in length altogether.  I can feel the split in the IT band just below the skin.  Skin  was cut with a 10 blade through subcutaneous tissue to the fascia lata.  That split is palpable.  We incised the IT band down to the split.  This was significantly thickened and calcified bursa present and that is excised with electrocautery.  This  was  taken off the greater trochanter.  Meticulous hemostasis was achieved.  The area where the split in the IT band was, was very thick.  I debrided this back with a knife to get it to a more normal contour.  We then thoroughly irrigated the hip with normal  saline.  I was able to repair the IT band with a running 0 Stratafix suture.  This was a very stable repair.  The subcutaneous was then closed with interrupted 2-0 Vicryl and subcuticular running 4-0 Monocryl.  Incisions cleaned and dried and  Steri-Strips and a sterile dressing applied.  She was awakened and transported to recovery in stable condition.   PUS D: 04/26/2022 4:05:14 pm T: 04/27/2022 12:06:00 am  JOB: [id number]/ [id number]

## 2022-05-08 ENCOUNTER — Encounter (HOSPITAL_BASED_OUTPATIENT_CLINIC_OR_DEPARTMENT_OTHER): Payer: Self-pay | Admitting: Emergency Medicine

## 2022-05-08 ENCOUNTER — Other Ambulatory Visit: Payer: Self-pay

## 2022-05-08 ENCOUNTER — Emergency Department (HOSPITAL_BASED_OUTPATIENT_CLINIC_OR_DEPARTMENT_OTHER)
Admission: EM | Admit: 2022-05-08 | Discharge: 2022-05-08 | Disposition: A | Discharge status: Home / Self Care | Payer: No Typology Code available for payment source | Attending: Emergency Medicine | Admitting: Emergency Medicine

## 2022-05-08 ENCOUNTER — Emergency Department (HOSPITAL_BASED_OUTPATIENT_CLINIC_OR_DEPARTMENT_OTHER): Payer: No Typology Code available for payment source

## 2022-05-08 DIAGNOSIS — J210 Acute bronchiolitis due to respiratory syncytial virus: Secondary | ICD-10-CM | POA: Diagnosis not present

## 2022-05-08 DIAGNOSIS — Z85828 Personal history of other malignant neoplasm of skin: Secondary | ICD-10-CM | POA: Diagnosis not present

## 2022-05-08 DIAGNOSIS — Z79899 Other long term (current) drug therapy: Secondary | ICD-10-CM | POA: Diagnosis not present

## 2022-05-08 DIAGNOSIS — J40 Bronchitis, not specified as acute or chronic: Secondary | ICD-10-CM | POA: Diagnosis not present

## 2022-05-08 DIAGNOSIS — Z7982 Long term (current) use of aspirin: Secondary | ICD-10-CM | POA: Diagnosis not present

## 2022-05-08 DIAGNOSIS — E039 Hypothyroidism, unspecified: Secondary | ICD-10-CM | POA: Diagnosis not present

## 2022-05-08 DIAGNOSIS — I10 Essential (primary) hypertension: Secondary | ICD-10-CM | POA: Insufficient documentation

## 2022-05-08 DIAGNOSIS — Z8541 Personal history of malignant neoplasm of cervix uteri: Secondary | ICD-10-CM | POA: Insufficient documentation

## 2022-05-08 DIAGNOSIS — J329 Chronic sinusitis, unspecified: Secondary | ICD-10-CM | POA: Insufficient documentation

## 2022-05-08 DIAGNOSIS — R0602 Shortness of breath: Secondary | ICD-10-CM | POA: Diagnosis present

## 2022-05-08 DIAGNOSIS — Z1152 Encounter for screening for COVID-19: Secondary | ICD-10-CM | POA: Diagnosis not present

## 2022-05-08 LAB — RESP PANEL BY RT-PCR (RSV, FLU A&B, COVID)  RVPGX2
Influenza A by PCR: NEGATIVE
Influenza B by PCR: NEGATIVE
Resp Syncytial Virus by PCR: POSITIVE — AB
SARS Coronavirus 2 by RT PCR: NEGATIVE

## 2022-05-08 MED ORDER — PREDNISONE 20 MG PO TABS: 60.0000 mg | ORAL_TABLET | Freq: Every day | ORAL | 0 refills | Status: AC

## 2022-05-08 MED ORDER — AMOXICILLIN-POT CLAVULANATE 875-125 MG PO TABS: 1.0000 | ORAL_TABLET | Freq: Two times a day (BID) | ORAL | 0 refills | Status: DC

## 2022-05-08 NOTE — ED Provider Notes (Cosign Needed)
[facility] EMERGENCY DEPARTMENT Provider Note   CSN: [id number] Arrival date & time: 05/08/22  1230     History  Chief Complaint  Patient presents with   Shortness of Breath    [name] is a 66 y.o. female.  66 year old female presents today for evaluation of sinus congestion, sinus headache, postnasal drip, shortness of breath that has been ongoing since October worse in the past week or so.  She denies any chest pain.  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  She denies fever.  Denies any leg pain, leg swelling, prior history of DVT, or PE, recent long travel.  The history is provided by the patient. No language interpreter was used.       Home Medications Prior to Admission medications   Medication Sig Start Date End Date Taking? Authorizing Provider  albuterol (VENTOLIN HFA) 108 (90 Base) MCG/ACT inhaler Inhale 2 puffs into the lungs as needed for wheezing or shortness of breath.    [provider]  Ascorbic Acid (VITAMIN C) 1000 MG tablet Take 1,000 mg by mouth daily.    [provider]  aspirin EC 81 MG tablet Take 1 tablet (81 mg total) by mouth daily for 21 days. Swallow whole. 04/26/22 05/17/22  Jearld Lesch, PA  BIOTIN PO Take 1 tablet by mouth daily.    [provider]  CALCIUM PO Take 2 tablets by mouth daily.    [provider]  Cholecalciferol (VITAMIN D3) 1000 units CAPS Take 1,000 Units by mouth daily.    [provider]  DEVILS CLAW PO Take 1 tablet by mouth daily.    [provider]  DULOXETINE HCL PO Take 2 capsules by mouth daily.    [provider]  estradiol (VIVELLE-DOT) 0.1 MG/24HR patch Place 1 patch (0.1 mg total) onto the skin 2 (two) times a week. Patient taking differently: Place 1 patch onto the skin 2 (two) times a week. Placed Monday and Thursday 07/16/21   Guss Bunde, MD   Estradiol 10 MCG TABS vaginal tablet Place 10 mcg vaginally daily.    [provider]  fluticasone (FLONASE) 50 MCG/ACT nasal spray Place 2 sprays into both nostrils as needed for allergies or rhinitis.    [provider]  GLUTATHIONE PO Take 1-2 tablets by mouth daily.    [provider]  HYDROmorphone (DILAUDID) 2 MG tablet Take 1 tablet (2 mg total) by mouth every 6 (six) hours as needed for severe pain. 04/26/22   Jearld Lesch, PA  KRILL OIL PO Take 1 capsule by mouth daily.    [provider]  L-Arginine 1000 MG TABS Take 1,000 mg by mouth daily.    [provider]  lisinopril (ZESTRIL) 10 MG tablet Take 10 mg by mouth See admin instructions. Take 10 mg by mouth 2-3 times day    [provider]  MAGNESIUM CITRATE PO Take 1 tablet by mouth daily.    [provider]  methocarbamol (ROBAXIN) 500 MG tablet Take 1 tablet (500 mg total) by mouth every 6 (six) hours as needed for muscle spasms. 04/26/22   Jearld Lesch, PA  montelukast (SINGULAIR) 10 MG tablet Take 10 mg by mouth daily as needed (shortness of breath). 04/23/19   [provider]  Niacin (VITAMIN B-3 PO) Take 1 tablet by mouth daily.    [provider]  NIACINAMIDE PO Take 1 tablet by mouth daily.    [provider]  rOPINIRole (REQUIP) 1 MG tablet Take 1-2 mg by mouth See admin instructions. 1 mg in the morning, 2 mg in the evening.    [provider]  Thiamine HCl (VITAMIN B-1 PO) Take 1 tablet by mouth daily.    [provider]  thyroid (NP THYROID) 15 MG tablet Take 15 mg by mouth daily.    [provider]  thyroid (NP THYROID) 90 MG tablet Take 90 mg by mouth daily.    [provider]  traMADol (ULTRAM) 50 MG tablet Take 50-100 mg by mouth as needed for moderate pain. 09/04/21   [provider]  traMADol (ULTRAM) 50 MG tablet Take 1-2 tablets (50-100 mg total) by mouth every 6 (six) hours  as needed for moderate pain. 04/26/22   Jearld Lesch, PA  Triprolidine-Pseudoephedrine (ANTIHISTAMINE PO) Take 1 tablet by mouth as needed (congestion).    [provider]  valACYclovir (VALTREX) 500 MG tablet Take 500 mg by mouth daily.    [provider]  vitamin B-12 (CYANOCOBALAMIN) 1000 MCG tablet Take 1,000 mcg by mouth daily.    [provider]      Allergies    Anesthetics, ester; Lidocaine; Other; Oxycodone-acetaminophen; Sulfa antibiotics; Sulfasalazine; Amlodipine; Codeine; Hydrocodone; Lisinopril; Oxycodone; Prednisone; and Ropinirole    Review of Systems   Review of Systems  Constitutional:  Negative for chills and fever.  HENT:  Positive for congestion and postnasal drip. Negative for sore throat.   Respiratory:  Positive for cough and shortness of breath.   Cardiovascular:  Negative for chest pain and leg swelling.  Neurological:  Positive for headaches. Negative for light-headedness.  All other systems reviewed and are negative.   Physical Exam Updated Vital Signs BP 130/81 (BP Location: Left Arm)   Pulse (!) 104   Temp 98.1 F (36.7 C) (Oral)   Resp (!) 24   Ht 5' (1.524 m)   Wt 68 kg   SpO2 100%   BMI 29.29 kg/m  Physical Exam Vitals and nursing note reviewed.  Constitutional:      General: She is not in acute distress.    Appearance: Normal appearance. She is not ill-appearing.  HENT:     Head: Normocephalic and atraumatic.     Nose: Nose normal.  Eyes:     General: No scleral icterus.    Extraocular Movements: Extraocular movements intact.     Conjunctiva/sclera: Conjunctivae normal.  Cardiovascular:     Rate and Rhythm: Normal rate and regular rhythm.     Pulses: Normal pulses.     Heart sounds: Normal heart sounds.  Pulmonary:     Effort: Pulmonary effort is normal. No respiratory distress.     Breath sounds: Wheezing (Occasional wheeze noted in the right upper lung field) present. No rales.  Musculoskeletal:         General: Normal range of motion.     Cervical back: Normal range of motion.  Skin:    General: Skin is warm and dry.  Neurological:     General: No focal deficit present.     Mental Status: She is alert. Mental status is at baseline.     ED Results / Procedures / Treatments   Labs (all labs ordered are listed, but only abnormal results are displayed) Labs Reviewed  RESP PANEL BY RT-PCR (RSV, FLU A&B, COVID)  RVPGX2 - Abnormal; Notable for the following components:  Result Value   Resp Syncytial Virus by PCR POSITIVE (*)    All other components within normal limits    EKG None  Radiology DG Chest 2 View  Result Date: 05/08/2022 CLINICAL DATA:  Cough and shortness of breath. EXAM: CHEST - 2 VIEW COMPARISON:  12/01/2021 and prior studies FINDINGS: The cardiomediastinal silhouette is unremarkable. There is no evidence of focal airspace disease, pulmonary edema, suspicious pulmonary nodule/mass, pleural effusion, or pneumothorax. No acute bony abnormalities are identified. A thoracic scoliosis is again noted. IMPRESSION: No active cardiopulmonary disease. Electronically Signed   By: Margarette Canada M.D.   On: 05/08/2022 13:25    Procedures Procedures    Medications Ordered in ED Medications - No data to display  ED Course/ Medical Decision Making/ A&P Clinical Course as of 05/08/22 1544  Sat May 08, 2022  1518 RSV + Tonette Bihari OK. XR without CAP Likely bronchitis and supportive care. [CC]    Clinical Course User Index [CC] Tretha Sciara, MD                           Medical Decision Making Amount and/or Complexity of Data Reviewed Radiology: ordered.   Medical Decision Making / ED Course   This patient presents to the ED for concern of shortness of breath, cough, sinus congestion, this involves an extensive number of treatment options, and is a complaint that carries with it a high risk of complications and morbidity.  The differential diagnosis includes  sinusitis, bronchitis, viral URI, pneumonia, PE  MDM: 66 year old female presents today for evaluation of above-mentioned complaints.  She is overall well-appearing.  Complains of sinus congestion and sinus headache.  Symptoms ongoing since October. 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  No suspicion for PE as she is not hypoxic and maintaining 100% on room air.  Initially tachycardic however resolved and in the low 90s during my interview.  She states her heart rate stays elevated at baseline.  Reports recent revision procedure of her right hip.  Follows up with her surgeon on Tuesday.  No peripheral edema.  Likely has sinusitis with associated bronchitis with the acute RSV infection.  Will start patient on Augmentin, prednisone.  She has albuterol inhaler at home.  She states she has had multiple recurrent sinus infections.  States she is her ENT told her if she had another recurrent sinus infection that she will require a procedure for her sinus.  She did notify ENT on Thursday regarding her current symptoms.  Has not received a call back.  Strict return precautions discussed with patient particularly if she develops chest pain.  Patient is appropriate for discharge.  Discharged in stable condition.  Chest x-ray obtained and does not show evidence of pneumonia. Discussed use of probiotics along with Augmentin.  Lab Tests: -I ordered, reviewed, and interpreted labs.   The pertinent results include:   Labs Reviewed  RESP PANEL BY RT-PCR (RSV, FLU A&B, COVID)  RVPGX2 - Abnormal; Notable for the following components:      Result Value   Resp Syncytial Virus by PCR POSITIVE (*)    All other components within normal limits      EKG  EKG Interpretation  Date/Time:    Ventricular Rate:    PR Interval:    QRS Duration:   QT Interval:    QTC Calculation:   R Axis:     Text Interpretation:           Imaging Studies  ordered: I ordered imaging studies including chest x-ray I independently visualized and  interpreted imaging. I agree with the radiologist interpretation   Medicines ordered and prescription drug management: No orders of the defined types were placed in this encounter.   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  Discharged in stable condition.  Return precaution discussed.  Patient voices understanding and is in agreement with plan.   Final Clinical Impression(s) / ED Diagnoses Final diagnoses:  Sinusitis, unspecified chronicity, unspecified location  RSV (acute bronchiolitis due to respiratory syncytial virus)  Bronchitis    Rx / DC Orders ED Discharge Orders          Ordered    predniSONE (DELTASONE) 20 MG tablet  Daily with breakfast       Note to Pharmacy: Patient is able to tolerate prednisone   05/08/22 1611    amoxicillin-clavulanate (AUGMENTIN) 875-125 MG tablet  Every 12 hours        05/08/22 1611              Evlyn Courier, PA-C 05/08/22 1613

## 2022-05-08 NOTE — ED Triage Notes (Signed)
Pt w/ cough and SHOB episodes since Oct; sts worse now and feels like she has fluid in her lungs

## 2022-05-08 NOTE — Discharge Instructions (Signed)
Your workup today was overall reassuring.  X-ray did not show evidence of pneumonia.  For your sinusitis I have prescribed Augmentin.  For the bronchitis I have sent prednisone.  Along with the Augmentin please take probiotics to limit risk of C. difficile.  Continue using the Net[employer].  Continue using your albuterol inhaler as you need to.  For any concerning symptoms return to the emergency room otherwise follow-up with your primary care provider or ENT.

## 2022-10-22 NOTE — Progress Notes (Unsigned)
Last Mammogram: 05/16/19- negative Last Pap Smear:  04/15/22- ASCUS Last Colon Screening;  age 67 Seat Belts:   Yes Sun Screen:   yes Dental Check Up:  yes Brush & Floss:  yes   Subjective:     [name] is a 67 y.o. female here for a routine exam.  Current complaints: back pain--seeing spine MD.  Ramped up vaginal estrogen before this pap smear.    Gynecologic History No LMP recorded. Patient is postmenopausal. Contraception: post menopausal status Last Mammogram: 05/16/19- negative Last Pap Smear:  04/15/22- ASCUS Last Colon Screening;  age 46 Seat Belts:   Yes Sun Screen:   yes Dental Check Up:  yes Brush & Floss:  yes   Obstetric History OB History  Gravida Para Term Preterm AB Living  3 3 3     3   SAB IAB Ectopic Multiple Live Births               # Outcome Date GA Lbr Len/2nd Weight Sex Delivery Anes PTL Lv  3 Term      CS-Unspec     2 Term           1 Term      CS-Unspec        The following portions of the patient's history were reviewed and updated as appropriate: allergies, current medications, past family history, past medical history, past social history, past surgical history, and problem list.  Review of Systems Pertinent items noted in HPI and remainder of comprehensive ROS otherwise negative.    Objective:     Vitals:   10/25/22 1047  BP: (!) 159/92  Pulse: 94  Resp: 16  Weight: 161 lb (73 kg)  Height: 5\' 3"  (1.6 m)   Vitals:  WNL General appearance: alert, cooperative and no distress  HEENT: Normocephalic, without obvious abnormality, atraumatic Eyes: negative Throat: lips, mucosa, and tongue normal; teeth and gums normal  Respiratory: Clear to auscultation bilaterally  CV: Regular rate and rhythm  Breasts:  Normal appearance, no masses or tenderness, no nipple retraction or dimpling; implants  GI: Soft, non-tender; bowel sounds normal; no masses,  no organomegaly  GU: External Genitalia:  Tanner V, no lesion Urethra:  No prolapse    Vagina: Pink, normal rugae, no blood or discharge  Cervix: Surgically absent   Uterus:  Surgically absent  Adnexa: Normal, no masses, non tender  Musculoskeletal: No edema, redness or tenderness in the calves or thighs; left knee swelling from fall (x ray neg)  Skin: No lesions or rash  Lymphatic: Axillary adenopathy: none     Psychiatric: Normal mood and behavior        Assessment:    Healthy female exam.    Plan:   Pt used vagifem nightly for 2 weeks prior to PAP (has recurrent ASCUS, HPV neg--no cervix)  Decrease patch form 0.075 to 0.05 and continue to ween as tolerated   BP elevated--pt has PCP to follow up with.    Recommend colon screening--[facility] patient.

## 2022-10-25 ENCOUNTER — Ambulatory Visit (INDEPENDENT_AMBULATORY_CARE_PROVIDER_SITE_OTHER): Payer: Medicare HMO | Admitting: Obstetrics & Gynecology

## 2022-10-25 ENCOUNTER — Other Ambulatory Visit (HOSPITAL_COMMUNITY)
Admission: RE | Admit: 2022-10-25 | Discharge: 2022-10-25 | Disposition: A | Discharge status: Home / Self Care | Payer: Medicare HMO | Source: Ambulatory Visit | Attending: Obstetrics & Gynecology | Admitting: Obstetrics & Gynecology

## 2022-10-25 ENCOUNTER — Encounter: Payer: Self-pay | Admitting: Obstetrics & Gynecology

## 2022-10-25 VITALS — BP 152/99 | HR 91 | Resp 16 | Ht 63.0 in | Wt 161.0 lb

## 2022-10-25 DIAGNOSIS — Z01419 Encounter for gynecological examination (general) (routine) without abnormal findings: Secondary | ICD-10-CM

## 2022-10-25 DIAGNOSIS — R87619 Unspecified abnormal cytological findings in specimens from cervix uteri: Secondary | ICD-10-CM | POA: Diagnosis not present

## 2022-10-25 DIAGNOSIS — Z01411 Encounter for gynecological examination (general) (routine) with abnormal findings: Secondary | ICD-10-CM | POA: Diagnosis present

## 2022-10-25 DIAGNOSIS — N951 Menopausal and female climacteric states: Secondary | ICD-10-CM

## 2022-10-25 DIAGNOSIS — Z1151 Encounter for screening for human papillomavirus (HPV): Secondary | ICD-10-CM | POA: Insufficient documentation

## 2022-10-25 MED ORDER — ESTRADIOL 0.05 MG/24HR TD PTTW: 1.0000 | MEDICATED_PATCH | TRANSDERMAL | 12 refills | Status: AC

## 2022-10-25 NOTE — Addendum Note (Signed)
Addended by: Granville Lewis on: 10/25/2022 11:27 AM   Modules accepted: Orders

## 2022-10-26 ENCOUNTER — Telehealth: Payer: Self-pay | Admitting: *Deleted

## 2022-10-26 NOTE — Telephone Encounter (Signed)
[name] with [facility] messages that a referral is needed for patient's annual well woman exam that was on 10/25/2022.

## 2022-10-28 ENCOUNTER — Encounter: Payer: Self-pay | Admitting: Obstetrics & Gynecology

## 2022-10-28 LAB — CYTOLOGY - PAP
Adequacy: ABSENT
Comment: NEGATIVE
Comment: NEGATIVE
Comment: NEGATIVE
Diagnosis: UNDETERMINED — AB
HPV 16: NEGATIVE
HPV 18 / 45: NEGATIVE
High risk HPV: POSITIVE — AB

## 2022-11-02 ENCOUNTER — Telehealth: Payer: Self-pay | Admitting: *Deleted

## 2022-11-02 NOTE — Telephone Encounter (Signed)
Left a detailed message for urgent request for medical records to be sent to the following: Dr. Dani Gobble [facility] [phone] - Fax# [phone] Ext. [phone]

## 2022-11-02 NOTE — Telephone Encounter (Signed)
Patient called to give information on referral process for the [place].

## 2022-11-02 NOTE — Telephone Encounter (Signed)
Left a message to call the office to discuss referral process.

## 2022-11-05 ENCOUNTER — Telehealth: Payer: Self-pay | Admitting: *Deleted

## 2022-11-05 NOTE — Telephone Encounter (Signed)
-----   Message from Pennie Banter sent at 10/26/2022 10:43 AM EDT ----- Regarding: [facility] referral Does this patient have a VA referral?  Usually they fax over a form with the visit authorization number.  I need that to attach to the claim.

## 2022-11-05 NOTE — Telephone Encounter (Signed)
Left Kisha a message to call the office to help me with this referral.

## 2022-11-08 ENCOUNTER — Telehealth: Payer: Self-pay | Admitting: *Deleted

## 2022-11-08 NOTE — Telephone Encounter (Signed)
Left a message for [name] to fax a request for medical records from the [place] or call the office with any questions.

## 2022-11-22 ENCOUNTER — Ambulatory Visit (INDEPENDENT_AMBULATORY_CARE_PROVIDER_SITE_OTHER): Payer: Medicare HMO | Admitting: Obstetrics & Gynecology

## 2022-11-22 ENCOUNTER — Encounter: Payer: Self-pay | Admitting: Obstetrics & Gynecology

## 2022-11-22 VITALS — BP 149/95 | HR 96 | Resp 16 | Ht 63.0 in | Wt 159.0 lb

## 2022-11-22 DIAGNOSIS — R87811 Vaginal high risk human papillomavirus (HPV) DNA test positive: Secondary | ICD-10-CM | POA: Diagnosis not present

## 2022-11-22 DIAGNOSIS — R87620 Atypical squamous cells of undetermined significance on cytologic smear of vagina (ASC-US): Secondary | ICD-10-CM

## 2022-11-22 NOTE — Progress Notes (Signed)
Colposcopy Procedure Note  Indications:  2017--Nml Pap, HPV negative 2018--Low grade, +HPV 2018--ECC Low grade on colposcopy 2018--LEEP low grade with negative ECC after LEEP 2019--Nml cytology, +HPV 2020--High Grade pap, negative HPV 2021--colpo-biopsy negative, negative ECC March 2021--LSIL, HPV negative April 2021--LEEP--CIN 11 Mar 2020--[facility]--CERVIX NEGATIVE FOR DYSPLASIA May 2023--LSIL vaginal cuff December 2023--ASCUS, HPV negative June 2024--ASCU HPV +    Procedure Details  The risks and benefits of the procedure and Written informed consent obtained.  Speculum placed in vagina and excellent visualization of cervix achieved, cervix swabbed x 3 with acetic acid solution.  Findings: Cervix: Cervical surgically absent (benign path at [facility]) Vaginal inspection: normal without visible lesions. Vulvar colposcopy: vulvar colposcopy not performed.  Specimens: none  Complications: none.  Plan: Rpt pap in 6 months

## 2022-12-11 ENCOUNTER — Encounter (HOSPITAL_COMMUNITY): Payer: Self-pay

## 2022-12-11 ENCOUNTER — Other Ambulatory Visit: Payer: Self-pay

## 2022-12-11 ENCOUNTER — Emergency Department (HOSPITAL_COMMUNITY): Payer: No Typology Code available for payment source

## 2022-12-11 ENCOUNTER — Emergency Department (HOSPITAL_COMMUNITY)
Admission: EM | Admit: 2022-12-11 | Discharge: 2022-12-11 | Disposition: A | Discharge status: Home / Self Care | Payer: No Typology Code available for payment source | Attending: Emergency Medicine | Admitting: Emergency Medicine

## 2022-12-11 DIAGNOSIS — E039 Hypothyroidism, unspecified: Secondary | ICD-10-CM | POA: Insufficient documentation

## 2022-12-11 DIAGNOSIS — Z8541 Personal history of malignant neoplasm of cervix uteri: Secondary | ICD-10-CM | POA: Diagnosis not present

## 2022-12-11 DIAGNOSIS — Z79899 Other long term (current) drug therapy: Secondary | ICD-10-CM | POA: Diagnosis not present

## 2022-12-11 DIAGNOSIS — R1012 Left upper quadrant pain: Secondary | ICD-10-CM | POA: Diagnosis not present

## 2022-12-11 DIAGNOSIS — Z7951 Long term (current) use of inhaled steroids: Secondary | ICD-10-CM | POA: Diagnosis not present

## 2022-12-11 DIAGNOSIS — R109 Unspecified abdominal pain: Secondary | ICD-10-CM

## 2022-12-11 DIAGNOSIS — R1032 Left lower quadrant pain: Secondary | ICD-10-CM | POA: Diagnosis not present

## 2022-12-11 DIAGNOSIS — I10 Essential (primary) hypertension: Secondary | ICD-10-CM | POA: Diagnosis not present

## 2022-12-11 DIAGNOSIS — J45909 Unspecified asthma, uncomplicated: Secondary | ICD-10-CM | POA: Diagnosis not present

## 2022-12-11 LAB — URINALYSIS, ROUTINE W REFLEX MICROSCOPIC
Bilirubin Urine: NEGATIVE
Glucose, UA: NEGATIVE mg/dL
Hgb urine dipstick: NEGATIVE
Ketones, ur: NEGATIVE mg/dL
Leukocytes,Ua: NEGATIVE
Nitrite: NEGATIVE
Protein, ur: NEGATIVE mg/dL
Specific Gravity, Urine: 1.01 (ref 1.005–1.030)
pH: 6 (ref 5.0–8.0)

## 2022-12-11 LAB — COMPREHENSIVE METABOLIC PANEL
ALT: 17 U/L (ref 0–44)
AST: 20 U/L (ref 15–41)
Albumin: 4.2 g/dL (ref 3.5–5.0)
Alkaline Phosphatase: 52 U/L (ref 38–126)
Anion gap: 12 (ref 5–15)
BUN: 17 mg/dL (ref 8–23)
CO2: 27 mmol/L (ref 22–32)
Calcium: 9.1 mg/dL (ref 8.9–10.3)
Chloride: 99 mmol/L (ref 98–111)
Creatinine, Ser: 0.74 mg/dL (ref 0.44–1.00)
GFR, Estimated: >60 mL/min (ref >60)
Glucose, Bld: 109 mg/dL — ABNORMAL HIGH (ref 70–99)
Potassium: 3.8 mmol/L (ref 3.5–5.1)
Sodium: 138 mmol/L (ref 135–145)
Total Bilirubin: 0.4 mg/dL (ref 0.3–1.2)
Total Protein: 6.9 g/dL (ref 6.5–8.1)

## 2022-12-11 LAB — CBC
HCT: 40.9 % (ref 36.0–46.0)
Hemoglobin: 13.6 g/dL (ref 12.0–15.0)
MCH: 31.8 pg (ref 26.0–34.0)
MCHC: 33.3 g/dL (ref 30.0–36.0)
MCV: 95.6 fL (ref 80.0–100.0)
Platelets: 231 10*3/uL (ref 150–400)
RBC: 4.28 MIL/uL (ref 3.87–5.11)
RDW: 12.3 % (ref 11.5–15.5)
WBC: 5 10*3/uL (ref 4.0–10.5)
nRBC: 0 % (ref 0.0–0.2)

## 2022-12-11 LAB — LIPASE, BLOOD: Lipase: 45 U/L (ref 11–51)

## 2022-12-11 MED ORDER — FENTANYL CITRATE PF 50 MCG/ML IJ SOSY
50.0000 ug | PREFILLED_SYRINGE | Freq: Once | INTRAMUSCULAR | Status: AC
  Administered 2022-12-11: 50 ug via INTRAVENOUS
  Filled 2022-12-11: qty 1

## 2022-12-11 MED ORDER — SODIUM CHLORIDE (PF) 0.9 % IJ SOLN
INTRAMUSCULAR | Status: AC
  Filled 2022-12-11: qty 50

## 2022-12-11 MED ORDER — IOHEXOL 300 MG/ML  SOLN
100.0000 mL | Freq: Once | INTRAMUSCULAR | Status: AC | PRN
  Administered 2022-12-11: 100 mL via INTRAVENOUS

## 2022-12-11 NOTE — ED Triage Notes (Signed)
Pt coming in complaining of left sided abd pain that radiates into her back that began Thursday. Pt does endorse diarrhea yesterday, and pain increased pain after eating. Hx of diverticulitis.

## 2022-12-11 NOTE — Discharge Instructions (Signed)
You were seen in the ER complaining of abdominal pain.  As we discussed, your blood work and CT scan looked reassuring today. We did not find an emergent cause for your pain.   I recommend following up with your spinal doctor regarding your ongoing back pain.   Continue to monitor how you're doing and return to the ER for new or worsening symptoms.

## 2022-12-11 NOTE — ED Provider Notes (Signed)
[place] EMERGENCY DEPARTMENT AT [facility] Provider Note   CSN: [id number] Arrival date & time: 12/11/22  4098     History  Chief Complaint  Patient presents with   Abdominal Pain    [name] is a 67 y.o. female with history of hepatic cyst, anxiety, depression, asthma, cervical cancer, cholecystitis, GERD, hypertension, hypothyroidism, pancreatitis, s/p appendectomy, scoliosis, who presents the emergency department complaining of abdominal pain.  Patient localizes pain to her left abdomen. Had one episode of diarrhea last week, now resolved. Hx of diverticulitis. States pain feels somewhat different than that. No fever, chills, N/V.   Abdominal Pain      Home Medications Prior to Admission medications   Medication Sig Start Date End Date Taking? Authorizing Provider  albuterol (VENTOLIN HFA) 108 (90 Base) MCG/ACT inhaler Inhale 2 puffs into the lungs as needed for wheezing or shortness of breath.    [provider]  Ascorbic Acid (VITAMIN C) 1000 MG tablet Take 1,000 mg by mouth daily.    [provider]  baclofen (LIORESAL) 10 MG tablet Take 1 tablet by mouth daily. 08/21/19   [provider]  BIOTIN PO Take 1 tablet by mouth daily.    [provider]  calcium citrate (CALCITRATE - DOSED IN MG ELEMENTAL CALCIUM) 950 (200 Ca) MG tablet Take 1 tablet by mouth daily. 04/18/19   [provider]  Cholecalciferol (VITAMIN D3) 1000 units CAPS Take 1,000 Units by mouth daily.    [provider]  DEVILS CLAW PO Take 1 tablet by mouth daily.    [provider]  DULOXETINE HCL PO Take 2 capsules by mouth daily.    [provider]  estradiol (VIVELLE-DOT) 0.05 MG/24HR patch Place 1 patch (0.05 mg total) onto the skin 2 (two) times a week. 10/25/22   Lesly Dukes, MD  Estradiol 10 MCG TABS vaginal tablet Place 10 mcg vaginally daily.    [provider]  fluticasone (FLONASE) 50 MCG/ACT  nasal spray Place 2 sprays into both nostrils as needed for allergies or rhinitis.    [provider]  GLUTATHIONE PO Take 1-2 tablets by mouth daily. Patient not taking: Reported on 10/25/2022    [provider]  HYDROmorphone (DILAUDID) 2 MG tablet Take 1 tablet (2 mg total) by mouth every 6 (six) hours as needed for severe pain. Patient not taking: Reported on 10/25/2022 04/26/22   Eartha Inch, PA  KRILL OIL PO Take 1 capsule by mouth daily.    [provider]  lisinopril (ZESTRIL) 10 MG tablet Take 10 mg by mouth See admin instructions. Take 10 mg by mouth 2-3 times day    [provider]  MAGNESIUM CITRATE PO Take 1 tablet by mouth daily.    [provider]  methocarbamol (ROBAXIN) 500 MG tablet Take 1 tablet (500 mg total) by mouth every 6 (six) hours as needed for muscle spasms. Patient not taking: Reported on 10/25/2022 04/26/22   Eartha Inch, PA  montelukast (SINGULAIR) 10 MG tablet Take 10 mg by mouth daily as needed (shortness of breath). 04/23/19   [provider]  Niacin (VITAMIN B-3 PO) Take 1 tablet by mouth daily.    [provider]  NIACINAMIDE PO Take 1 tablet by mouth daily.    [provider]  rOPINIRole (REQUIP) 1 MG tablet Take 1-2 mg by mouth See admin instructions. 1 mg in the morning, 2 mg in the evening.    [provider]  Thiamine HCl (VITAMIN B-1 PO) Take 1 tablet by mouth daily.    [provider]  thyroid (NP THYROID) 15 MG tablet Take 15 mg by mouth daily.    [provider]  thyroid (NP THYROID) 90 MG tablet Take 90 mg by mouth daily.    [provider]  traMADol (ULTRAM) 50 MG tablet Take 50-100 mg by mouth as needed for moderate pain. 09/04/21   [provider]  traMADol (ULTRAM) 50 MG tablet Take 1-2 tablets (50-100 mg total) by mouth every 6 (six) hours as needed for moderate pain. 04/26/22   Eartha Inch, PA  valACYclovir (VALTREX)  500 MG tablet Take 500 mg by mouth daily.    [provider]  vitamin B-12 (CYANOCOBALAMIN) 1000 MCG tablet Take 1,000 mcg by mouth daily.    [provider]      Allergies    Anesthetics, ester; Lidocaine; Other; Oxycodone-acetaminophen; Sulfa antibiotics; Sulfasalazine; Amlodipine; Codeine; Hydrocodone; Lisinopril; Oxycodone; Prednisone; and Ropinirole    Review of Systems   Review of Systems  Gastrointestinal:  Positive for abdominal pain.  All other systems reviewed and are negative.   Physical Exam Updated Vital Signs BP (!) 148/90   Pulse 88   Temp 98 F (36.7 C) (Oral)   Resp 15   Ht 5' (1.524 m)   Wt 72.6 kg   SpO2 98%   BMI 31.25 kg/m  Physical Exam Vitals and nursing note reviewed.  Constitutional:      Appearance: Normal appearance.  HENT:     Head: Normocephalic and atraumatic.  Eyes:     Conjunctiva/sclera: Conjunctivae normal.  Cardiovascular:     Rate and Rhythm: Normal rate and regular rhythm.  Pulmonary:     Effort: Pulmonary effort is normal. No respiratory distress.     Breath sounds: Normal breath sounds.  Abdominal:     General: There is no distension.     Palpations: Abdomen is soft.     Tenderness: There is abdominal tenderness in the left upper quadrant and left lower quadrant.  Skin:    General: Skin is warm and dry.  Neurological:     General: No focal deficit present.     Mental Status: She is alert.     ED Results / Procedures / Treatments   Labs (all labs ordered are listed, but only abnormal results are displayed) Labs Reviewed  COMPREHENSIVE METABOLIC PANEL - Abnormal; Notable for the following components:      Result Value   Glucose, Bld 109 (*)    All other components within normal limits  LIPASE, BLOOD  CBC  URINALYSIS, ROUTINE W REFLEX MICROSCOPIC    EKG None  Radiology CT ABDOMEN PELVIS W CONTRAST  Result Date: 12/11/2022 CLINICAL DATA:  Left lower quadrant abdominal pain EXAM: CT ABDOMEN AND  PELVIS WITH CONTRAST TECHNIQUE: Multidetector CT imaging of the abdomen and pelvis was performed using the standard protocol following bolus administration of intravenous contrast. RADIATION DOSE REDUCTION: This exam was performed according to the departmental dose-optimization program which includes automated exposure control, adjustment of the mA and/or kV according to patient size and/or use of iterative reconstruction technique. CONTRAST:  OMNIPAQUE IOHEXOL 300 MG/ML  SOLN COMPARISON:  12/01/21 FINDINGS: Lower chest: No acute abnormality. Hepatobiliary: Peripheral subcapsular low-density structure in the right lobe of liver measures 1.4 cm, image 24/2. Previously this measured 1.3 cm and most likely reflects a benign process such as a liver cyst. No suspicious liver lesions identified. Gallbladder normal. No bile  duct dilatation. Pancreas: Unremarkable. No pancreatic ductal dilatation or surrounding inflammatory changes. Spleen: Normal in size without focal abnormality. Adrenals/Urinary Tract: Normal adrenal glands. No nephrolithiasis, hydronephrosis or suspicious mass. Urinary bladder is partially obscured by streak artifact from bilateral hip arthroplasty devices. No focal abnormality noted. Stomach/Bowel: Stomach is normal. Status post appendectomy. No pathologic dilatation of the large or small bowel loops. Scattered colonic diverticula with sigmoid diverticulosis. No signs of acute diverticulitis. No bowel wall thickening or inflammation identified Vascular/Lymphatic: Aortic atherosclerosis. No signs of abdominopelvic adenopathy. Reproductive: Status post hysterectomy.  No adnexal mass identified. Other: No ascites or focal fluid collections. No signs of pneumoperitoneum. Small fat containing umbilical hernia. Musculoskeletal: Bilateral implant augmentation of the breast. Thoracolumbar scoliosis. Multilevel lumbar degenerative disc disease and facet arthropathy. Status post bilateral hip  arthroplasties. IMPRESSION: 1. No acute findings in the abdomen or pelvis. 2. Sigmoid diverticulosis without signs of acute diverticulitis. 3. Aortic Atherosclerosis (ICD10-I70.0). Electronically Signed   By: Signa Kell M.D.   On: 12/11/2022 13:15    Procedures Procedures    Medications Ordered in ED Medications  fentaNYL (SUBLIMAZE) injection 50 mcg (50 mcg Intravenous Given 12/11/22 1226)  iohexol (OMNIPAQUE) 300 MG/ML solution 100 mL (100 mLs Intravenous Contrast Given 12/11/22 1242)    ED Course/ Medical Decision Making/ A&P                                 Medical Decision Making Amount and/or Complexity of Data Reviewed Labs: ordered. Radiology: ordered.  Risk Prescription drug management.   This patient is a 67 y.o. female  who presents to the ED for concern of abdominal pain x 4 days.   Differential diagnoses prior to evaluation: The emergent differential diagnosis includes, but is not limited to,  AAA, mesenteric ischemia, appendicitis, diverticulitis, DKA, gastroenteritis, nephrolithiasis, pancreatitis, constipation, UTI, bowel obstruction, biliary disease, IBD, PUD, hepatitis, ectopic pregnancy, ovarian torsion, PID. This is not an exhaustive differential.   Past Medical History / Co-morbidities / Social History: hepatic cyst, anxiety, depression, asthma, cervical cancer, cholecystitis, GERD, hypertension, hypothyroidism, pancreatitis, s/p appendectomy, scoliosis  Physical Exam: Physical exam performed. The pertinent findings include: Mildly hypertensive, otherwise normal vital signs.  Diffuse left-sided abdominal tenderness to palpation, without guarding.  Abdomen overall soft.  Lab Tests/Imaging studies: I personally interpreted labs/imaging and the pertinent results include: CBC and CMP unremarkable.  Normal lipase.  Urinalysis negative.  CT abdomen pelvis with diverticulosis, no active diverticulitis.  No other acute findings in the abdomen.. I agree with the  radiologist interpretation.  Medications: I ordered medication including fentanyl.  I have reviewed the patients home medicines and have made adjustments as needed.  Upon reevaluation patient states that pain has improved.   Disposition: After consideration of the diagnostic results and the patients response to treatment, I feel that emergency department workup does not suggest an emergent condition requiring admission or immediate intervention beyond what has been performed at this time. The plan is: Discharged home with symptomatic management of acute abdominal pain.  Patient has concerns that her pain could be referred from her back, she has severe scoliosis.  She does follow with a spine specialist, and plans to follow-up with them regarding her pain.  She does not have any other gastrointestinal symptoms that would increase my suspicion for another etiology.  Overall workup and exam is reassuring.  The patient is safe for discharge and has been instructed to return immediately for worsening  symptoms, change in symptoms or any other concerns.  Final Clinical Impression(s) / ED Diagnoses Final diagnoses:  Left sided abdominal pain    Rx / DC Orders ED Discharge Orders     None      Portions of this report may have been transcribed using voice recognition software. Every effort was made to ensure accuracy; however, inadvertent computerized transcription errors may be present.    Jeanella Flattery 12/11/22 1415    Lorre Nick, MD 12/12/22 [phone]

## 2022-12-11 NOTE — ED Provider Triage Note (Signed)
Emergency Medicine Provider Triage Evaluation Note  [name] , a 67 y.o. female  was evaluated in triage.  Pt complains of abdominal pain x 3 days, worsening last night. Localizes to LLQ. Hx of diverticulitis. Had some diarrhea a week ago that resolved. Tried tramadol last night without relief.   Review of Systems  Positive: Abd pain Negative: Fever, urinary sx, vaginal bleeding or discharge, N/V/D  Physical Exam  BP (!) 148/102 (BP Location: Left Arm)   Pulse 95   Temp 98.2 F (36.8 C) (Oral)   Resp 16   Ht 5' (1.524 m)   Wt 72.6 kg   SpO2 100%   BMI 31.25 kg/m  Gen:   Awake, no distress   Resp:  Normal effort  MSK:   Moves extremities without difficulty  Other:  Epigastric and left sided abdominal TTP  Medical Decision Making  Medically screening exam initiated at 9:11 AM.  Appropriate orders placed.  [name] was informed that the remainder of the evaluation will be completed by another provider, this initial triage assessment does not replace that evaluation, and the importance of remaining in the ED until their evaluation is complete.  Workup initiated including CT imaging   Christina Jolly T, PA-C 12/11/22 0102

## 2023-01-14 ENCOUNTER — Encounter: Payer: Self-pay | Admitting: Orthopedic Surgery

## 2023-01-18 ENCOUNTER — Other Ambulatory Visit (HOSPITAL_COMMUNITY): Payer: Self-pay | Admitting: Orthopedic Surgery

## 2023-01-18 DIAGNOSIS — Z96652 Presence of left artificial knee joint: Secondary | ICD-10-CM

## 2023-01-28 ENCOUNTER — Encounter (HOSPITAL_COMMUNITY)
Admission: RE | Admit: 2023-01-28 | Discharge: 2023-01-28 | Disposition: A | Discharge status: Home / Self Care | Payer: No Typology Code available for payment source | Source: Ambulatory Visit | Attending: Orthopedic Surgery | Admitting: Orthopedic Surgery

## 2023-01-28 DIAGNOSIS — Z96652 Presence of left artificial knee joint: Secondary | ICD-10-CM | POA: Diagnosis present

## 2023-01-28 MED ORDER — TECHNETIUM TC 99M MEDRONATE IV KIT
20.0000 | PACK | Freq: Once | INTRAVENOUS | Status: AC | PRN
  Administered 2023-01-28: 21.4 via INTRAVENOUS

## 2023-07-14 NOTE — Progress Notes (Addendum)
 COVID Vaccine Completed: no  Date of COVID positive in last 90 days: no  PCP - [facility] Cardiologist - n/a  Chest x-ray - 11/15/22 [facility] EKG - 09/06/22 [facility] Stress Test - n/a ECHO - n/a Cardiac Cath - n/a Pacemaker/ICD device last checked: n/a Spinal Cord Stimulator:  Bowel Prep - no  Sleep Study - n/a CPAP -   Fasting Blood Sugar - n/a Checks Blood Sugar _____ times a day  Last dose of GLP1 agonist-  N/A GLP1 instructions:  Hold 7 days before surgery    Last dose of SGLT-2 inhibitors-  N/A SGLT-2 instructions:  Hold 3 days before surgery    Blood Thinner Instructions:  Last dose: n/a  Time: Aspirin Instructions: Last Dose:  Activity level: Can go up a flight of stairs and perform activities of daily living without stopping and without symptoms of chest pain or shortness of breath.  Anesthesia review:   Patient denies shortness of breath, fever, cough and chest pain at PAT appointment  Patient verbalized understanding of instructions that were given to them at the PAT appointment. Patient was also instructed that they will need to review over the PAT instructions again at home before surgery.

## 2023-07-14 NOTE — Patient Instructions (Addendum)
 SURGICAL WAITING ROOM VISITATION  Patients having surgery or a procedure may have no more than 2 support people in the waiting area - these visitors may rotate.    Children under the age of 55 must have an adult with them who is not the patient.  Due to an increase in RSV and influenza rates and associated hospitalizations, children ages 49 and under may not visit patients in [facility] hospitals.  Visitors with respiratory illnesses are discouraged from visiting and should remain at home.  If the patient needs to stay at the hospital during part of their recovery, the visitor guidelines for inpatient rooms apply. Pre-op nurse will coordinate an appropriate time for 1 support person to accompany patient in pre-op.  This support person may not rotate.    Please refer to the [facility] website for the visitor guidelines for Inpatients (after your surgery is over and you are in a regular room).    Your procedure is scheduled on: 07/27/23   Report to [facility] Main Entrance    Report to admitting at 7:00 AM   Call this number if you have problems the morning of surgery [phone]   Do not eat food :After Midnight.   After Midnight you may have the following liquids until 6:30 AM DAY OF SURGERY  Water Non-Citrus Juices (without pulp, NO RED-Apple, White grape, White cranberry) Black Coffee (NO MILK/CREAM OR CREAMERS, sugar ok)  Clear Tea (NO MILK/CREAM OR CREAMERS, sugar ok) regular and decaf                             Plain Jell-O (NO RED)                                           Fruit ices (not with fruit pulp, NO RED)                                     Popsicles (NO RED)                                                               Sports drinks like Gatorade (NO RED)     The day of surgery:  Drink ONE (1) Pre-Surgery Clear Ensure at 6:30 AM the morning of surgery. Drink in one sitting. Do not sip.  This drink was given to you during your hospital  pre-op  appointment visit. Nothing else to drink after completing the  Pre-Surgery Clear Ensure.          If you have questions, please contact your surgeon's office.   FOLLOW BOWEL PREP AND ANY ADDITIONAL PRE OP INSTRUCTIONS YOU RECEIVED FROM YOUR SURGEON'S OFFICE!!!     Oral Hygiene is also important to reduce your risk of infection.                                    Remember - BRUSH YOUR TEETH THE MORNING OF SURGERY WITH YOUR REGULAR TOOTHPASTE  DENTURES WILL BE REMOVED PRIOR TO SURGERY PLEASE DO NOT APPLY "Poly grip" OR ADHESIVES!!!   Stop all vitamins and herbal supplements 7 days before surgery.   Take these medicines the morning of surgery with A SIP OF WATER: Inhalers, Thyroid, Tramadol                               You may not have any metal on your body including hair pins, jewelry, and body piercing             Do not wear make-up, lotions, powders, perfumes, or deodorant  Do not wear nail polish including gel and S&S, artificial/acrylic nails, or any other type of covering on natural nails including finger and toenails. If you have artificial nails, gel coating, etc. that needs to be removed by a nail salon please have this removed prior to surgery or surgery may need to be canceled/ delayed if the surgeon/ anesthesia feels like they are unable to be safely monitored.   Do not shave  48 hours prior to surgery.    Do not bring valuables to the hospital. [facility] [place] [facility] IS NOT             RESPONSIBLE   FOR VALUABLES.   Contacts, glasses, dentures or bridgework may not be worn into surgery.   Bring small overnight bag day of surgery.   DO NOT BRING YOUR HOME MEDICATIONS TO THE HOSPITAL. PHARMACY WILL DISPENSE MEDICATIONS LISTED ON YOUR MEDICATION LIST TO YOU DURING YOUR ADMISSION IN THE HOSPITAL!              Please read over the following fact sheets you were given: IF YOU HAVE QUESTIONS ABOUT YOUR PRE-OP INSTRUCTIONS PLEASE CALL [phone] 1288[name]    If you received a COVID  test during your pre-op visit  it is requested that you wear a mask when out in public, stay away from anyone that may not be feeling well and notify your surgeon if you develop symptoms. If you test positive for Covid or have been in contact with anyone that has tested positive in the last 10 days please notify you surgeon.      Pre-operative 5 CHG Bath Instructions   You can play a key role in reducing the risk of infection after surgery. Your skin needs to be as free of germs as possible. You can reduce the number of germs on your skin by washing with CHG (chlorhexidine gluconate) soap before surgery. CHG is an antiseptic soap that kills germs and continues to kill germs even after washing.   DO NOT use if you have an allergy to chlorhexidine/CHG or antibacterial soaps. If your skin becomes reddened or irritated, stop using the CHG and notify one of our RNs at [phone].   Please shower with the CHG soap starting 4 days before surgery using the following schedule:     Please keep in mind the following:  DO NOT shave, including legs and underarms, starting the day of your first shower.   You may shave your face at any point before/day of surgery.  Place clean sheets on your bed the day you start using CHG soap. Use a clean washcloth (not used since being washed) for each shower. DO NOT sleep with pets once you start using the CHG.   CHG Shower Instructions:  If you choose to wash your hair and private area, wash first with your normal shampoo/soap.  After you use shampoo/soap, rinse your hair and body thoroughly to remove shampoo/soap residue.  Turn the water OFF and apply about 3 tablespoons (45 ml) of CHG soap to a CLEAN washcloth.  Apply CHG soap ONLY FROM YOUR NECK DOWN TO YOUR TOES (washing for 3-5 minutes)  DO NOT use CHG soap on face, private areas, open wounds, or sores.  Pay special attention to the area where your surgery is being performed.  If you are having back surgery,  having someone wash your back for you may be helpful. Wait 2 minutes after CHG soap is applied, then you may rinse off the CHG soap.  Pat dry with a clean towel  Put on clean clothes/pajamas   If you choose to wear lotion, please use ONLY the CHG-compatible lotions on the back of this paper.     Additional instructions for the day of surgery: DO NOT APPLY any lotions, deodorants, cologne, or perfumes.   Put on clean/comfortable clothes.  Brush your teeth.  Ask your nurse before applying any prescription medications to the skin.      CHG Compatible Lotions   Aveeno Moisturizing lotion  Cetaphil Moisturizing Cream  Cetaphil Moisturizing Lotion  Clairol Herbal Essence Moisturizing Lotion, Dry Skin  Clairol Herbal Essence Moisturizing Lotion, Extra Dry Skin  Clairol Herbal Essence Moisturizing Lotion, Normal Skin  Curel Age Defying Therapeutic Moisturizing Lotion with Alpha Hydroxy  Curel Extreme Care Body Lotion  Curel Soothing Hands Moisturizing Hand Lotion  Curel Therapeutic Moisturizing Cream, Fragrance-Free  Curel Therapeutic Moisturizing Lotion, Fragrance-Free  Curel Therapeutic Moisturizing Lotion, Original Formula  Eucerin Daily Replenishing Lotion  Eucerin Dry Skin Therapy Plus Alpha Hydroxy Crme  Eucerin Dry Skin Therapy Plus Alpha Hydroxy Lotion  Eucerin Original Crme  Eucerin Original Lotion  Eucerin Plus Crme Eucerin Plus Lotion  Eucerin TriLipid Replenishing Lotion  Keri Anti-Bacterial Hand Lotion  Keri Deep Conditioning Original Lotion Dry Skin Formula Softly Scented  Keri Deep Conditioning Original Lotion, Fragrance Free Sensitive Skin Formula  Keri Lotion Fast Absorbing Fragrance Free Sensitive Skin Formula  Keri Lotion Fast Absorbing Softly Scented Dry Skin Formula  Keri Original Lotion  Keri Skin Renewal Lotion Keri Silky Smooth Lotion  Keri Silky Smooth Sensitive Skin Lotion  Nivea Body Creamy Conditioning Oil  Nivea Body Extra Enriched Lotion  Nivea  Body Original Lotion  Nivea Body Sheer Moisturizing Lotion Nivea Crme  Nivea Skin Firming Lotion  NutraDerm 30 Skin Lotion  NutraDerm Skin Lotion  NutraDerm Therapeutic Skin Cream  NutraDerm Therapeutic Skin Lotion  ProShield Protective Hand Cream  Provon moisturizing lotion View Pre-Surgery Education Videos:  [email or web address]     Incentive Spirometer  An incentive spirometer is a tool that can help keep your lungs clear and active. This tool measures how well you are filling your lungs with each breath. Taking long deep breaths may help reverse or decrease the chance of developing breathing (pulmonary) problems (especially infection) following: A long period of time when you are unable to move or be active. BEFORE THE PROCEDURE  If the spirometer includes an indicator to show your best effort, your nurse or respiratory therapist will set it to a desired goal. If possible, sit up straight or lean slightly forward. Try not to slouch. Hold the incentive spirometer in an upright position. INSTRUCTIONS FOR USE  Sit on the edge of your bed if possible, or sit up as far as you can in bed or on a chair. Hold the incentive spirometer in an upright position. Breathe out  normally. Place the mouthpiece in your mouth and seal your lips tightly around it. Breathe in slowly and as deeply as possible, raising the piston or the ball toward the top of the column. Hold your breath for 3-5 seconds or for as long as possible. Allow the piston or ball to fall to the bottom of the column. Remove the mouthpiece from your mouth and breathe out normally. Rest for a few seconds and repeat Steps 1 through 7 at least 10 times every 1-2 hours when you are awake. Take your time and take a few normal breaths between deep breaths. The spirometer may include an indicator to show your best effort. Use the indicator as a goal to work toward during each  repetition. After each set of 10 deep breaths, practice coughing to be sure your lungs are clear. If you have an incision (the cut made at the time of surgery), support your incision when coughing by placing a pillow or rolled up towels firmly against it. Once you are able to get out of bed, walk around indoors and cough well. You may stop using the incentive spirometer when instructed by your caregiver.  RISKS AND COMPLICATIONS Take your time so you do not get dizzy or light-headed. If you are in pain, you may need to take or ask for pain medication before doing incentive spirometry. It is harder to take a deep breath if you are having pain. AFTER USE Rest and breathe slowly and easily. It can be helpful to keep track of a log of your progress. Your caregiver can provide you with a simple table to help with this. If you are using the spirometer at home, follow these instructions: SEEK MEDICAL CARE IF:  You are having difficultly using the spirometer. You have trouble using the spirometer as often as instructed. Your pain medication is not giving enough relief while using the spirometer. You develop fever of 100.5 F (38.1 C) or higher. SEEK IMMEDIATE MEDICAL CARE IF:  You cough up bloody sputum that had not been present before. You develop fever of 102 F (38.9 C) or greater. You develop worsening pain at or near the incision site. MAKE SURE YOU:  Understand these instructions. Will watch your condition. Will get help right away if you are not doing well or get worse. Document Released: 09/06/2006 Document Revised: 07/19/2011 Document Reviewed: 11/07/2006 [facility] Patient Information 2014 [place], [place].

## 2023-07-15 ENCOUNTER — Encounter (HOSPITAL_COMMUNITY)
Admission: RE | Admit: 2023-07-15 | Discharge: 2023-07-15 | Disposition: A | Discharge status: Home / Self Care | Payer: No Typology Code available for payment source | Source: Ambulatory Visit | Attending: Orthopedic Surgery | Admitting: Orthopedic Surgery

## 2023-07-15 ENCOUNTER — Other Ambulatory Visit: Payer: Self-pay

## 2023-07-15 ENCOUNTER — Encounter (HOSPITAL_COMMUNITY): Payer: Self-pay

## 2023-07-15 VITALS — BP 140/108 | HR 87 | Temp 97.8°F | Resp 16 | Ht 60.0 in | Wt 156.0 lb

## 2023-07-15 DIAGNOSIS — Z01818 Encounter for other preprocedural examination: Secondary | ICD-10-CM | POA: Insufficient documentation

## 2023-07-15 DIAGNOSIS — I10 Essential (primary) hypertension: Secondary | ICD-10-CM | POA: Diagnosis not present

## 2023-07-15 HISTORY — DX: Diverticulitis of intestine, part unspecified, without perforation or abscess without bleeding: K57.92

## 2023-07-15 LAB — BASIC METABOLIC PANEL
Anion gap: 12 (ref 5–15)
BUN: 17 mg/dL (ref 8–23)
CO2: 26 mmol/L (ref 22–32)
Calcium: 9.3 mg/dL (ref 8.9–10.3)
Chloride: 102 mmol/L (ref 98–111)
Creatinine, Ser: 0.73 mg/dL (ref 0.44–1.00)
GFR, Estimated: >60 mL/min (ref >60)
Glucose, Bld: 102 mg/dL — ABNORMAL HIGH (ref 70–99)
Potassium: 4.5 mmol/L (ref 3.5–5.1)
Sodium: 140 mmol/L (ref 135–145)

## 2023-07-15 LAB — CBC
HCT: 44.6 % (ref 36.0–46.0)
Hemoglobin: 14.6 g/dL (ref 12.0–15.0)
MCH: 30.9 pg (ref 26.0–34.0)
MCHC: 32.7 g/dL (ref 30.0–36.0)
MCV: 94.3 fL (ref 80.0–100.0)
Platelets: 279 10*3/uL (ref 150–400)
RBC: 4.73 MIL/uL (ref 3.87–5.11)
RDW: 12.6 % (ref 11.5–15.5)
WBC: 8 10*3/uL (ref 4.0–10.5)
nRBC: 0 % (ref 0.0–0.2)

## 2023-07-15 LAB — NO BLOOD PRODUCTS

## 2023-07-15 LAB — SURGICAL PCR SCREEN
MRSA, PCR: NEGATIVE
Staphylococcus aureus: POSITIVE — AB

## 2023-07-15 NOTE — Progress Notes (Signed)
 [facility] results routed to DR. Aluisio

## 2023-07-21 NOTE — H&P (Addendum)
 H&P  Subjective:  HPI: [name], 68 y.o. female presents for pre-operative visit in preparation for their left total knee revision arthroplasty vs polyethylene revision, which is scheduled on 07/27/23 with Dr. Lequita Halt at [facility]. The patient has had symptoms in the left knee including pain and instability which has impacted their quality of life and ability to do activities of daily living. The patient currently has a diagnosis of unstable left total knee arthroplasty and has failed conservative treatments including activity modification. The patient denies an active infection.  Patient Active Problem List   Diagnosis Date Noted   Trochanteric bursitis of right hip 04/26/2022   Raynaud's phenomenon 04/12/2022   Diverticulitis of colon 12/01/2021   OA (osteoarthritis) of knee 09/08/2020   Abnormal Pap smear of cervix 11/03/2016   Glenohumeral arthritis, left 06/13/2016   Hypertension 09/24/2013   Asymptomatic postmenopausal status 07/20/2013   Hypothyroid 07/20/2013   Localized osteoarthrosis, hand 08/10/2011    Past Medical History:  Diagnosis Date   Anxiety    Arthritis    Asthma    Cancer (HCC)    skin   Cervical cancer (HCC)    Cholecystitis    Depression    Diverticulitis    GERD (gastroesophageal reflux disease)    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   History of hip replacement    Bilateral   Hypertension    Hypothyroidism    Menopause    Pancreatitis    PONV (postoperative nausea and vomiting)    Shingles    Small bowel obstruction due to adhesions (HCC)    Traumatic injury of back     Past Surgical History:  Procedure Laterality Date   ABDOMINAL ADHESION SURGERY  1972   ABDOMINAL HYSTERECTOMY     APPENDECTOMY  1995   AUGMENTATION MAMMAPLASTY     CERVICAL SPINE SURGERY     CESAREAN SECTION  2001   COLONOSCOPY     EXCISION/RELEASE BURSA HIP Right 04/26/2022   Procedure: Right hip bursectomy; iliotibial band repair vs partial resection;  Surgeon:  Ollen Gross, MD;  Location: [facility];  Service: Orthopedics;  Laterality: Right;    OOPHORECTOMY  1970's   left   SHOULDER SURGERY     THYROIDECTOMY  2003   TONSILLECTOMY     TOTAL HIP ARTHROPLASTY Bilateral    TOTAL KNEE ARTHROPLASTY Left 09/08/2020   Procedure: TOTAL KNEE ARTHROPLASTY;  Surgeon: Ollen Gross, MD;  Location: [facility];  Service: Orthopedics;  Laterality: Left;   TUBAL LIGATION      Prior to Admission medications   Medication Sig Start Date End Date Taking? Authorizing Provider  albuterol (VENTOLIN HFA) 108 (90 Base) MCG/ACT inhaler Inhale 2-6 puffs into the lungs as needed for wheezing or shortness of breath.   Yes [provider]  amoxicillin-clavulanate (AUGMENTIN) 875-125 MG tablet Take 1 tablet by mouth 2 (two) times daily.   Yes [provider]  Ascorbic Acid (VITAMIN C) 1000 MG tablet Take 1,000 mg by mouth daily.   Yes [provider]  baclofen (LIORESAL) 10 MG tablet Take 5 mg by mouth daily as needed for muscle spasms. 08/21/19  Yes [provider]  calcium citrate (CALCITRATE - DOSED IN MG ELEMENTAL CALCIUM) 950 (200 Ca) MG tablet Take 1 tablet by mouth 2 (two) times daily. 04/18/19  Yes [provider]  Cholecalciferol (VITAMIN D3) 1000 units CAPS Take 1,000 Units by mouth daily.   Yes [provider]  estradiol (VIVELLE-DOT) 0.05 MG/24HR patch Place 1 patch (  0.05 mg total) onto the skin 2 (two) times a week. 10/25/22  Yes Lesly Dukes, MD  Estradiol 10 MCG TABS vaginal tablet Place 10 mcg vaginally daily.   Yes [provider]  fluticasone (FLONASE) 50 MCG/ACT nasal spray Place 2 sprays into both nostrils as needed for allergies or rhinitis.   Yes [provider]  lisinopril (ZESTRIL) 10 MG tablet Take 10 mg by mouth in the morning and at bedtime.   Yes [provider]  Niacin (VITAMIN B-3 PO) Take 500 mg by mouth daily.   Yes [provider]  phentermine (ADIPEX-P)  37.5 MG tablet Take 37.5 mg by mouth daily before breakfast.   Yes [provider]  rOPINIRole (REQUIP) 1 MG tablet Take 2 mg by mouth at bedtime.   Yes [provider]  thyroid (NP THYROID) 15 MG tablet Take 15 mg by mouth daily.   Yes [provider]  thyroid (NP THYROID) 90 MG tablet Take 90 mg by mouth daily.   Yes [provider]  traMADol (ULTRAM) 50 MG tablet Take 1-2 tablets (50-100 mg total) by mouth every 6 (six) hours as needed for moderate pain. 04/26/22  Yes Eartha Inch, PA  valACYclovir (VALTREX) 500 MG tablet Take 500 mg by mouth daily.   Yes [provider]  vitamin B-12 (CYANOCOBALAMIN) 1000 MCG tablet Take 1,000 mcg by mouth daily.   Yes [provider]    Allergies  Allergen Reactions   Anesthetics, Ester Nausea And Vomiting   Lidocaine Nausea And Vomiting   Other Cough    Refuse blood or blood products   Oxycodone-Acetaminophen Nausea And Vomiting and Rash   Sulfa Antibiotics Hives   Sulfasalazine Hives and Rash   Amlodipine Swelling   Codeine Hives and Nausea And Vomiting   Hydrocodone Nausea And Vomiting   Ivp Dye [Iodinated Contrast Media] Hives   Lisinopril Itching    Can take split doses-takes 10 in am and 10 in evening   Oxycodone Nausea And Vomiting   Prednisone Swelling and Other (See Comments)    Causes her to have vaginal bleeding. Agitation    Ropinirole Nausea And Vomiting and Other (See Comments)    Gi upset     Social History   Socioeconomic History   Marital status: Divorced    Spouse name: Not on file   Number of children: 3   Years of education: Not on file   Highest education level: Not on file  Occupational History   Occupation: Estate agent rep    Employer: [employer]  Tobacco Use   Smoking status: Never   Smokeless tobacco: Never  Vaping Use   Vaping status: Never Used  Substance and Sexual Activity   Alcohol use: Not Currently   Drug use: No   Sexual  activity: Not Currently    Birth control/protection: None  Other Topics Concern   Not on file  Social History Narrative   Not on file   Social Drivers of Health   Financial Resource Strain: Low Risk  (09/23/2022)   Received from [facility]   Overall Financial Resource Strain (CARDIA)    Difficulty of Paying Living Expenses: Not very hard  Food Insecurity: No Food Insecurity (09/23/2022)   Received from [facility], [facility]   Hunger Vital Sign    Worried About Running Out of Food in the Last Year: Never true    Ran Out of Food in the Last Year: Never true  Transportation  Needs: No Transportation Needs (09/23/2022)   Received from [facility], [facility]   [facility] - Transportation    Lack of Transportation (Medical): No    Lack of Transportation (Non-Medical): No  Physical Activity: Not on file  Stress: Not on file  Social Connections: Unknown (09/20/2021)   Received from [facility], [facility]   Social Network    Social Network: Not on file  Intimate Partner Violence: Not At Risk (09/06/2022)   Received from [facility], [facility]   HITS    Over the last 12 months how often did your partner physically hurt you?: Never    Over the last 12 months how often did your partner insult you or talk down to you?: Never    Over the last 12 months how often did your partner threaten you with physical harm?: Never    Over the last 12 months how often did your partner scream or curse at you?: Never    Tobacco Use: Low Risk  (07/15/2023)   Patient History    Smoking Tobacco Use: Never    Smokeless Tobacco Use: Never    Passive Exposure: Not on file   Social History   Substance and Sexual Activity  Alcohol Use Not Currently    Family History  Problem Relation Age of Onset   Lung cancer Father    Colitis Father    Alcohol abuse Father    Stroke Mother    Colon cancer Neg Hx    Breast cancer Neg Hx     Review of Systems  Constitutional:   Negative for chills and fever.  HENT:  Negative for congestion, sore throat and tinnitus.   Eyes:  Negative for double vision, photophobia and pain.  Respiratory:  Negative for cough, shortness of breath and wheezing.   Cardiovascular:  Negative for chest pain, palpitations and orthopnea.  Gastrointestinal:  Negative for heartburn, nausea and vomiting.  Genitourinary:  Negative for dysuria, frequency and urgency.  Musculoskeletal:  Positive for joint pain.  Neurological:  Negative for dizziness, weakness and headaches.    Objective:  Physical Exam: Well nourished and well developed.  General: Alert and oriented x3, cooperative and pleasant, no acute distress.  Head: normocephalic, atraumatic, neck supple.  Eyes: EOMI.  Musculoskeletal:   Left knee shows a moderate effusion. - Range of motion approximately 0 to 120 degrees. - Significant anterior-posterior laxity in flexion and varus-valgus laxity in extension, none of which was present prior to her accident. - Antalgic gait pattern on the left.  Calves soft and nontender. Motor function intact in LE. Strength 5/5 LE bilaterally. Neuro: Distal pulses 2+. Sensation to light touch intact in LE.  Imaging Review IMAGING: - Bone scan demonstrates increased uptake around the patella and general hyperemia in the joint. - No major increased uptake around the tibial or femoral components.  Assessment/Plan:  Unstable left total knee arthroplasty  Risks and benefits were discussed with the patient at length by Dr. Lequita Halt  Therapy Plans: Outpatient therapy at [facility] Disposition: Home with daughter for first 3 days Planned DVT Prophylaxis: Aspirin 81 mg BID DME Needed: None PCP: Dr. Clifton Custard (clearance received) TXA: IV Allergies: Morphine, codeine, prednisone Metal Allergy: None Anesthesia Concerns: N/V BMI: 30.9 Last HgbA1c: Not diabetic Pain Regimen: Dilaudid, tramadol [facility]acy: [facility] [address])   - Patient  was instructed on what medications to stop prior to surgery. - Follow-up visit in 2 weeks with Dr. Lequita Halt - Begin physical therapy following surgery -  Pre-operative lab work as pre-surgical testing - Prescriptions will be provided in hospital at time of discharge  Arther Abbott, PA-C Orthopedic Surgery [facility] [place]

## 2023-07-26 NOTE — Anesthesia Preprocedure Evaluation (Addendum)
 Anesthesia Evaluation  Patient identified by MRN, date of birth, ID band Patient awake    Reviewed: Allergy & Precautions, NPO status , Patient's Chart, lab work & pertinent test results, reviewed documented beta blocker date and time   History of Anesthesia Complications (+) PONV and history of anesthetic complications  Airway Mallampati: II       Dental no notable dental hx. (+) Teeth Intact, Caps, Dental Advisory Given   Pulmonary asthma    Pulmonary exam normal breath sounds clear to auscultation       Cardiovascular hypertension, Pt. on medications Normal cardiovascular exam Rhythm:Regular Rate:Normal     Neuro/Psych  PSYCHIATRIC DISORDERS Anxiety Depression    negative neurological ROS     GI/Hepatic Neg liver ROS,GERD  ,,  Endo/Other  Hypothyroidism    Renal/GU   negative genitourinary   Musculoskeletal  (+) Arthritis , Osteoarthritis,  Unstable left knee arthroplasty Levo scoliosis lumbar spine   Abdominal   Peds negative pediatric ROS (+)  Hematology negative hematology ROS (+) Lab Results      Component                Value               Date                            HGB                      14.2                04/14/2022                HCT                      43.5                04/14/2022                PLT                      269                 04/14/2022              Anesthesia Other Findings   Reproductive/Obstetrics negative OB ROS                             Anesthesia Physical Anesthesia Plan  ASA: 3  Anesthesia Plan: Spinal   Post-op Pain Management: Regional block* and Minimal or no pain anticipated   Induction: Intravenous  PONV Risk Score and Plan: 4 or greater and Propofol infusion  Airway Management Planned: Natural Airway, Simple Face Mask and Nasal Cannula  Additional Equipment: None  Intra-op Plan:   Post-operative Plan:   Informed  Consent: I have reviewed the patients History and Physical, chart, labs and discussed the procedure including the risks, benefits and alternatives for the proposed anesthesia with the patient or authorized representative who has indicated his/her understanding and acceptance.     Dental advisory given  Plan Discussed with: CRNA and Anesthesiologist  Anesthesia Plan Comments: (GA TIVA)        Anesthesia Quick Evaluation

## 2023-07-27 ENCOUNTER — Inpatient Hospital Stay (HOSPITAL_COMMUNITY)
Admission: RE | Admit: 2023-07-27 | Discharge: 2023-07-29 | DRG: 468 | Disposition: A | Discharge status: Home Health | Payer: No Typology Code available for payment source | Attending: Orthopedic Surgery | Admitting: Orthopedic Surgery

## 2023-07-27 ENCOUNTER — Inpatient Hospital Stay (HOSPITAL_COMMUNITY): Admitting: Certified Registered Nurse Anesthetist

## 2023-07-27 ENCOUNTER — Encounter (HOSPITAL_COMMUNITY): Payer: Self-pay | Admitting: Orthopedic Surgery

## 2023-07-27 ENCOUNTER — Encounter (HOSPITAL_COMMUNITY)
Admission: RE | Disposition: A | Discharge status: Home Health | Payer: Self-pay | Source: Home / Self Care | Attending: Orthopedic Surgery

## 2023-07-27 ENCOUNTER — Other Ambulatory Visit: Payer: Self-pay

## 2023-07-27 DIAGNOSIS — K219 Gastro-esophageal reflux disease without esophagitis: Secondary | ICD-10-CM | POA: Diagnosis present

## 2023-07-27 DIAGNOSIS — Z888 Allergy status to other drugs, medicaments and biological substances status: Secondary | ICD-10-CM

## 2023-07-27 DIAGNOSIS — E669 Obesity, unspecified: Secondary | ICD-10-CM | POA: Diagnosis present

## 2023-07-27 DIAGNOSIS — Z823 Family history of stroke: Secondary | ICD-10-CM | POA: Diagnosis not present

## 2023-07-27 DIAGNOSIS — T84033A Mechanical loosening of internal left knee prosthetic joint, initial encounter: Secondary | ICD-10-CM | POA: Diagnosis present

## 2023-07-27 DIAGNOSIS — T84023A Instability of internal left knee prosthesis, initial encounter: Secondary | ICD-10-CM

## 2023-07-27 DIAGNOSIS — Z882 Allergy status to sulfonamides status: Secondary | ICD-10-CM

## 2023-07-27 DIAGNOSIS — F419 Anxiety disorder, unspecified: Secondary | ICD-10-CM | POA: Diagnosis present

## 2023-07-27 DIAGNOSIS — R110 Nausea: Secondary | ICD-10-CM | POA: Diagnosis not present

## 2023-07-27 DIAGNOSIS — Z6830 Body mass index (BMI) 30.0-30.9, adult: Secondary | ICD-10-CM | POA: Diagnosis not present

## 2023-07-27 DIAGNOSIS — Z881 Allergy status to other antibiotic agents status: Secondary | ICD-10-CM

## 2023-07-27 DIAGNOSIS — Z885 Allergy status to narcotic agent status: Secondary | ICD-10-CM | POA: Diagnosis not present

## 2023-07-27 DIAGNOSIS — I7300 Raynaud's syndrome without gangrene: Secondary | ICD-10-CM | POA: Diagnosis present

## 2023-07-27 DIAGNOSIS — E890 Postprocedural hypothyroidism: Secondary | ICD-10-CM | POA: Diagnosis present

## 2023-07-27 DIAGNOSIS — Z90710 Acquired absence of both cervix and uterus: Secondary | ICD-10-CM

## 2023-07-27 DIAGNOSIS — Z8541 Personal history of malignant neoplasm of cervix uteri: Secondary | ICD-10-CM

## 2023-07-27 DIAGNOSIS — Z801 Family history of malignant neoplasm of trachea, bronchus and lung: Secondary | ICD-10-CM | POA: Diagnosis not present

## 2023-07-27 DIAGNOSIS — Z96643 Presence of artificial hip joint, bilateral: Secondary | ICD-10-CM | POA: Diagnosis present

## 2023-07-27 DIAGNOSIS — Z79899 Other long term (current) drug therapy: Secondary | ICD-10-CM

## 2023-07-27 DIAGNOSIS — Z96659 Presence of unspecified artificial knee joint: Principal | ICD-10-CM

## 2023-07-27 DIAGNOSIS — Z811 Family history of alcohol abuse and dependence: Secondary | ICD-10-CM

## 2023-07-27 DIAGNOSIS — Y792 Prosthetic and other implants, materials and accessory orthopedic devices associated with adverse incidents: Secondary | ICD-10-CM | POA: Diagnosis present

## 2023-07-27 DIAGNOSIS — Z85828 Personal history of other malignant neoplasm of skin: Secondary | ICD-10-CM | POA: Diagnosis not present

## 2023-07-27 DIAGNOSIS — E039 Hypothyroidism, unspecified: Secondary | ICD-10-CM | POA: Diagnosis not present

## 2023-07-27 DIAGNOSIS — I10 Essential (primary) hypertension: Secondary | ICD-10-CM | POA: Diagnosis present

## 2023-07-27 DIAGNOSIS — Z90721 Acquired absence of ovaries, unilateral: Secondary | ICD-10-CM

## 2023-07-27 DIAGNOSIS — J45909 Unspecified asthma, uncomplicated: Secondary | ICD-10-CM

## 2023-07-27 DIAGNOSIS — F32A Depression, unspecified: Secondary | ICD-10-CM | POA: Diagnosis present

## 2023-07-27 DIAGNOSIS — M419 Scoliosis, unspecified: Secondary | ICD-10-CM | POA: Diagnosis present

## 2023-07-27 DIAGNOSIS — T84018A Broken internal joint prosthesis, other site, initial encounter: Secondary | ICD-10-CM

## 2023-07-27 HISTORY — PX: TOTAL KNEE REVISION: SHX996

## 2023-07-27 SURGERY — TOTAL KNEE REVISION
Anesthesia: Spinal | Site: Knee | Laterality: Left

## 2023-07-27 MED ORDER — LACTATED RINGERS IV SOLN: INTRAVENOUS | Status: DC

## 2023-07-27 MED ORDER — METHOCARBAMOL 500 MG PO TABS
500.0000 mg | ORAL_TABLET | Freq: Four times a day (QID) | ORAL | Status: DC | PRN
  Administered 2023-07-27 – 2023-07-29 (×5): 500 mg via ORAL
  Filled 2023-07-27 (×5): qty 1

## 2023-07-27 MED ORDER — ONDANSETRON HCL 4 MG/2ML IJ SOLN: 4.0000 mg | Freq: Four times a day (QID) | INTRAMUSCULAR | Status: DC | PRN

## 2023-07-27 MED ORDER — ACETAMINOPHEN 10 MG/ML IV SOLN
1000.0000 mg | Freq: Once | INTRAVENOUS | Status: AC
  Administered 2023-07-27: 1000 mg via INTRAVENOUS
  Filled 2023-07-27: qty 100

## 2023-07-27 MED ORDER — DIPHENHYDRAMINE HCL 50 MG/ML IJ SOLN
INTRAMUSCULAR | Status: AC
  Filled 2023-07-27: qty 1

## 2023-07-27 MED ORDER — SODIUM CHLORIDE (PF) 0.9 % IJ SOLN
INTRAMUSCULAR | Status: AC
  Filled 2023-07-27: qty 50

## 2023-07-27 MED ORDER — THYROID 60 MG PO TABS
90.0000 mg | ORAL_TABLET | Freq: Every day | ORAL | Status: DC
  Administered 2023-07-28 – 2023-07-29 (×2): 90 mg via ORAL
  Filled 2023-07-27 (×2): qty 1

## 2023-07-27 MED ORDER — METHOCARBAMOL 1000 MG/10ML IJ SOLN: 500.0000 mg | Freq: Four times a day (QID) | INTRAMUSCULAR | Status: DC | PRN

## 2023-07-27 MED ORDER — VANCOMYCIN HCL 1000 MG IV SOLR
INTRAVENOUS | Status: AC
  Filled 2023-07-27: qty 20

## 2023-07-27 MED ORDER — PROPOFOL 1000 MG/100ML IV EMUL
INTRAVENOUS | Status: AC
  Filled 2023-07-27: qty 100

## 2023-07-27 MED ORDER — BUPIVACAINE IN DEXTROSE 0.75-8.25 % IT SOLN
INTRATHECAL | Status: DC | PRN
  Administered 2023-07-27: 1.6 mL via INTRATHECAL

## 2023-07-27 MED ORDER — BUPIVACAINE LIPOSOME 1.3 % IJ SUSP
INTRAMUSCULAR | Status: AC
  Filled 2023-07-27: qty 20

## 2023-07-27 MED ORDER — PHENYLEPHRINE HCL-NACL 20-0.9 MG/250ML-% IV SOLN
INTRAVENOUS | Status: DC | PRN
  Administered 2023-07-27: 80 ug via INTRAVENOUS
  Administered 2023-07-27: 40 ug/min via INTRAVENOUS
  Administered 2023-07-27: 160 ug via INTRAVENOUS
  Administered 2023-07-27 (×3): 80 ug via INTRAVENOUS

## 2023-07-27 MED ORDER — ONDANSETRON HCL 4 MG PO TABS
4.0000 mg | ORAL_TABLET | Freq: Four times a day (QID) | ORAL | Status: DC | PRN
  Administered 2023-07-28: 4 mg via ORAL
  Filled 2023-07-27: qty 1

## 2023-07-27 MED ORDER — DROPERIDOL 2.5 MG/ML IJ SOLN: 0.6250 mg | Freq: Once | INTRAMUSCULAR | Status: DC | PRN

## 2023-07-27 MED ORDER — ROPIVACAINE HCL 5 MG/ML IJ SOLN
INTRAMUSCULAR | Status: DC | PRN
  Administered 2023-07-27: 30 mL via PERINEURAL

## 2023-07-27 MED ORDER — CHLORHEXIDINE GLUCONATE 0.12 % MT SOLN
15.0000 mL | Freq: Once | OROMUCOSAL | Status: AC
  Administered 2023-07-27: 15 mL via OROMUCOSAL

## 2023-07-27 MED ORDER — ALBUTEROL SULFATE (2.5 MG/3ML) 0.083% IN NEBU: 2.5000 mg | INHALATION_SOLUTION | RESPIRATORY_TRACT | Status: DC | PRN

## 2023-07-27 MED ORDER — HYDROMORPHONE HCL 1 MG/ML IJ SOLN
0.5000 mg | INTRAMUSCULAR | Status: DC | PRN
  Administered 2023-07-27 – 2023-07-29 (×8): 1 mg via INTRAVENOUS
  Filled 2023-07-27 (×8): qty 1

## 2023-07-27 MED ORDER — PHENTERMINE HCL 37.5 MG PO TABS
37.5000 mg | ORAL_TABLET | Freq: Every day | ORAL | Status: DC
Start: 2023-07-28 — End: 2023-07-27

## 2023-07-27 MED ORDER — ONDANSETRON HCL 4 MG/2ML IJ SOLN: 4.0000 mg | Freq: Once | INTRAMUSCULAR | Status: DC | PRN

## 2023-07-27 MED ORDER — MUPIROCIN 2 % EX OINT: 1.0000 | TOPICAL_OINTMENT | Freq: Two times a day (BID) | CUTANEOUS | 0 refills | Status: DC

## 2023-07-27 MED ORDER — ORAL CARE MOUTH RINSE: 15.0000 mL | Freq: Once | OROMUCOSAL | Status: AC

## 2023-07-27 MED ORDER — DEXMEDETOMIDINE HCL IN NACL 80 MCG/20ML IV SOLN
INTRAVENOUS | Status: DC | PRN
  Administered 2023-07-27: 4 ug via INTRAVENOUS

## 2023-07-27 MED ORDER — FLEET ENEMA RE ENEM: 1.0000 | ENEMA | Freq: Once | RECTAL | Status: DC | PRN

## 2023-07-27 MED ORDER — DEXMEDETOMIDINE HCL IN NACL 80 MCG/20ML IV SOLN
INTRAVENOUS | Status: AC
  Filled 2023-07-27: qty 20

## 2023-07-27 MED ORDER — SODIUM CHLORIDE 0.9 % IV SOLN: INTRAVENOUS | Status: DC

## 2023-07-27 MED ORDER — POVIDONE-IODINE 10 % EX SWAB: 2.0000 | Freq: Once | CUTANEOUS | Status: DC

## 2023-07-27 MED ORDER — ONDANSETRON HCL 4 MG/2ML IJ SOLN
INTRAMUSCULAR | Status: DC | PRN
  Administered 2023-07-27: 4 mg via INTRAVENOUS

## 2023-07-27 MED ORDER — MIDAZOLAM HCL 2 MG/2ML IJ SOLN
1.0000 mg | INTRAMUSCULAR | Status: DC
  Administered 2023-07-27: 2 mg via INTRAVENOUS
  Filled 2023-07-27: qty 2

## 2023-07-27 MED ORDER — HYDROMORPHONE HCL 2 MG PO TABS
2.0000 mg | ORAL_TABLET | ORAL | Status: DC | PRN
  Administered 2023-07-27 – 2023-07-28 (×2): 4 mg via ORAL
  Administered 2023-07-28 – 2023-07-29 (×3): 2 mg via ORAL
  Filled 2023-07-27: qty 2
  Filled 2023-07-27: qty 1
  Filled 2023-07-27 (×2): qty 2
  Filled 2023-07-27: qty 1
  Filled 2023-07-27 (×2): qty 2

## 2023-07-27 MED ORDER — METOCLOPRAMIDE HCL 5 MG/ML IJ SOLN
5.0000 mg | Freq: Three times a day (TID) | INTRAMUSCULAR | Status: DC | PRN
  Administered 2023-07-28: 10 mg via INTRAVENOUS
  Filled 2023-07-27: qty 2

## 2023-07-27 MED ORDER — METOCLOPRAMIDE HCL 5 MG PO TABS: 5.0000 mg | ORAL_TABLET | Freq: Three times a day (TID) | ORAL | Status: DC | PRN

## 2023-07-27 MED ORDER — ONDANSETRON HCL 4 MG/2ML IJ SOLN
INTRAMUSCULAR | Status: AC
  Filled 2023-07-27: qty 2

## 2023-07-27 MED ORDER — DEXAMETHASONE SODIUM PHOSPHATE 10 MG/ML IJ SOLN
10.0000 mg | Freq: Once | INTRAMUSCULAR | Status: AC
  Administered 2023-07-28: 10 mg via INTRAVENOUS
  Filled 2023-07-27: qty 1

## 2023-07-27 MED ORDER — CEFAZOLIN SODIUM-DEXTROSE 2-4 GM/100ML-% IV SOLN
2.0000 g | Freq: Four times a day (QID) | INTRAVENOUS | Status: AC
  Administered 2023-07-27 (×2): 2 g via INTRAVENOUS
  Filled 2023-07-27 (×2): qty 100

## 2023-07-27 MED ORDER — TRANEXAMIC ACID-NACL 1000-0.7 MG/100ML-% IV SOLN
1000.0000 mg | INTRAVENOUS | Status: AC
  Administered 2023-07-27: 1000 mg via INTRAVENOUS
  Filled 2023-07-27: qty 100

## 2023-07-27 MED ORDER — CHLORHEXIDINE GLUCONATE 4 % EX SOLN: 1.0000 | CUTANEOUS | 1 refills | Status: DC

## 2023-07-27 MED ORDER — POLYETHYLENE GLYCOL 3350 17 G PO PACK
17.0000 g | PACK | Freq: Every day | ORAL | Status: DC | PRN
  Administered 2023-07-28: 17 g via ORAL
  Filled 2023-07-27: qty 1

## 2023-07-27 MED ORDER — DEXAMETHASONE SODIUM PHOSPHATE 10 MG/ML IJ SOLN
INTRAMUSCULAR | Status: AC
  Filled 2023-07-27: qty 1

## 2023-07-27 MED ORDER — HYDROMORPHONE HCL 1 MG/ML IJ SOLN: 0.2500 mg | INTRAMUSCULAR | Status: DC | PRN

## 2023-07-27 MED ORDER — SODIUM CHLORIDE 0.9 % IR SOLN
Status: DC | PRN
  Administered 2023-07-27: 3000 mL

## 2023-07-27 MED ORDER — DEXAMETHASONE SODIUM PHOSPHATE 10 MG/ML IJ SOLN
8.0000 mg | Freq: Once | INTRAMUSCULAR | Status: AC
  Administered 2023-07-27: 10 mg via INTRAVENOUS

## 2023-07-27 MED ORDER — SODIUM CHLORIDE 0.9 % IV SOLN
INTRAVENOUS | Status: DC | PRN
  Administered 2023-07-27: 80 mL

## 2023-07-27 MED ORDER — PHENOL 1.4 % MT LIQD: 1.0000 | OROMUCOSAL | Status: DC | PRN

## 2023-07-27 MED ORDER — VALACYCLOVIR HCL 500 MG PO TABS
500.0000 mg | ORAL_TABLET | Freq: Every day | ORAL | Status: DC
  Administered 2023-07-28 – 2023-07-29 (×2): 500 mg via ORAL
  Filled 2023-07-27 (×2): qty 1

## 2023-07-27 MED ORDER — ROPINIROLE HCL 1 MG PO TABS
2.0000 mg | ORAL_TABLET | Freq: Every day | ORAL | Status: DC
  Administered 2023-07-27 – 2023-07-28 (×2): 2 mg via ORAL
  Filled 2023-07-27 (×2): qty 2

## 2023-07-27 MED ORDER — FENTANYL CITRATE PF 50 MCG/ML IJ SOSY
50.0000 ug | PREFILLED_SYRINGE | INTRAMUSCULAR | Status: DC
  Administered 2023-07-27: 50 ug via INTRAVENOUS
  Filled 2023-07-27: qty 2

## 2023-07-27 MED ORDER — TRAMADOL HCL 50 MG PO TABS
50.0000 mg | ORAL_TABLET | Freq: Four times a day (QID) | ORAL | Status: DC | PRN
  Administered 2023-07-27 – 2023-07-28 (×3): 100 mg via ORAL
  Administered 2023-07-29: 50 mg via ORAL
  Filled 2023-07-27 (×3): qty 2
  Filled 2023-07-27: qty 1
  Filled 2023-07-27: qty 2

## 2023-07-27 MED ORDER — PROPOFOL 10 MG/ML IV BOLUS
INTRAVENOUS | Status: DC | PRN
  Administered 2023-07-27: 10 mg via INTRAVENOUS
  Administered 2023-07-27: 20 mg via INTRAVENOUS

## 2023-07-27 MED ORDER — BUPIVACAINE LIPOSOME 1.3 % IJ SUSP: 20.0000 mL | Freq: Once | INTRAMUSCULAR | Status: DC

## 2023-07-27 MED ORDER — ACETAMINOPHEN 500 MG PO TABS
1000.0000 mg | ORAL_TABLET | Freq: Four times a day (QID) | ORAL | Status: AC
  Administered 2023-07-27 – 2023-07-28 (×4): 1000 mg via ORAL
  Filled 2023-07-27 (×4): qty 2

## 2023-07-27 MED ORDER — PROPOFOL 500 MG/50ML IV EMUL
INTRAVENOUS | Status: DC | PRN
  Administered 2023-07-27: 50 ug/kg/min via INTRAVENOUS

## 2023-07-27 MED ORDER — THYROID 30 MG PO TABS
15.0000 mg | ORAL_TABLET | Freq: Every day | ORAL | Status: DC
  Administered 2023-07-28 – 2023-07-29 (×2): 15 mg via ORAL
  Filled 2023-07-27 (×2): qty 1

## 2023-07-27 MED ORDER — CEFAZOLIN SODIUM-DEXTROSE 2-4 GM/100ML-% IV SOLN
2.0000 g | INTRAVENOUS | Status: AC
  Administered 2023-07-27: 2 g via INTRAVENOUS
  Filled 2023-07-27: qty 100

## 2023-07-27 MED ORDER — ASPIRIN 81 MG PO CHEW
81.0000 mg | CHEWABLE_TABLET | Freq: Two times a day (BID) | ORAL | Status: DC
  Administered 2023-07-28 – 2023-07-29 (×3): 81 mg via ORAL
  Filled 2023-07-27 (×3): qty 1

## 2023-07-27 MED ORDER — DOCUSATE SODIUM 100 MG PO CAPS
100.0000 mg | ORAL_CAPSULE | Freq: Two times a day (BID) | ORAL | Status: DC
  Administered 2023-07-27 – 2023-07-29 (×4): 100 mg via ORAL
  Filled 2023-07-27 (×4): qty 1

## 2023-07-27 MED ORDER — FLUTICASONE PROPIONATE 50 MCG/ACT NA SUSP: 2.0000 | NASAL | Status: DC | PRN

## 2023-07-27 MED ORDER — ACETAMINOPHEN 325 MG PO TABS: 325.0000 mg | ORAL_TABLET | Freq: Four times a day (QID) | ORAL | Status: DC | PRN

## 2023-07-27 MED ORDER — MENTHOL 3 MG MT LOZG: 1.0000 | LOZENGE | OROMUCOSAL | Status: DC | PRN

## 2023-07-27 MED ORDER — BISACODYL 10 MG RE SUPP
10.0000 mg | Freq: Every day | RECTAL | Status: DC | PRN
  Administered 2023-07-29: 10 mg via RECTAL
  Filled 2023-07-27: qty 1

## 2023-07-27 MED ORDER — DIPHENHYDRAMINE HCL 12.5 MG/5ML PO ELIX: 12.5000 mg | ORAL_SOLUTION | ORAL | Status: DC | PRN

## 2023-07-27 MED ORDER — 0.9 % SODIUM CHLORIDE (POUR BTL) OPTIME
TOPICAL | Status: DC | PRN
  Administered 2023-07-27: 1000 mL

## 2023-07-27 SURGICAL SUPPLY — 58 items
AUG TIB ATTUNE UNV SZ3 4X5 (Joint) ×2 IMPLANT
AUGMENT TIB ATTUNE UNV SZ3 4X5 (Joint) IMPLANT
BAG COUNTER SPONGE SURGICOUNT (BAG) IMPLANT
BAG ZIPLOCK 12X15 (MISCELLANEOUS) IMPLANT
BLADE SAG 18X100X1.27 (BLADE) IMPLANT
BLADE SAW SGTL 11.0X1.19X90.0M (BLADE) IMPLANT
BLADE SURG SZ10 CARB STEEL (BLADE) IMPLANT
BNDG ELASTIC 6INX 5YD STR LF (GAUZE/BANDAGES/DRESSINGS) ×1 IMPLANT
BONE CEMENT GENTAMICIN (Cement) ×2 IMPLANT
BOWL SMART MIX CTS (DISPOSABLE) IMPLANT
CEMENT BONE GENTAMICIN (Cement) ×2 IMPLANT
CEMENT BONE GENTAMICIN 40 (Cement) IMPLANT
COMP FEM ATTUNE CRS SZ4 LT (Femur) ×1 IMPLANT
COMPONENT FEM ATN CR SZ4 LT (Femur) IMPLANT
COVER SURGICAL LIGHT HANDLE (MISCELLANEOUS) ×1 IMPLANT
CUFF TRNQT CYL 34X4.125X (TOURNIQUET CUFF) ×1 IMPLANT
DERMABOND ADVANCED .7 DNX12 (GAUZE/BANDAGES/DRESSINGS) IMPLANT
DRAPE U-SHAPE 47X51 STRL (DRAPES) ×1 IMPLANT
DRSG AQUACEL AG ADV 3.5X10 (GAUZE/BANDAGES/DRESSINGS) ×1 IMPLANT
DURAPREP 26ML APPLICATOR (WOUND CARE) ×1 IMPLANT
ELECT REM PT RETURN 15FT ADLT (MISCELLANEOUS) ×1 IMPLANT
GLOVE BIO SURGEON STRL SZ 6.5 (GLOVE) IMPLANT
GLOVE BIO SURGEON STRL SZ7 (GLOVE) IMPLANT
GLOVE BIO SURGEON STRL SZ8 (GLOVE) ×1 IMPLANT
GLOVE BIOGEL PI IND STRL 6.5 (GLOVE) IMPLANT
GLOVE BIOGEL PI IND STRL 7.0 (GLOVE) IMPLANT
GLOVE BIOGEL PI IND STRL 8 (GLOVE) ×1 IMPLANT
GOWN STRL REUS W/ TWL LRG LVL3 (GOWN DISPOSABLE) ×2 IMPLANT
HOLDER FOLEY CATH W/STRAP (MISCELLANEOUS) IMPLANT
IMMOBILIZER KNEE 20 (SOFTGOODS) ×1 IMPLANT
IMMOBILIZER KNEE 20 THIGH 36 (SOFTGOODS) ×1 IMPLANT
INSERT TIB CMT ATTUNE RP SZ3 (Knees) IMPLANT
INSERT TIB CRS ATTUNE SZ4 16 (Insert) IMPLANT
KIT TURNOVER KIT A (KITS) IMPLANT
MANIFOLD NEPTUNE II (INSTRUMENTS) ×1 IMPLANT
NS IRRIG 1000ML POUR BTL (IV SOLUTION) ×1 IMPLANT
PACK TOTAL KNEE CUSTOM (KITS) ×1 IMPLANT
PADDING CAST COTTON 6X4 STRL (CAST SUPPLIES) ×2 IMPLANT
PIN STEINMAN FIXATION KNEE (PIN) IMPLANT
PROTECTOR NERVE ULNAR (MISCELLANEOUS) ×1 IMPLANT
SET HNDPC FAN SPRY TIP SCT (DISPOSABLE) ×1 IMPLANT
SLEEVE KNEE ATTUNE 29MM (Knees) IMPLANT
SPIKE FLUID TRANSFER (MISCELLANEOUS) IMPLANT
STEM STR ATTUNE PF 14X110 (Knees) IMPLANT
STEM STR ATTUNE PF 14X60 (Knees) IMPLANT
SUT MNCRL AB 4-0 PS2 18 (SUTURE) ×1 IMPLANT
SUT STRATAFIX 0 PDS 27 VIOLET (SUTURE) ×1 IMPLANT
SUT VIC AB 2-0 CT1 TAPERPNT 27 (SUTURE) ×3 IMPLANT
SUTURE STRATFX 0 PDS 27 VIOLET (SUTURE) ×1 IMPLANT
SWAB COLLECTION DEVICE MRSA (MISCELLANEOUS) IMPLANT
SWAB CULTURE ESWAB REG 1ML (MISCELLANEOUS) IMPLANT
TOWER CARTRIDGE SMART MIX (DISPOSABLE) IMPLANT
TRAY FOLEY MTR SLVR 14FR STAT (SET/KITS/TRAYS/PACK) IMPLANT
TRAY FOLEY MTR SLVR 16FR STAT (SET/KITS/TRAYS/PACK) ×1 IMPLANT
TUBE KAMVAC SUCTION (TUBING) IMPLANT
TUBE SUCTION HIGH CAP CLEAR NV (SUCTIONS) ×1 IMPLANT
WATER STERILE IRR 1000ML POUR (IV SOLUTION) ×1 IMPLANT
WRAP KNEE MAXI GEL POST OP (GAUZE/BANDAGES/DRESSINGS) IMPLANT

## 2023-07-27 NOTE — Progress Notes (Signed)
 Orthopedic Tech Progress Note Patient Details:  [name] 1955/11/18 [id number]  CPM Left Knee CPM Left Knee: Off Left Knee Flexion (Degrees): 40 Left Knee Extension (Degrees): 10  Post Interventions Patient Tolerated: Fair Instructions Provided: Adjustment of device, Care of device  Kizzie Fantasia 07/27/2023, 4:58 PM

## 2023-07-27 NOTE — Anesthesia Procedure Notes (Signed)
 Procedure Name: MAC Date/Time: 07/27/2023 9:27 AM  Performed by: Ludwig Lean, CRNAPre-anesthesia Checklist: Patient identified, Emergency Drugs available, Suction available and Patient being monitored Patient Re-evaluated:Patient Re-evaluated prior to induction Oxygen Delivery Method: Simple face mask Placement Confirmation: positive ETCO2 and breath sounds checked- equal and bilateral Dental Injury: Teeth and Oropharynx as per pre-operative assessment

## 2023-07-27 NOTE — Discharge Instructions (Signed)
 Ollen Gross, MD Total Joint Specialist [facility] [place] [address] [place], [place] [address] [phone]  TOTAL KNEE REVISION POSTOPERATIVE DIRECTIONS  Knee Rehabilitation, Guidelines Following Surgery  Results after knee surgery are often greatly improved when you follow the exercise, range of motion and muscle strengthening exercises prescribed by your doctor. Safety measures are also important to protect the knee from further injury. If any of these exercises cause you to have increased pain or swelling in your knee joint, decrease the amount until you are comfortable again and slowly increase them. If you have problems or questions, call your caregiver or physical therapist for advice.   BLOOD CLOT PREVENTION Take an 81 mg Aspirin two times a day for three weeks following surgery. Then take an 81 mg Aspirin once a day for three weeks. Then discontinue Aspirin. You may resume your vitamins/supplements upon discharge from the hospital. Do not take any NSAIDs (Advil, Aleve, Ibuprofen, Meloxicam, etc.) until you have discontinued the 81 mg Aspirin twice a day.  HOME CARE INSTRUCTIONS  Remove items at home which could result in a fall. This includes throw rugs or furniture in walking pathways.  ICE to the affected knee as much as tolerated. Icing helps control swelling. If the swelling is well controlled you will be more comfortable and rehab easier. Continue to use ice on the knee for pain and swelling from surgery. You may notice swelling that will progress down to the foot and ankle. This is normal after surgery. Elevate the leg when you are not up walking on it.    Continue to use the breathing machine which will help keep your temperature down. It is common for your temperature to cycle up and down following surgery, especially at night when you are not up moving around and exerting yourself. The breathing machine keeps your lungs expanded and your  temperature down. Do not place pillow under the operative knee, focus on keeping the knee straight while resting  DIET You may resume your previous home diet once you are discharged from the hospital.  DRESSING / WOUND CARE / SHOWERING Keep your bulky bandage on for 2 days. On the third post-operative day you may remove the Ace bandage and gauze. There is a waterproof adhesive bandage on your skin which will stay in place until your first follow-up appointment. Once you remove this you will not need to place another bandage You may begin showering 3 days following surgery, but do not submerge the incision under water.  ACTIVITY For the first 5 days, the key is rest and control of pain and swelling Do your home exercises twice a day starting on post-operative day 3. On the days you go to physical therapy, just do the home exercises once that day. You should rest, ice and elevate the leg for 50 minutes out of every hour. Get up and walk/stretch for 10 minutes per hour. After 5 days you can increase your activity slowly as tolerated. Walk with your walker as instructed. Use the walker until you are comfortable transitioning to a cane. Walk with the cane in the opposite hand of the operative leg. You may discontinue the cane once you are comfortable and walking steadily. Avoid periods of inactivity such as sitting longer than an hour when not asleep. This helps prevent blood clots.  You may discontinue the knee immobilizer once you are able to perform a straight leg raise while lying down. You may resume a sexual relationship in one  month or when given the OK by your doctor.  You may return to work once you are cleared by your doctor.  Do not drive a car for 6 weeks or until released by your surgeon.  Do not drive while taking narcotics.  TED HOSE STOCKINGS Wear the elastic stockings on both legs for three weeks following surgery during the day. You may remove them at night for sleeping.  WEIGHT  BEARING Weight bearing as tolerated with assist device (walker, cane, etc) as directed, use it as long as suggested by your surgeon or therapist, typically at least 4-6 weeks.  POSTOPERATIVE CONSTIPATION PROTOCOL Constipation - defined medically as fewer than three stools per week and severe constipation as less than one stool per week.  One of the most common issues patients have following surgery is constipation.  Even if you have a regular bowel pattern at home, your normal regimen is likely to be disrupted due to multiple reasons following surgery.  Combination of anesthesia, postoperative narcotics, change in appetite and fluid intake all can affect your bowels.  In order to avoid complications following surgery, here are some recommendations in order to help you during your recovery period.  Colace (docusate) - Pick up an over-the-counter form of Colace or another stool softener and take twice a day as long as you are requiring postoperative pain medications.  Take with a full glass of water daily.  If you experience loose stools or diarrhea, hold the colace until you stool forms back up. If your symptoms do not get better within 1 week or if they get worse, check with your doctor. Dulcolax (bisacodyl) - Pick up over-the-counter and take as directed by the product packaging as needed to assist with the movement of your bowels.  Take with a full glass of water.  Use this product as needed if not relieved by Colace only.  MiraLax (polyethylene glycol) - Pick up over-the-counter to have on hand. MiraLax is a solution that will increase the amount of water in your bowels to assist with bowel movements.  Take as directed and can mix with a glass of water, juice, soda, coffee, or tea. Take if you go more than two days without a movement. Do not use MiraLax more than once per day. Call your doctor if you are still constipated or irregular after using this medication for 7 days in a row.  If you continue  to have problems with postoperative constipation, please contact the office for further assistance and recommendations.  If you experience "the worst abdominal pain ever" or develop nausea or vomiting, please contact the office immediatly for further recommendations for treatment.  ITCHING If you experience itching with your medications, try taking only a single pain pill, or even half a pain pill at a time.  You can also use Benadryl over the counter for itching or also to help with sleep.   MEDICATIONS See your medication summary on the "After Visit Summary" that the nursing staff will review with you prior to discharge.  You may have some home medications which will be placed on hold until you complete the course of blood thinner medication.  It is important for you to complete the blood thinner medication as prescribed by your surgeon.  Continue your approved medications as instructed at time of discharge.  PRECAUTIONS If you experience chest pain or shortness of breath - call 911 immediately for transfer to the hospital emergency department.  If you develop a fever greater that 101  F, purulent drainage from wound, increased redness or drainage from wound, foul odor from the wound/dressing, or calf pain - CONTACT YOUR SURGEON.                                                   FOLLOW-UP APPOINTMENTS Make sure you keep all of your appointments after your operation with your surgeon and caregivers. You should call the office at the above phone number and make an appointment for approximately two weeks after the date of your surgery or on the date instructed by your surgeon outlined in the "After Visit Summary".  RANGE OF MOTION AND STRENGTHENING EXERCISES  Rehabilitation of the knee is important following a knee injury or an operation. After just a few days of immobilization, the muscles of the thigh which control the knee become weakened and shrink (atrophy). Knee exercises are designed to build up  the tone and strength of the thigh muscles and to improve knee motion. Often times heat used for twenty to thirty minutes before working out will loosen up your tissues and help with improving the range of motion but do not use heat for the first two weeks following surgery. These exercises can be done on a training (exercise) mat, on the floor, on a table or on a bed. Use what ever works the best and is most comfortable for you Knee exercises include:  Leg Lifts - While your knee is still immobilized in a splint or cast, you can do straight leg raises. Lift the leg to 60 degrees, hold for 3 sec, and slowly lower the leg. Repeat 10-20 times 2-3 times daily. Perform this exercise against resistance later as your knee gets better.  Quad and Hamstring Sets - Tighten up the muscle on the front of the thigh (Quad) and hold for 5-10 sec. Repeat this 10-20 times hourly. Hamstring sets are done by pushing the foot backward against an object and holding for 5-10 sec. Repeat as with quad sets.  Leg Slides: Lying on your back, slowly slide your foot toward your buttocks, bending your knee up off the floor (only go as far as is comfortable). Then slowly slide your foot back down until your leg is flat on the floor again. Angel Wings: Lying on your back spread your legs to the side as far apart as you can without causing discomfort.  A rehabilitation program following serious knee injuries can speed recovery and prevent re-injury in the future due to weakened muscles. Contact your doctor or a physical therapist for more information on knee rehabilitation.   POST-OPERATIVE OPIOID TAPER INSTRUCTIONS: It is important to wean off of your opioid medication as soon as possible. If you do not need pain medication after your surgery it is ok to stop day one. Opioids include: Codeine, Hydrocodone(Norco, Vicodin), Oxycodone(Percocet, oxycontin) and hydromorphone amongst others.  Long term and even short term use of opiods can  cause: Increased pain response Dependence Constipation Depression Respiratory depression And more.  Withdrawal symptoms can include Flu like symptoms Nausea, vomiting And more Techniques to manage these symptoms Hydrate well Eat regular healthy meals Stay active Use relaxation techniques(deep breathing, meditating, yoga) Do Not substitute Alcohol to help with tapering If you have been on opioids for less than two weeks and do not have pain than it is ok to stop all together.  Plan to wean off of opioids This plan should start within one week post op of your joint replacement. Maintain the same interval or time between taking each dose and first decrease the dose.  Cut the total daily intake of opioids by one tablet each day Next start to increase the time between doses. The last dose that should be eliminated is the evening dose.   IF YOU ARE TRANSFERRED TO A SKILLED REHAB FACILITY If the patient is transferred to a skilled rehab facility following release from the hospital, a list of the current medications will be sent to the facility for the patient to continue.  When discharged from the skilled rehab facility, please have the facility set up the patient's Home Health Physical Therapy prior to being released. Also, the skilled facility will be responsible for providing the patient with their medications at time of release from the facility to include their pain medication, the muscle relaxants, and their blood thinner medication. If the patient is still at the rehab facility at time of the two week follow up appointment, the skilled rehab facility will also need to assist the patient in arranging follow up appointment in our office and any transportation needs.  MAKE SURE YOU:  Understand these instructions.  Get help right away if you are not doing well or get worse.   DENTAL ANTIBIOTICS:  In most cases prophylactic antibiotics for Dental procdeures after total joint surgery are  not necessary.  Exceptions are as follows:  1. History of prior total joint infection  2. Severely immunocompromised (Organ Transplant, cancer chemotherapy, Rheumatoid biologic medications such as Humera)  3. Poorly controlled diabetes (A1C &gt; 8.0, blood glucose over 200)  If you have one of these conditions, contact your surgeon for an antibiotic prescription, prior to your dental procedure.    Pick up stool softner and laxative for home use following surgery while on pain medications. Do not submerge incision under water. Please use good hand washing techniques while changing dressing each day. May shower starting three days after surgery. Please use a clean towel to pat the incision dry following showers. Continue to use ice for pain and swelling after surgery. Do not use any lotions or creams on the incision until instructed by your surgeon.

## 2023-07-27 NOTE — Progress Notes (Signed)
 Orthopedic Tech Progress Note Patient Details:  [name] 1955/11/04 [id number]  Ortho Devices Type of Ortho Device: CPM padding Ortho Device/Splint Interventions: Ordered, Application, Adjustment   Post Interventions Patient Tolerated: Well Instructions Provided: Adjustment of device, Care of device  Kizzie Fantasia 07/27/2023, 2:10 PM

## 2023-07-27 NOTE — Anesthesia Procedure Notes (Signed)
 Spinal  Patient location during procedure: OR Start time: 07/27/2023 9:27 AM End time: 07/27/2023 9:29 AM Reason for block: surgical anesthesia Staffing Performed: anesthesiologist  Anesthesiologist: Mal Amabile, MD Performed by: Mal Amabile, MD Authorized by: Mal Amabile, MD   Preanesthetic Checklist Completed: patient identified, IV checked, site marked, risks and benefits discussed, surgical consent, monitors and equipment checked, pre-op evaluation and timeout performed Spinal Block Patient position: sitting Prep: DuraPrep and site prepped and draped Patient monitoring: heart rate, cardiac monitor, continuous pulse ox and blood pressure Approach: midline Location: L3-4 Injection technique: single-shot Needle Needle type: Pencan  Needle gauge: 24 G Needle length: 9 cm Needle insertion depth: 6 cm Assessment Sensory level: T4 Events: CSF return Additional Notes Patient tolerated procedure well. Adequate sensory level.

## 2023-07-27 NOTE — Interval H&P Note (Signed)
 History and Physical Interval Note:  07/27/2023 7:07 AM  [name]  has presented today for surgery, with the diagnosis of unstable left total knee arthroplasty.  The various methods of treatment have been discussed with the patient and family. After consideration of risks, benefits and other options for treatment, the patient has consented to  Procedure(s): Left knee polyethylene versus total knee arthroplasty revision (Left) as a surgical intervention.  The patient's history has been reviewed, patient examined, no change in status, stable for surgery.  I have reviewed the patient's chart and labs.  Questions were answered to the patient's satisfaction.     Homero Fellers Trany Chernick

## 2023-07-27 NOTE — Anesthesia Postprocedure Evaluation (Signed)
 Anesthesia Post Note  Patient: [name]  Procedure(s) Performed: LEFT TOTAL KNEE ARTHROPLASTY REVISION (Left: Knee)     Patient location during evaluation: PACU Anesthesia Type: Spinal Level of consciousness: oriented and awake and alert Pain management: pain level controlled Vital Signs Assessment: post-procedure vital signs reviewed and stable Respiratory status: spontaneous breathing, respiratory function stable and nonlabored ventilation Cardiovascular status: blood pressure returned to baseline and stable Postop Assessment: no headache, no backache, no apparent nausea or vomiting and spinal receding Anesthetic complications: no   No notable events documented.  Last Vitals:  Vitals:   07/27/23 1200 07/27/23 1215  BP: 105/72 104/69  Pulse: 88 90  Resp: 17 (!) 21  Temp:    SpO2: 96% 96%    Last Pain:  Vitals:   07/27/23 1215  TempSrc:   PainSc: 0-No pain                 Feven Alderfer A.
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
## 2023-07-27 NOTE — Anesthesia Procedure Notes (Signed)
 Anesthesia Regional Block: Adductor canal block   Pre-Anesthetic Checklist: , timeout performed,  Correct Patient, Correct Site, Correct Laterality,  Correct Procedure, Correct Position, site marked,  Risks and benefits discussed,  Surgical consent,  Pre-op evaluation,  At surgeon's request and post-op pain management  Laterality: Left  Prep: chloraprep       Needles:  Injection technique: Single-shot  Needle Type: Echogenic Stimulator Needle     Needle Length: 10cm  Needle Gauge: 21   Needle insertion depth: 7 cm   Additional Needles:   Narrative:  Start time: 07/27/2023 9:10 AM End time: 07/27/2023 9:15 AM Injection made incrementally with aspirations every 5 mL.  Performed by: Personally  Anesthesiologist: Mal Amabile, MD  Additional Notes: Timeout performed. Patient sedated. Relevant anatomy ID'd using [place]. Incremental 2-71ml injection of LA with frequent aspiration. Patient tolerated procedure well.

## 2023-07-27 NOTE — Brief Op Note (Signed)
 07/27/2023  11:00 AM  PATIENT:  [name]  68 y.o. female  PRE-OPERATIVE DIAGNOSIS:  unstable left total knee arthroplasty  POST-OPERATIVE DIAGNOSIS:  unstable left total knee arthroplasty  PROCEDURE:  Procedure(s): LEFT TOTAL KNEE ARTHROPLASTY REVISION (Left)  SURGEON:  Surgeons and Role:    Ollen Gross, MD - Primary  PHYSICIAN ASSISTANT:   ASSISTANTS: Arcola Jansky, PA-C   ANESTHESIA:   regional and spinal  EBL:  25 ml  BLOOD ADMINISTERED:none  DRAINS: none   LOCAL MEDICATIONS USED:  OTHER Exparel  COUNTS:  YES  TOURNIQUET:   Total Tourniquet Time Documented: Thigh (Left) - 58 minutes Total: Thigh (Left) - 58 minutes   DICTATION: .Other Dictation: Dictation Number [id number]  PLAN OF CARE: Admit to inpatient   PATIENT DISPOSITION:  PACU - hemodynamically stable.

## 2023-07-27 NOTE — Transfer of Care (Signed)
 Immediate Anesthesia Transfer of Care Note  Patient: ANNABELL OCONNOR  Procedure(s) Performed: Procedure(s): LEFT TOTAL KNEE ARTHROPLASTY REVISION (Left)  Patient Location: PACU  Anesthesia Type:MAC, Regional, and Spinal  Level of Consciousness: Patient easily awoken, comfortable, cooperative, following commands, responds to stimulation.   Airway & Oxygen Therapy: Patient spontaneously breathing, ventilating well, oxygen via simple oxygen mask.  Post-op Assessment: Report given to PACU RN, vital signs reviewed and stable.   Post vital signs: Reviewed and stable.  Complications: No apparent anesthesia complications  Last Vitals:  Vitals Value Taken Time  BP 108/61 07/27/23 1130  Temp    Pulse 89 07/27/23 1131  Resp 15 07/27/23 1131  SpO2 97 % 07/27/23 1131  Vitals shown include unfiled device data.  Last Pain:  Vitals:   07/27/23 0915  TempSrc:   PainSc: 0-No pain      Patients Stated Pain Goal: 3 (07/27/23 0740)  Complications: No notable events documented.

## 2023-07-28 ENCOUNTER — Encounter (HOSPITAL_COMMUNITY): Payer: Self-pay | Admitting: Orthopedic Surgery

## 2023-07-28 LAB — CBC
HCT: 36.3 % (ref 36.0–46.0)
Hemoglobin: 11.6 g/dL — ABNORMAL LOW (ref 12.0–15.0)
MCH: 30.8 pg (ref 26.0–34.0)
MCHC: 32 g/dL (ref 30.0–36.0)
MCV: 96.3 fL (ref 80.0–100.0)
Platelets: 217 10*3/uL (ref 150–400)
RBC: 3.77 MIL/uL — ABNORMAL LOW (ref 3.87–5.11)
RDW: 12.7 % (ref 11.5–15.5)
WBC: 14.5 10*3/uL — ABNORMAL HIGH (ref 4.0–10.5)
nRBC: 0 % (ref 0.0–0.2)

## 2023-07-28 LAB — BASIC METABOLIC PANEL
Anion gap: 12 (ref 5–15)
BUN: 15 mg/dL (ref 8–23)
CO2: 26 mmol/L (ref 22–32)
Calcium: 7.8 mg/dL — ABNORMAL LOW (ref 8.9–10.3)
Chloride: 101 mmol/L (ref 98–111)
Creatinine, Ser: 0.79 mg/dL (ref 0.44–1.00)
GFR, Estimated: >60 mL/min (ref >60)
Glucose, Bld: 124 mg/dL — ABNORMAL HIGH (ref 70–99)
Potassium: 4.1 mmol/L (ref 3.5–5.1)
Sodium: 139 mmol/L (ref 135–145)

## 2023-07-28 MED ORDER — PROMETHAZINE HCL 25 MG PO TABS
25.0000 mg | ORAL_TABLET | Freq: Four times a day (QID) | ORAL | Status: DC | PRN
Start: 2023-07-28 — End: 2023-07-29

## 2023-07-28 NOTE — Progress Notes (Signed)
 The patient requested her foley catheter removed on day shift yesterday. Patient ambulating to the bathroom. The patient continued to have a pain level of 9-10 overnight.
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
## 2023-07-28 NOTE — Progress Notes (Signed)
 Orthopedic Tech Progress Note Patient Details:  [name] 1955-09-06 [id number]  CPM Left Knee CPM Left Knee: On Left Knee Flexion (Degrees): 40 Left Knee Extension (Degrees): 10  Post Interventions Patient Tolerated: Well Instructions Provided: Adjustment of device, Care of device  Kizzie Fantasia 07/28/2023, 3:42 PM
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
## 2023-07-29 ENCOUNTER — Other Ambulatory Visit (HOSPITAL_COMMUNITY): Payer: Self-pay

## 2023-07-29 LAB — CBC
HCT: 31.8 % — ABNORMAL LOW (ref 36.0–46.0)
Hemoglobin: 10.3 g/dL — ABNORMAL LOW (ref 12.0–15.0)
MCH: 31.2 pg (ref 26.0–34.0)
MCHC: 32.4 g/dL (ref 30.0–36.0)
MCV: 96.4 fL (ref 80.0–100.0)
Platelets: 195 10*3/uL (ref 150–400)
RBC: 3.3 MIL/uL — ABNORMAL LOW (ref 3.87–5.11)
RDW: 13.2 % (ref 11.5–15.5)
WBC: 9.6 10*3/uL (ref 4.0–10.5)
nRBC: 0 % (ref 0.0–0.2)

## 2023-07-29 MED ORDER — TRAMADOL HCL 50 MG PO TABS
50.0000 mg | ORAL_TABLET | Freq: Four times a day (QID) | ORAL | 0 refills | Status: DC | PRN
  Filled 2023-07-29 (×2): qty 40, 5d supply, fill #0

## 2023-07-29 MED ORDER — ASPIRIN 81 MG PO CHEW
81.0000 mg | CHEWABLE_TABLET | Freq: Two times a day (BID) | ORAL | 0 refills | Status: AC
  Filled 2023-07-29 (×2): qty 63, 32d supply, fill #0

## 2023-07-29 MED ORDER — PROMETHAZINE HCL 25 MG PO TABS
25.0000 mg | ORAL_TABLET | Freq: Four times a day (QID) | ORAL | 0 refills | Status: DC | PRN
  Filled 2023-07-29 (×2): qty 30, 8d supply, fill #0

## 2023-07-29 MED ORDER — METHOCARBAMOL 500 MG PO TABS
500.0000 mg | ORAL_TABLET | Freq: Four times a day (QID) | ORAL | 0 refills | Status: DC | PRN
  Filled 2023-07-29 (×2): qty 40, 10d supply, fill #0

## 2023-07-29 MED ORDER — HYDROMORPHONE HCL 2 MG PO TABS
2.0000 mg | ORAL_TABLET | Freq: Four times a day (QID) | ORAL | 0 refills | Status: DC | PRN
Start: 2023-07-29 — End: 2023-08-24
  Filled 2023-07-29 (×2): qty 42, 6d supply, fill #0

## 2023-07-29 NOTE — TOC Initial Note (Addendum)
 Transition of Care [facility]) - Initial/Assessment Note    Patient Details  Name: [name] MRN: [id number] Date of Birth: [birth year]  Transition of Care [facility]) CM/SW Contact:    Adrian Prows, RN Phone Number: 07/29/2023, 11:13 AM  Clinical Narrative:                 Spoke w/ pt in room; pt says she lives at home; she plans to return at d/c; pt identified POC dtr Fabio Neighbors [phone]); pt says family will provide transportation; she verified insurance/PCP; pt denied SDOH risks; she has cane, and walker; pt says she does not have HH services or home oxygen; no TOC needs.  -1245- notified orders for HHPT placed; pt does not have agency preference; spoke w. Amy Hyatt at [place]; she says agency can provide service; also Amy will obtain VA authorization; pt notified; agency contact info placed in follow up provider section of d/c instructions; no TOC needs  -1313- per Arther Abbott, PA, order for pt's ice machine will cbe sent to [facility] as pt does not want to pay up front for device; office will send orders; the dme will be delivered to her home; no TOC needs. Expected Discharge Plan: Home/Self Care Barriers to Discharge: No Barriers Identified   Patient Goals and CMS Choice Patient states their goals for this hospitalization and ongoing recovery are:: home CMS Medicare.gov Compare Post Acute Care list provided to:: Patient        Expected Discharge Plan and Services   Discharge Planning Services: CM Consult Post Acute Care Choice: NA Living arrangements for the past 2 months: Single Family Home Expected Discharge Date: 07/29/23               DME Arranged: N/A DME Agency: NA       HH Arranged: NA HH Agency: NA        Prior Living Arrangements/Services Living arrangements for the past 2 months: Single Family Home Lives with:: Self Patient language and need for interpreter reviewed:: Yes Do you feel safe going back to the place where you live?: Yes      Need  for Family Participation in Patient Care: Yes (Comment) Care giver support system in place?: Yes (comment) Current home services: DME (cane, walker) Criminal Activity/Legal Involvement Pertinent to Current Situation/Hospitalization: No - Comment as needed  Activities of Daily Living   ADL Screening (condition at time of admission) Independently performs ADLs?: Yes (appropriate for developmental age) Is the patient deaf or have difficulty hearing?: No Does the patient have difficulty seeing, even when wearing glasses/contacts?: No Does the patient have difficulty concentrating, remembering, or making decisions?: No  Permission Sought/Granted Permission sought to share information with : Case Manager Permission granted to share information with : Yes, Verbal Permission Granted  Share Information with NAME: Case Manager     Permission granted to share info w Relationship: Fabio Neighbors (dtr) [phone]     Emotional Assessment Appearance:: Appears stated age Attitude/Demeanor/Rapport: Gracious Affect (typically observed): Accepting Orientation: : Oriented to Self, Oriented to Place, Oriented to  Time, Oriented to Situation Alcohol / Substance Use: Not Applicable Psych Involvement: No (comment)  Admission diagnosis:  Failed total knee arthroplasty (HCC) [M57.846N, Z96.659] Patient Active Problem List   Diagnosis Date Noted   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   Trochanteric bursitis of right hip 04/26/2022   Raynaud's phenomenon 04/12/2022   Diverticulitis of colon 12/01/2021   OA (osteoarthritis) of knee 09/08/2020   Abnormal Pap smear  of cervix 11/03/2016   Glenohumeral arthritis, left 06/13/2016   Hypertension 09/24/2013   Asymptomatic postmenopausal status 07/20/2013   Hypothyroid 07/20/2013   Localized osteoarthrosis, hand 08/10/2011   PCP:  Clinic, [facility] Pharmacy:   [facility] [phone] - [place], [place] - [address] [place] [address] Phone: [phone] Fax: [phone]  [id number] - [place], [place] - [address] [place] [address] Phone: [phone] Fax: [phone]  [id number] [place] [facility] - [address] [place] [address] Phone: [phone] Fax: [phone]     Social Drivers of Health (SDOH) Social History: SDOH Screenings   Food Insecurity: No Food Insecurity (07/29/2023)  Housing: Low Risk  (07/29/2023)  Transportation Needs: No Transportation Needs (07/29/2023)  Utilities: Not At Risk (07/29/2023)  Financial Resource Strain: Low Risk  (09/23/2022)   Received from [facility]  Social Connections: Unknown (07/28/2023)  Tobacco Use: Low Risk  (07/27/2023)   SDOH Interventions: Food Insecurity Interventions: Intervention Not Indicated, Inpatient TOC Housing Interventions: Intervention Not Indicated, Inpatient TOC Transportation Interventions: Intervention Not Indicated, Inpatient TOC Utilities Interventions: Intervention Not Indicated, Inpatient TOC   Readmission Risk Interventions     No data to display
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
## 2023-07-29 NOTE — Plan of Care (Signed)
  Problem: Education: Goal: Knowledge of General Education information will improve Description: Including pain rating scale, medication(s)/side effects and non-pharmacologic comfort measures Outcome: Completed/Met   Problem: Health Behavior/Discharge Planning: Goal: Ability to manage health-related needs will improve Outcome: Adequate for Discharge   Problem: Clinical Measurements: Goal: Ability to maintain clinical measurements within normal limits will improve Outcome: Progressing Goal: Will remain free from infection Outcome: Progressing Goal: Diagnostic test results will improve Outcome: Progressing Goal: Respiratory complications will improve Outcome: Completed/Met Goal: Cardiovascular complication will be avoided Outcome: Completed/Met   Problem: Activity: Goal: Risk for activity intolerance will decrease Outcome: Completed/Met   Problem: Nutrition: Goal: Adequate nutrition will be maintained Outcome: Completed/Met   Problem: Coping: Goal: Level of anxiety will decrease Outcome: Adequate for Discharge   Problem: Elimination: Goal: Will not experience complications related to bowel motility Outcome: Progressing Goal: Will not experience complications related to urinary retention Outcome: Completed/Met   Problem: Pain Managment: Goal: General experience of comfort will improve and/or be controlled Outcome: Adequate for Discharge   Problem: Safety: Goal: Ability to remain free from injury will improve Outcome: Adequate for Discharge   Problem: Skin Integrity: Goal: Risk for impaired skin integrity will decrease Outcome: Completed/Met   Problem: Education: Goal: Knowledge of the prescribed therapeutic regimen will improve Outcome: Completed/Met Goal: Individualized Educational Video(s) Outcome: Completed/Met   Problem: Activity: Goal: Ability to avoid complications of mobility impairment will improve Outcome: Completed/Met Goal: Range of joint motion  will improve Outcome: Completed/Met   Problem: Clinical Measurements: Goal: Postoperative complications will be avoided or minimized Outcome: Progressing   Problem: Skin Integrity: Goal: Will show signs of wound healing Outcome: Completed/Met
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
## 2023-07-29 NOTE — Plan of Care (Signed)
 Patient verbalized understanding of discharge instructions.  Problem: Health Behavior/Discharge Planning: Goal: Ability to manage health-related needs will improve Outcome: Adequate for Discharge   Problem: Clinical Measurements: Goal: Ability to maintain clinical measurements within normal limits will improve Outcome: Adequate for Discharge Goal: Will remain free from infection Outcome: Adequate for Discharge Goal: Diagnostic test results will improve Outcome: Adequate for Discharge   Problem: Coping: Goal: Level of anxiety will decrease Outcome: Adequate for Discharge   Problem: Elimination: Goal: Will not experience complications related to bowel motility Outcome: Adequate for Discharge   Problem: Pain Managment: Goal: General experience of comfort will improve and/or be controlled Outcome: Adequate for Discharge   Problem: Safety: Goal: Ability to remain free from injury will improve Outcome: Adequate for Discharge   Problem: Clinical Measurements: Goal: Postoperative complications will be avoided or minimized Outcome: Adequate for Discharge   Problem: Pain Management: Goal: Pain level will decrease with appropriate interventions Outcome: Adequate for Discharge

## 2023-07-29 NOTE — H&P (View-Only) (Signed)
   Subjective: 2 Days Post-Op Procedure(s) (LRB): LEFT TOTAL KNEE ARTHROPLASTY REVISION (Left) Patient reports pain as mild.   Patient seen in rounds for Dr. Lequita Halt. Patient is well, and has had no acute complaints or problems. Knee is feeling well this AM, did very well with therapy yesterday. Plan is to go Home after hospital stay.  Objective: Vital signs in last 24 hours: Temp:  [98 F (36.7 C)-98.7 F (37.1 C)] 98 F (36.7 C) (03/21 0543) Pulse Rate:  [87-92] 88 (03/21 0543) Resp:  [16-18] 17 (03/21 0543) BP: (106-124)/(68-82) 106/71 (03/21 0543) SpO2:  [94 %-98 %] 96 % (03/21 0543)  Intake/Output from previous day: No intake or output data in the 24 hours ending 07/29/23 0805  Intake/Output this shift: No intake/output data recorded.  Labs: Recent Labs    07/28/23 0321 07/29/23 0322  HGB 11.6* 10.3*   Recent Labs    07/28/23 0321 07/29/23 0322  WBC 14.5* 9.6  RBC 3.77* 3.30*  HCT 36.3 31.8*  PLT 217 195   Recent Labs    07/28/23 0321  NA 139  K 4.1  CL 101  CO2 26  BUN 15  CREATININE 0.79  GLUCOSE 124*  CALCIUM 7.8*   No results for input(s): "LABPT", "INR" in the last 72 hours.  Exam: General - Patient is Alert and Oriented Extremity - Neurologically intact Neurovascular intact Sensation intact distally Dorsiflexion/Plantar flexion intact Dressing/Incision - clean, dry, no drainage Motor Function - intact, moving foot and toes well on exam.   Past Medical History:  Diagnosis Date   Anxiety    Arthritis    Asthma    Cancer (HCC)    skin   Cervical cancer (HCC)    Cholecystitis    Depression    Diverticulitis    GERD (gastroesophageal reflux disease)    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   History of hip replacement    Bilateral   Hypertension    Hypothyroidism    Menopause    Pancreatitis    PONV (postoperative nausea and vomiting)    Shingles    Small bowel obstruction due to adhesions (HCC)    Traumatic injury of back      Assessment/Plan: 2 Days Post-Op Procedure(s) (LRB): LEFT TOTAL KNEE ARTHROPLASTY REVISION (Left) Principal Problem:   Failed total knee arthroplasty (HCC)  Estimated body mass index is 30.47 kg/m as calculated from the following:   Height as of this encounter: 5' (1.524 m).   Weight as of this encounter: 70.8 kg. Up with therapy  DVT Prophylaxis - Aspirin Weight-bearing as tolerated  Plan for discharge after 1-2 sessions today. OPPT to begin next week. Follow-up in the office in 2 weeks  The PDMP database was reviewed today prior to any opioid medications being prescribed to this patient.  Arther Abbott, PA-C Orthopedic Surgery [phone] 07/29/2023, 8:05 AM

## 2023-07-30 DIAGNOSIS — E876 Hypokalemia: Secondary | ICD-10-CM | POA: Diagnosis not present

## 2023-07-30 DIAGNOSIS — Z9089 Acquired absence of other organs: Secondary | ICD-10-CM | POA: Diagnosis not present

## 2023-07-30 DIAGNOSIS — I10 Essential (primary) hypertension: Secondary | ICD-10-CM | POA: Diagnosis not present

## 2023-07-30 DIAGNOSIS — R9431 Abnormal electrocardiogram [ECG] [EKG]: Secondary | ICD-10-CM | POA: Diagnosis not present

## 2023-07-30 DIAGNOSIS — R202 Paresthesia of skin: Secondary | ICD-10-CM | POA: Diagnosis not present

## 2023-08-01 LAB — AEROBIC/ANAEROBIC CULTURE W GRAM STAIN (SURGICAL/DEEP WOUND)
Culture: NO GROWTH
Gram Stain: NONE SEEN

## 2023-08-04 ENCOUNTER — Emergency Department (HOSPITAL_COMMUNITY)

## 2023-08-04 ENCOUNTER — Other Ambulatory Visit: Payer: Self-pay

## 2023-08-04 ENCOUNTER — Emergency Department (HOSPITAL_COMMUNITY)
Admission: EM | Admit: 2023-08-04 | Discharge: 2023-08-04 | Disposition: A | Discharge status: Home / Self Care | Attending: Emergency Medicine | Admitting: Emergency Medicine

## 2023-08-04 ENCOUNTER — Encounter (HOSPITAL_COMMUNITY): Payer: Self-pay | Admitting: Emergency Medicine

## 2023-08-04 DIAGNOSIS — W01198A Fall on same level from slipping, tripping and stumbling with subsequent striking against other object, initial encounter: Secondary | ICD-10-CM | POA: Insufficient documentation

## 2023-08-04 DIAGNOSIS — W19XXXA Unspecified fall, initial encounter: Secondary | ICD-10-CM

## 2023-08-04 DIAGNOSIS — M25562 Pain in left knee: Secondary | ICD-10-CM | POA: Insufficient documentation

## 2023-08-04 DIAGNOSIS — M25561 Pain in right knee: Secondary | ICD-10-CM

## 2023-08-04 LAB — COMPREHENSIVE METABOLIC PANEL WITH GFR
ALT: 15 U/L (ref 0–44)
AST: 19 U/L (ref 15–41)
Albumin: 3.8 g/dL (ref 3.5–5.0)
Alkaline Phosphatase: 53 U/L (ref 38–126)
Anion gap: 12 (ref 5–15)
BUN: 24 mg/dL — ABNORMAL HIGH (ref 8–23)
CO2: 24 mmol/L (ref 22–32)
Calcium: 8.9 mg/dL (ref 8.9–10.3)
Chloride: 102 mmol/L (ref 98–111)
Creatinine, Ser: 0.88 mg/dL (ref 0.44–1.00)
GFR, Estimated: >60 mL/min (ref >60)
Glucose, Bld: 101 mg/dL — ABNORMAL HIGH (ref 70–99)
Potassium: 4 mmol/L (ref 3.5–5.1)
Sodium: 138 mmol/L (ref 135–145)
Total Bilirubin: 0.7 mg/dL (ref 0.0–1.2)
Total Protein: 6.6 g/dL (ref 6.5–8.1)

## 2023-08-04 LAB — CBC WITH DIFFERENTIAL/PLATELET
Abs Immature Granulocytes: 0.07 10*3/uL (ref 0.00–0.07)
Basophils Absolute: 0.1 10*3/uL (ref 0.0–0.1)
Basophils Relative: 1 %
Eosinophils Absolute: 0.3 10*3/uL (ref 0.0–0.5)
Eosinophils Relative: 3 %
HCT: 37.2 % (ref 36.0–46.0)
Hemoglobin: 12.2 g/dL (ref 12.0–15.0)
Immature Granulocytes: 1 %
Lymphocytes Relative: 28 %
Lymphs Abs: 3 10*3/uL (ref 0.7–4.0)
MCH: 31 pg (ref 26.0–34.0)
MCHC: 32.8 g/dL (ref 30.0–36.0)
MCV: 94.4 fL (ref 80.0–100.0)
Monocytes Absolute: 1.1 10*3/uL — ABNORMAL HIGH (ref 0.1–1.0)
Monocytes Relative: 10 %
Neutro Abs: 6.2 10*3/uL (ref 1.7–7.7)
Neutrophils Relative %: 57 %
Platelets: 317 10*3/uL (ref 150–400)
RBC: 3.94 MIL/uL (ref 3.87–5.11)
RDW: 12.7 % (ref 11.5–15.5)
WBC: 10.7 10*3/uL — ABNORMAL HIGH (ref 4.0–10.5)
nRBC: 0 % (ref 0.0–0.2)

## 2023-08-04 MED ORDER — HYDROMORPHONE HCL 1 MG/ML IJ SOLN
0.5000 mg | Freq: Once | INTRAMUSCULAR | Status: AC
  Administered 2023-08-04: 0.5 mg via INTRAVENOUS
  Filled 2023-08-04: qty 1

## 2023-08-04 NOTE — Discharge Summary (Signed)
 Patient ID: [name] MRN: [id number] DOB/AGE: 12-15-1955 68 y.o.  Admit date: 07/27/2023 Discharge date: 07/29/2023  Admission Diagnoses:  Principal Problem:   Failed total knee arthroplasty [facility])   Discharge Diagnoses:  Same  Past Medical History:  Diagnosis Date   Anxiety    Arthritis    Asthma    Cancer (HCC)    skin   Cervical cancer (HCC)    Cholecystitis    Depression    Diverticulitis    GERD (gastroesophageal reflux disease)    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   History of hip replacement    Bilateral   Hypertension    Hypothyroidism    Menopause    Pancreatitis    PONV (postoperative nausea and vomiting)    Shingles    Small bowel obstruction due to adhesions [facility])    Traumatic injury of back     Surgeries: Procedure(s): LEFT TOTAL KNEE ARTHROPLASTY REVISION on 07/27/2023   Consultants:   Discharged Condition: Improved  Hospital Course: [name] is an 68 y.o. female who was admitted 07/27/2023 for operative treatment ofFailed total knee arthroplasty (HCC). Patient has severe unremitting pain that affects sleep, daily activities, and work/hobbies. After pre-op clearance the patient was taken to the operating room on 07/27/2023 and underwent  Procedure(s): LEFT TOTAL KNEE ARTHROPLASTY REVISION.    Patient was given perioperative antibiotics:  Anti-infectives (From admission, onward)    Start     Dose/Rate Route Frequency Ordered Stop   07/28/23 1000  valACYclovir (VALTREX) tablet 500 mg  Status:  Discontinued        500 mg Oral Daily 07/27/23 1326 07/29/23 1914   07/27/23 1700  ceFAZolin (ANCEF) IVPB 2g/100 mL premix        2 g 200 mL/hr over 30 Minutes Intravenous Every 6 hours 07/27/23 1555 07/27/23 2339   07/27/23 0700  ceFAZolin (ANCEF) IVPB 2g/100 mL premix        2 g 200 mL/hr over 30 Minutes Intravenous On call to O.R. 07/27/23 0657 07/27/23 0930        Patient was given sequential compression devices, early ambulation, and  chemoprophylaxis to prevent DVT.  Patient benefited maximally from hospital stay and there were no complications.    Recent vital signs: No data found.   Recent laboratory studies: No results for input(s): "WBC", "HGB", "HCT", "PLT", "NA", "K", "CL", "CO2", "BUN", "CREATININE", "GLUCOSE", "INR", "CALCIUM" in the last 72 hours.  Invalid input(s): "PT", "2"   Discharge Medications:   Allergies as of 07/29/2023       Reactions   Anesthetics, Ester Nausea And Vomiting   Lidocaine Nausea And Vomiting   Other Cough   Refuse blood or blood products   Oxycodone-acetaminophen Nausea And Vomiting, Rash   Sulfa Antibiotics Hives   Sulfasalazine Hives, Rash   Amlodipine Swelling   Codeine Hives, Nausea And Vomiting   Hydrocodone Nausea And Vomiting   Ivp Dye [iodinated Contrast Media] Hives   Lisinopril Itching   Can take split doses-takes 10 in am and 10 in evening   Oxycodone Nausea And Vomiting   Prednisone Swelling, Other (See Comments)   Causes her to have vaginal bleeding. Agitation    Ropinirole Nausea And Vomiting, Other (See Comments)   Gi upset         Medication List     TAKE these medications    albuterol 108 (90 Base) MCG/ACT inhaler Commonly known as: VENTOLIN HFA Inhale 2-6 puffs into the lungs as needed for wheezing or  shortness of breath.   Aspirin Low Dose 81 MG chewable tablet Generic drug: aspirin Chew 1 tablet (81 mg total) by mouth 2 (two) times daily for 21 days. Then take one 81 mg aspirin once a day for three weeks. Then discontinue aspirin.   baclofen 10 MG tablet Commonly known as: LIORESAL Take 5 mg by mouth daily as needed for muscle spasms.   calcium citrate 950 (200 Ca) MG tablet Commonly known as: CALCITRATE - dosed in mg elemental calcium Take 1 tablet by mouth 2 (two) times daily.   chlorhexidine 4 % external liquid Commonly known as: HIBICLENS Apply 15 mLs (1 Application total) topically as directed for 30 doses. Use as directed daily  for 5 days every other week for 6 weeks.   cyanocobalamin 1000 MCG tablet Commonly known as: VITAMIN B12 Take 1,000 mcg by mouth daily.   estradiol 0.05 MG/24HR patch Commonly known as: Vivelle-Dot Place 1 patch (0.05 mg total) onto the skin 2 (two) times a week.   Estradiol 10 MCG Tabs vaginal tablet Place 10 mcg vaginally daily.   fluticasone 50 MCG/ACT nasal spray Commonly known as: FLONASE Place 2 sprays into both nostrils as needed for allergies or rhinitis.   HYDROmorphone 2 MG tablet Commonly known as: DILAUDID Take 1-2 tablets (2-4 mg total) by mouth every 6 (six) hours as needed for severe pain (pain score 7-10).   lisinopril 10 MG tablet Commonly known as: ZESTRIL Take 10 mg by mouth in the morning and at bedtime.   methocarbamol 500 MG tablet Commonly known as: ROBAXIN Take 1 tablet (500 mg total) by mouth every 6 (six) hours as needed for muscle spasms.   mupirocin ointment 2 % Commonly known as: BACTROBAN Place 1 Application into the nose 2 (two) times daily for 60 doses. Use as directed 2 times daily for 5 days every other week for 6 weeks.   NP Thyroid 15 MG tablet Generic drug: thyroid Take 15 mg by mouth daily.   NP Thyroid 90 MG tablet Generic drug: thyroid Take 90 mg by mouth daily.   phentermine 37.5 MG tablet Commonly known as: ADIPEX-P Take 37.5 mg by mouth daily before breakfast.   promethazine 25 MG tablet Commonly known as: PHENERGAN Take 1 tablet (25 mg total) by mouth every 6 (six) hours as needed for nausea or vomiting.   rOPINIRole 1 MG tablet Commonly known as: REQUIP Take 2 mg by mouth at bedtime.   traMADol 50 MG tablet Commonly known as: ULTRAM Take 1-2 tablets (50-100 mg total) by mouth every 6 (six) hours as needed for moderate pain (pain score 4-6).   valACYclovir 500 MG tablet Commonly known as: VALTREX Take 500 mg by mouth daily.   VITAMIN B-3 PO Take 500 mg by mouth daily.   vitamin C 1000 MG tablet Take 1,000 mg  by mouth daily.   Vitamin D3 1000 units Caps Take 1,000 Units by mouth daily.               Discharge Care Instructions  (From admission, onward)           Start     Ordered   07/29/23 0000  Weight bearing as tolerated        07/29/23 0808   07/29/23 0000  Change dressing       Comments: You may remove the bulky bandage (ACE wrap and gauze) two days after surgery. You will have an adhesive waterproof bandage underneath. Leave this in place until your  first follow-up appointment.   07/29/23 6045            Diagnostic Studies: No results found.  Disposition: Discharge disposition: 06-Home-Health Care Svc       Discharge Instructions     Call MD / Call 911   Complete by: As directed    If you experience chest pain or shortness of breath, CALL 911 and be transported to the hospital emergency room.  If you develope a fever above 101 F, pus (white drainage) or increased drainage or redness at the wound, or calf pain, call your surgeon's office.   Change dressing   Complete by: As directed    You may remove the bulky bandage (ACE wrap and gauze) two days after surgery. You will have an adhesive waterproof bandage underneath. Leave this in place until your first follow-up appointment.   Constipation Prevention   Complete by: As directed    Drink plenty of fluids.  Prune juice may be helpful.  You may use a stool softener, such as Colace (over the counter) 100 mg twice a day.  Use MiraLax (over the counter) for constipation as needed.   Diet - low sodium heart healthy   Complete by: As directed    Do not put a pillow under the knee. Place it under the heel.   Complete by: As directed    Driving restrictions   Complete by: As directed    No driving for two weeks   Post-operative opioid taper instructions:   Complete by: As directed    POST-OPERATIVE OPIOID TAPER INSTRUCTIONS: It is important to wean off of your opioid medication as soon as possible. If you do not  need pain medication after your surgery it is ok to stop day one. Opioids include: Codeine, Hydrocodone(Norco, Vicodin), Oxycodone(Percocet, oxycontin) and hydromorphone amongst others.  Long term and even short term use of opiods can cause: Increased pain response Dependence Constipation Depression Respiratory depression And more.  Withdrawal symptoms can include Flu like symptoms Nausea, vomiting And more Techniques to manage these symptoms Hydrate well Eat regular healthy meals Stay active Use relaxation techniques(deep breathing, meditating, yoga) Do Not substitute Alcohol to help with tapering If you have been on opioids for less than two weeks and do not have pain than it is ok to stop all together.  Plan to wean off of opioids This plan should start within one week post op of your joint replacement. Maintain the same interval or time between taking each dose and first decrease the dose.  Cut the total daily intake of opioids by one tablet each day Next start to increase the time between doses. The last dose that should be eliminated is the evening dose.      TED hose   Complete by: As directed    Use stockings (TED hose) for three weeks on both leg(s).  You may remove them at night for sleeping.   Weight bearing as tolerated   Complete by: As directed         Follow-up Information     Aluisio, Homero Fellers, MD. Schedule an appointment as soon as possible for a visit in 2 week(s).   Specialty: Orthopedic Surgery Contact information: [address] [place] [address] [place] [address] [phone]         [facility] Follow up.   Contact information: [address] [place] [address] [phone]  Signed: Arther Abbott 08/04/2023, 2:45 PM

## 2023-08-04 NOTE — ED Notes (Signed)
 Patient transported to X-ray
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
## 2023-08-04 NOTE — Discharge Instructions (Signed)
 After your fall today, we scanned your head and neck spine.  There were no breaks.  There were no skull fractures.  There is no bleeding in your head.  We x-rayed your knee and upper part of your thigh of your left leg.  There are no breaks.  Your knee looks as it should after surgery.  Continue to follow the recommendations of your orthopedic surgeon.  Follow-up as scheduled with your surgeon.

## 2023-08-04 NOTE — ED Triage Notes (Signed)
 Pt BIB EMS following a fall, states that she fell backwards hitting her head and think she may have hit her L knee. Pt had a L knee replacement on 3/19. States that her pain in her knee has worsened since the fall. C/o head, neck, and back pain. C-collar applied upon arrival to ED. States that she took her Dilaudid before coming in.

## 2023-08-23 NOTE — Progress Notes (Signed)
 Pt states she needs to "stay on calcium supplement this hospitalization,since I don't have a thyroid or parathyroid"

## 2023-08-24 ENCOUNTER — Inpatient Hospital Stay (HOSPITAL_COMMUNITY)

## 2023-08-24 ENCOUNTER — Other Ambulatory Visit: Payer: Self-pay

## 2023-08-24 ENCOUNTER — Encounter (HOSPITAL_COMMUNITY): Payer: Self-pay | Admitting: Orthopedic Surgery

## 2023-08-24 ENCOUNTER — Inpatient Hospital Stay (HOSPITAL_COMMUNITY)
Admission: RE | Admit: 2023-08-24 | Discharge: 2023-08-26 | DRG: 488 | Disposition: A | Discharge status: Home Health | Attending: Orthopedic Surgery | Admitting: Orthopedic Surgery

## 2023-08-24 ENCOUNTER — Encounter (HOSPITAL_COMMUNITY)
Admission: RE | Disposition: A | Discharge status: Home Health | Payer: Self-pay | Source: Home / Self Care | Attending: Orthopedic Surgery

## 2023-08-24 DIAGNOSIS — Y792 Prosthetic and other implants, materials and accessory orthopedic devices associated with adverse incidents: Secondary | ICD-10-CM | POA: Diagnosis present

## 2023-08-24 DIAGNOSIS — J45909 Unspecified asthma, uncomplicated: Secondary | ICD-10-CM

## 2023-08-24 DIAGNOSIS — Z96643 Presence of artificial hip joint, bilateral: Secondary | ICD-10-CM | POA: Diagnosis present

## 2023-08-24 DIAGNOSIS — I10 Essential (primary) hypertension: Secondary | ICD-10-CM | POA: Diagnosis present

## 2023-08-24 DIAGNOSIS — Z8541 Personal history of malignant neoplasm of cervix uteri: Secondary | ICD-10-CM

## 2023-08-24 DIAGNOSIS — Z7982 Long term (current) use of aspirin: Secondary | ICD-10-CM | POA: Diagnosis not present

## 2023-08-24 DIAGNOSIS — Z884 Allergy status to anesthetic agent status: Secondary | ICD-10-CM

## 2023-08-24 DIAGNOSIS — Z96652 Presence of left artificial knee joint: Secondary | ICD-10-CM | POA: Diagnosis present

## 2023-08-24 DIAGNOSIS — S86812A Strain of other muscle(s) and tendon(s) at lower leg level, left leg, initial encounter: Secondary | ICD-10-CM | POA: Diagnosis present

## 2023-08-24 DIAGNOSIS — Z79899 Other long term (current) drug therapy: Secondary | ICD-10-CM

## 2023-08-24 DIAGNOSIS — Z85828 Personal history of other malignant neoplasm of skin: Secondary | ICD-10-CM

## 2023-08-24 DIAGNOSIS — E039 Hypothyroidism, unspecified: Secondary | ICD-10-CM | POA: Diagnosis present

## 2023-08-24 DIAGNOSIS — Z91041 Radiographic dye allergy status: Secondary | ICD-10-CM

## 2023-08-24 DIAGNOSIS — S76112A Strain of left quadriceps muscle, fascia and tendon, initial encounter: Secondary | ICD-10-CM | POA: Diagnosis present

## 2023-08-24 DIAGNOSIS — T84023A Instability of internal left knee prosthesis, initial encounter: Secondary | ICD-10-CM | POA: Diagnosis present

## 2023-08-24 DIAGNOSIS — Z79818 Long term (current) use of other agents affecting estrogen receptors and estrogen levels: Secondary | ICD-10-CM | POA: Diagnosis not present

## 2023-08-24 DIAGNOSIS — Z885 Allergy status to narcotic agent status: Secondary | ICD-10-CM

## 2023-08-24 DIAGNOSIS — Z888 Allergy status to other drugs, medicaments and biological substances status: Secondary | ICD-10-CM | POA: Diagnosis not present

## 2023-08-24 DIAGNOSIS — Z882 Allergy status to sulfonamides status: Secondary | ICD-10-CM | POA: Diagnosis not present

## 2023-08-24 HISTORY — PX: REIMPLANTATION OF TOTAL KNEE: SHX6052

## 2023-08-24 HISTORY — PX: PATELLAR TENDON REPAIR: SHX737

## 2023-08-24 LAB — NO BLOOD PRODUCTS

## 2023-08-24 SURGERY — REPAIR, TENDON, PATELLAR
Anesthesia: Spinal | Site: Knee | Laterality: Left

## 2023-08-24 MED ORDER — 0.9 % SODIUM CHLORIDE (POUR BTL) OPTIME
TOPICAL | Status: DC | PRN
  Administered 2023-08-24: 1000 mL

## 2023-08-24 MED ORDER — BUPIVACAINE IN DEXTROSE 0.75-8.25 % IT SOLN
INTRATHECAL | Status: DC | PRN
  Administered 2023-08-24: 1.6 mL via INTRATHECAL

## 2023-08-24 MED ORDER — PHENYLEPHRINE HCL-NACL 20-0.9 MG/250ML-% IV SOLN
INTRAVENOUS | Status: DC | PRN
  Administered 2023-08-24: 50 ug/min via INTRAVENOUS

## 2023-08-24 MED ORDER — THYROID 60 MG PO TABS
90.0000 mg | ORAL_TABLET | Freq: Every day | ORAL | Status: DC
  Administered 2023-08-25 – 2023-08-26 (×2): 90 mg via ORAL
  Filled 2023-08-24 (×2): qty 1

## 2023-08-24 MED ORDER — ROPIVACAINE HCL 5 MG/ML IJ SOLN
INTRAMUSCULAR | Status: DC | PRN
  Administered 2023-08-24: 30 mL via PERINEURAL

## 2023-08-24 MED ORDER — HYDROMORPHONE HCL 1 MG/ML IJ SOLN: 0.2500 mg | INTRAMUSCULAR | Status: DC | PRN

## 2023-08-24 MED ORDER — PROPOFOL 500 MG/50ML IV EMUL
INTRAVENOUS | Status: DC | PRN
  Administered 2023-08-24: 80 ug/kg/min via INTRAVENOUS
  Administered 2023-08-24: 20 mg via INTRAVENOUS

## 2023-08-24 MED ORDER — CALCIUM CITRATE 950 (200 CA) MG PO TABS
1.0000 | ORAL_TABLET | Freq: Two times a day (BID) | ORAL | Status: DC
Start: 2023-08-25 — End: 2023-08-26
  Administered 2023-08-25 – 2023-08-26 (×3): 950 mg via ORAL
  Filled 2023-08-24 (×3): qty 1

## 2023-08-24 MED ORDER — DOCUSATE SODIUM 100 MG PO CAPS
100.0000 mg | ORAL_CAPSULE | Freq: Two times a day (BID) | ORAL | Status: DC
  Administered 2023-08-24 – 2023-08-26 (×4): 100 mg via ORAL
  Filled 2023-08-24 (×4): qty 1

## 2023-08-24 MED ORDER — ASPIRIN 81 MG PO CHEW
81.0000 mg | CHEWABLE_TABLET | Freq: Two times a day (BID) | ORAL | Status: DC
  Administered 2023-08-25 – 2023-08-26 (×3): 81 mg via ORAL
  Filled 2023-08-24 (×3): qty 1

## 2023-08-24 MED ORDER — PHENYLEPHRINE HCL (PRESSORS) 10 MG/ML IV SOLN
INTRAVENOUS | Status: DC | PRN
Start: 2023-08-24 — End: 2023-08-24
  Administered 2023-08-24: 80 ug via INTRAVENOUS

## 2023-08-24 MED ORDER — TRANEXAMIC ACID-NACL 1000-0.7 MG/100ML-% IV SOLN
1000.0000 mg | INTRAVENOUS | Status: AC
  Administered 2023-08-24: 1000 mg via INTRAVENOUS
  Filled 2023-08-24: qty 100

## 2023-08-24 MED ORDER — POVIDONE-IODINE 10 % EX SWAB: 2.0000 | Freq: Once | CUTANEOUS | Status: DC

## 2023-08-24 MED ORDER — TRAMADOL HCL 50 MG PO TABS
50.0000 mg | ORAL_TABLET | Freq: Four times a day (QID) | ORAL | Status: DC | PRN
Start: 2023-08-24 — End: 2023-08-26
  Administered 2023-08-25 – 2023-08-26 (×3): 100 mg via ORAL
  Filled 2023-08-24 (×3): qty 2

## 2023-08-24 MED ORDER — ROPINIROLE HCL 1 MG PO TABS
2.0000 mg | ORAL_TABLET | Freq: Every day | ORAL | Status: DC
Start: 2023-08-24 — End: 2023-08-26
  Administered 2023-08-24 – 2023-08-25 (×2): 2 mg via ORAL
  Filled 2023-08-24 (×2): qty 2

## 2023-08-24 MED ORDER — PHENYLEPHRINE 80 MCG/ML (10ML) SYRINGE FOR IV PUSH (FOR BLOOD PRESSURE SUPPORT)
PREFILLED_SYRINGE | INTRAVENOUS | Status: AC
  Filled 2023-08-24: qty 10

## 2023-08-24 MED ORDER — PROMETHAZINE HCL 25 MG PO TABS: 25.0000 mg | ORAL_TABLET | Freq: Four times a day (QID) | ORAL | Status: DC | PRN

## 2023-08-24 MED ORDER — PHENOL 1.4 % MT LIQD: 1.0000 | OROMUCOSAL | Status: DC | PRN

## 2023-08-24 MED ORDER — MUPIROCIN 2 % EX OINT: 1.0000 | TOPICAL_OINTMENT | Freq: Two times a day (BID) | CUTANEOUS | 0 refills | Status: AC

## 2023-08-24 MED ORDER — CLONIDINE HCL (ANALGESIA) 100 MCG/ML EP SOLN
EPIDURAL | Status: DC | PRN
  Administered 2023-08-24: 80 ug

## 2023-08-24 MED ORDER — METHOCARBAMOL 1000 MG/10ML IJ SOLN: 500.0000 mg | Freq: Four times a day (QID) | INTRAMUSCULAR | Status: DC | PRN

## 2023-08-24 MED ORDER — FENTANYL CITRATE (PF) 100 MCG/2ML IJ SOLN
INTRAMUSCULAR | Status: DC | PRN
  Administered 2023-08-24 (×3): 50 ug via INTRAVENOUS

## 2023-08-24 MED ORDER — SODIUM CHLORIDE 0.9 % IV SOLN: 12.5000 mg | Freq: Four times a day (QID) | INTRAVENOUS | Status: DC | PRN

## 2023-08-24 MED ORDER — CEFAZOLIN SODIUM-DEXTROSE 2-4 GM/100ML-% IV SOLN
2.0000 g | Freq: Four times a day (QID) | INTRAVENOUS | Status: AC
  Administered 2023-08-24 – 2023-08-25 (×2): 2 g via INTRAVENOUS
  Filled 2023-08-24 (×2): qty 100

## 2023-08-24 MED ORDER — DIPHENHYDRAMINE HCL 12.5 MG/5ML PO ELIX: 12.5000 mg | ORAL_SOLUTION | ORAL | Status: DC | PRN

## 2023-08-24 MED ORDER — MORPHINE SULFATE (PF) 2 MG/ML IV SOLN
1.0000 mg | INTRAVENOUS | Status: DC | PRN
  Administered 2023-08-24: 1 mg via INTRAVENOUS
  Administered 2023-08-25 – 2023-08-26 (×3): 2 mg via INTRAVENOUS
  Filled 2023-08-24 (×4): qty 1

## 2023-08-24 MED ORDER — BUPIVACAINE LIPOSOME 1.3 % IJ SUSP: 20.0000 mL | Freq: Once | INTRAMUSCULAR | Status: DC

## 2023-08-24 MED ORDER — ACETAMINOPHEN 500 MG PO TABS
1000.0000 mg | ORAL_TABLET | Freq: Four times a day (QID) | ORAL | Status: AC
  Administered 2023-08-25 (×4): 1000 mg via ORAL
  Filled 2023-08-24 (×4): qty 2

## 2023-08-24 MED ORDER — SODIUM CHLORIDE 0.9 % IV SOLN: INTRAVENOUS | Status: DC

## 2023-08-24 MED ORDER — GABAPENTIN 100 MG PO CAPS
100.0000 mg | ORAL_CAPSULE | Freq: Every day | ORAL | Status: DC
  Administered 2023-08-24 – 2023-08-25 (×2): 100 mg via ORAL
  Filled 2023-08-24 (×2): qty 1

## 2023-08-24 MED ORDER — MIDAZOLAM HCL 2 MG/2ML IJ SOLN
1.0000 mg | Freq: Once | INTRAMUSCULAR | Status: AC
  Administered 2023-08-24: 1 mg via INTRAVENOUS
  Filled 2023-08-24: qty 2

## 2023-08-24 MED ORDER — FLUTICASONE PROPIONATE 50 MCG/ACT NA SUSP: 2.0000 | Freq: Every day | NASAL | Status: DC | PRN

## 2023-08-24 MED ORDER — PHENTERMINE HCL 37.5 MG PO TABS: 37.5000 mg | ORAL_TABLET | Freq: Every day | ORAL | Status: DC

## 2023-08-24 MED ORDER — LIDOCAINE HCL (PF) 2 % IJ SOLN
INTRAMUSCULAR | Status: AC
  Filled 2023-08-24: qty 5

## 2023-08-24 MED ORDER — BISACODYL 10 MG RE SUPP: 10.0000 mg | Freq: Every day | RECTAL | Status: DC | PRN

## 2023-08-24 MED ORDER — DEXAMETHASONE SODIUM PHOSPHATE 4 MG/ML IJ SOLN
INTRAMUSCULAR | Status: DC | PRN
Start: 2023-08-24 — End: 2023-08-24
  Administered 2023-08-24: 5 mg via PERINEURAL

## 2023-08-24 MED ORDER — FENTANYL CITRATE (PF) 250 MCG/5ML IJ SOLN
INTRAMUSCULAR | Status: AC
Start: 2023-08-24 — End: ?
  Filled 2023-08-24: qty 5

## 2023-08-24 MED ORDER — CEFAZOLIN SODIUM-DEXTROSE 2-4 GM/100ML-% IV SOLN
2.0000 g | INTRAVENOUS | Status: AC
  Administered 2023-08-24: 2 g via INTRAVENOUS
  Filled 2023-08-24: qty 100

## 2023-08-24 MED ORDER — CHLORHEXIDINE GLUCONATE 4 % EX SOLN: 1.0000 | CUTANEOUS | 1 refills | Status: DC

## 2023-08-24 MED ORDER — ROPINIROLE HCL 1 MG PO TABS
1.0000 mg | ORAL_TABLET | Freq: Every day | ORAL | Status: DC
Start: 2023-08-25 — End: 2023-08-26
  Administered 2023-08-25 – 2023-08-26 (×2): 1 mg via ORAL
  Filled 2023-08-24 (×2): qty 1

## 2023-08-24 MED ORDER — HYDROMORPHONE HCL 2 MG PO TABS
2.0000 mg | ORAL_TABLET | ORAL | Status: DC | PRN
  Administered 2023-08-24: 2 mg via ORAL
  Administered 2023-08-25 – 2023-08-26 (×6): 4 mg via ORAL
  Filled 2023-08-24 (×2): qty 2
  Filled 2023-08-24: qty 1
  Filled 2023-08-24 (×4): qty 2

## 2023-08-24 MED ORDER — MIDAZOLAM HCL 5 MG/5ML IJ SOLN
INTRAMUSCULAR | Status: DC | PRN
  Administered 2023-08-24: 2 mg via INTRAVENOUS

## 2023-08-24 MED ORDER — STERILE WATER FOR IRRIGATION IR SOLN
Status: DC | PRN
  Administered 2023-08-24: 1000 mL

## 2023-08-24 MED ORDER — ACETAMINOPHEN 325 MG PO TABS: 325.0000 mg | ORAL_TABLET | Freq: Four times a day (QID) | ORAL | Status: DC | PRN

## 2023-08-24 MED ORDER — FLEET ENEMA RE ENEM: 1.0000 | ENEMA | Freq: Once | RECTAL | Status: DC | PRN

## 2023-08-24 MED ORDER — PROPOFOL 10 MG/ML IV BOLUS
INTRAVENOUS | Status: AC
  Filled 2023-08-24: qty 20

## 2023-08-24 MED ORDER — THYROID 30 MG PO TABS
15.0000 mg | ORAL_TABLET | Freq: Every day | ORAL | Status: DC
  Administered 2023-08-25 – 2023-08-26 (×2): 15 mg via ORAL
  Filled 2023-08-24 (×2): qty 1

## 2023-08-24 MED ORDER — POLYETHYLENE GLYCOL 3350 17 G PO PACK: 17.0000 g | PACK | Freq: Every day | ORAL | Status: DC | PRN

## 2023-08-24 MED ORDER — METHOCARBAMOL 500 MG PO TABS
500.0000 mg | ORAL_TABLET | Freq: Four times a day (QID) | ORAL | Status: DC | PRN
  Administered 2023-08-24 – 2023-08-26 (×3): 500 mg via ORAL
  Filled 2023-08-24 (×3): qty 1

## 2023-08-24 MED ORDER — MENTHOL 3 MG MT LOZG: 1.0000 | LOZENGE | OROMUCOSAL | Status: DC | PRN

## 2023-08-24 MED ORDER — LACTATED RINGERS IV SOLN: INTRAVENOUS | Status: DC

## 2023-08-24 MED ORDER — ROCURONIUM BROMIDE 10 MG/ML (PF) SYRINGE
PREFILLED_SYRINGE | INTRAVENOUS | Status: AC
  Filled 2023-08-24: qty 10

## 2023-08-24 MED ORDER — MIDAZOLAM HCL 2 MG/2ML IJ SOLN
INTRAMUSCULAR | Status: AC
  Filled 2023-08-24: qty 2

## 2023-08-24 MED ORDER — DROPERIDOL 2.5 MG/ML IJ SOLN: 0.6250 mg | Freq: Once | INTRAMUSCULAR | Status: DC | PRN

## 2023-08-24 MED ORDER — FENTANYL CITRATE PF 50 MCG/ML IJ SOSY
50.0000 ug | PREFILLED_SYRINGE | Freq: Once | INTRAMUSCULAR | Status: AC
  Administered 2023-08-24: 50 ug via INTRAVENOUS
  Filled 2023-08-24: qty 2

## 2023-08-24 MED ORDER — ACETAMINOPHEN 10 MG/ML IV SOLN
1000.0000 mg | Freq: Four times a day (QID) | INTRAVENOUS | Status: DC
  Administered 2023-08-24: 1000 mg via INTRAVENOUS
  Filled 2023-08-24: qty 100

## 2023-08-24 MED ORDER — ROPINIROLE HCL 1 MG PO TABS: 2.0000 mg | ORAL_TABLET | ORAL | Status: DC

## 2023-08-24 MED ORDER — SODIUM CHLORIDE 0.9 % IR SOLN
Status: DC | PRN
  Administered 2023-08-24: 2000 mL

## 2023-08-24 MED ORDER — ALBUTEROL SULFATE (2.5 MG/3ML) 0.083% IN NEBU: 2.5000 mg | INHALATION_SOLUTION | RESPIRATORY_TRACT | Status: DC | PRN

## 2023-08-24 SURGICAL SUPPLY — 54 items
BAG COUNTER SPONGE SURGICOUNT (BAG) IMPLANT
BAG ZIPLOCK 12X15 (MISCELLANEOUS) ×1 IMPLANT
BANDAGE ESMARK 6X9 LF (GAUZE/BANDAGES/DRESSINGS) ×1 IMPLANT
BIT DRILL 2.4X128 (BIT) ×1 IMPLANT
BLADE SAG 18X100X1.27 (BLADE) ×1 IMPLANT
BLADE SAW SGTL 11.0X1.19X90.0M (BLADE) ×1 IMPLANT
BNDG ELASTIC 6INX 5YD STR LF (GAUZE/BANDAGES/DRESSINGS) ×1 IMPLANT
BNDG ESMARK 6X9 LF (GAUZE/BANDAGES/DRESSINGS) IMPLANT
COVER SURGICAL LIGHT HANDLE (MISCELLANEOUS) ×1 IMPLANT
CUFF TRNQT CYL 34X4.125X (TOURNIQUET CUFF) ×1 IMPLANT
DERMABOND ADVANCED .7 DNX12 (GAUZE/BANDAGES/DRESSINGS) ×1 IMPLANT
DRAPE INCISE IOBAN 66X45 STRL (DRAPES) IMPLANT
DRAPE U-SHAPE 47X51 STRL (DRAPES) ×1 IMPLANT
DRSG AQUACEL AG ADV 3.5X10 (GAUZE/BANDAGES/DRESSINGS) ×1 IMPLANT
DURAPREP 26ML APPLICATOR (WOUND CARE) ×1 IMPLANT
ELECT REM PT RETURN 15FT ADLT (MISCELLANEOUS) ×1 IMPLANT
EVACUATOR 1/8 PVC DRAIN (DRAIN) IMPLANT
GAUZE SPONGE 4X4 12PLY STRL (GAUZE/BANDAGES/DRESSINGS) ×1 IMPLANT
GLOVE BIO SURGEON STRL SZ 6.5 (GLOVE) ×2 IMPLANT
GLOVE BIO SURGEON STRL SZ7 (GLOVE) IMPLANT
GLOVE BIO SURGEON STRL SZ7.5 (GLOVE) ×1 IMPLANT
GLOVE BIO SURGEON STRL SZ8 (GLOVE) ×1 IMPLANT
GLOVE BIOGEL PI IND STRL 6.5 (GLOVE) ×1 IMPLANT
GLOVE BIOGEL PI IND STRL 7.0 (GLOVE) ×1 IMPLANT
GLOVE BIOGEL PI IND STRL 8 (GLOVE) ×1 IMPLANT
GOWN SPEC L4 XLG W/TWL (GOWN DISPOSABLE) ×1 IMPLANT
GOWN STRL REUS W/ TWL LRG LVL3 (GOWN DISPOSABLE) ×3 IMPLANT
IMMOBILIZER KNEE 20 (SOFTGOODS) ×1 IMPLANT
IMMOBILIZER KNEE 20 THIGH 36 (SOFTGOODS) ×1 IMPLANT
INSERT TIB CRS ATTUNE SZ4 18 (Insert) IMPLANT
KIT TURNOVER KIT A (KITS) IMPLANT
MANIFOLD NEPTUNE II (INSTRUMENTS) ×1 IMPLANT
NDL MAYO CATGUT SZ4 TPR NDL (NEEDLE) IMPLANT
NEEDLE MAYO CATGUT SZ4 (NEEDLE) ×1 IMPLANT
NS IRRIG 1000ML POUR BTL (IV SOLUTION) ×1 IMPLANT
PACK TOTAL JOINT (CUSTOM PROCEDURE TRAY) ×1 IMPLANT
PACK TOTAL KNEE CUSTOM (KITS) IMPLANT
PADDING CAST COTTON 6X4 STRL (CAST SUPPLIES) ×2 IMPLANT
PROTECTOR NERVE ULNAR (MISCELLANEOUS) ×1 IMPLANT
SET HNDPC FAN SPRY TIP SCT (DISPOSABLE) ×1 IMPLANT
SUT ETHIBOND NAB CT1 #1 30IN (SUTURE) ×2 IMPLANT
SUT MNCRL AB 4-0 PS2 18 (SUTURE) ×1 IMPLANT
SUT STRATAFIX 0 PDS 27 VIOLET (SUTURE) ×1 IMPLANT
SUT VIC AB 0 CT1 36 (SUTURE) ×1 IMPLANT
SUT VIC AB 1 CT1 27XBRD ANTBC (SUTURE) IMPLANT
SUT VIC AB 2-0 CT1 TAPERPNT 27 (SUTURE) ×3 IMPLANT
SUTURE STRATFX 0 PDS 27 VIOLET (SUTURE) ×1 IMPLANT
SWAB COLLECTION DEVICE MRSA (MISCELLANEOUS) ×1 IMPLANT
SWAB CULTURE ESWAB REG 1ML (MISCELLANEOUS) ×1 IMPLANT
TOWEL OR 17X26 10 PK STRL BLUE (TOWEL DISPOSABLE) ×1 IMPLANT
TOWER CARTRIDGE SMART MIX (DISPOSABLE) ×1 IMPLANT
TRAY FOLEY MTR SLVR 16FR STAT (SET/KITS/TRAYS/PACK) IMPLANT
WATER STERILE IRR 1000ML POUR (IV SOLUTION) ×1 IMPLANT
WRAP KNEE MAXI GEL POST OP (GAUZE/BANDAGES/DRESSINGS) ×1 IMPLANT

## 2023-08-24 NOTE — Anesthesia Postprocedure Evaluation (Signed)
 Anesthesia Post Note  Patient: [name]  Procedure(s) Performed: REPAIR, TENDON, PATELLAR (Left: Knee) POLYETHYLENE REVISION (Left: Knee)     Patient location during evaluation: PACU Anesthesia Type: General Level of consciousness: sedated and patient cooperative Pain management: pain level controlled Vital Signs Assessment: post-procedure vital signs reviewed and stable Respiratory status: spontaneous breathing Cardiovascular status: stable Anesthetic complications: no   No notable events documented.  Last Vitals:  Vitals:   08/24/23 1845 08/24/23 1904  BP: 118/69 116/74  Pulse: 97 98  Resp: 13 17  Temp:  36.9 C  SpO2: 94% 98%    Last Pain:  Vitals:   08/24/23 1904  TempSrc: Oral  PainSc:                  Gorman Laughter

## 2023-08-24 NOTE — Brief Op Note (Signed)
 08/24/2023  5:09 PM  PATIENT:  [name]  68 y.o. female  PRE-OPERATIVE DIAGNOSIS:  Left Patella tendon rupture  POST-OPERATIVE DIAGNOSIS:  Left Patella tendon rupture  PROCEDURE:  Procedure(s): REPAIR, TENDON, PATELLAR (Left) POLYETHYLENE REVISION (Left)  SURGEON:  Surgeons and Role:    Liliane Rei, MD - Primary  PHYSICIAN ASSISTANT:   ASSISTANTS: Sharlynn Dear, PA-C   ANESTHESIA:   regional and spinal  EBL:  50 mL   BLOOD ADMINISTERED:none  DRAINS: none   LOCAL MEDICATIONS USED:  NONE  COUNTS:  YES  TOURNIQUET:   Total Tourniquet Time Documented: area (laterality) - 28 minutes Total: area (laterality) - 28 minutes   DICTATION: .Other Dictation: Dictation Number [id number]  PLAN OF CARE: Admit to inpatient   PATIENT DISPOSITION:  PACU - hemodynamically stable.

## 2023-08-24 NOTE — Anesthesia Procedure Notes (Addendum)
 Spinal  Patient location during procedure: OR Start time: 08/24/2023 3:58 PM End time: 08/24/2023 4:15 PM Reason for block: surgical anesthesia Staffing Performed: anesthesiologist  Anesthesiologist: Gorman Laughter, MD Performed by: Gorman Laughter, MD Authorized by: Gorman Laughter, MD   Preanesthetic Checklist Completed: patient identified, IV checked, site marked, risks and benefits discussed, surgical consent, monitors and equipment checked, pre-op evaluation and timeout performed Spinal Block Patient position: sitting Prep: DuraPrep and site prepped and draped Patient monitoring: heart rate, continuous pulse ox and blood pressure Approach: right paramedian Location: L3-4 Injection technique: single-shot Needle Needle type: Spinocan  Needle gauge: 25 G Needle length: 9 cm Additional Notes Expiration date of kit checked and confirmed. Patient tolerated procedure well, without complications.

## 2023-08-24 NOTE — Anesthesia Preprocedure Evaluation (Addendum)
 Anesthesia Evaluation  Patient identified by MRN, date of birth, ID band Patient awake    Reviewed: Allergy & Precautions, NPO status , Patient's Chart, lab work & pertinent test results  History of Anesthesia Complications (+) PONV and history of anesthetic complications  Airway Mallampati: II  TM Distance: >3 FB Neck ROM: Full    Dental no notable dental hx. (+) Teeth Intact, Caps   Pulmonary asthma , neg recent URI   Pulmonary exam normal breath sounds clear to auscultation       Cardiovascular hypertension, Pt. on medications Normal cardiovascular exam Rhythm:Regular Rate:Normal     Neuro/Psych  PSYCHIATRIC DISORDERS Anxiety Depression    negative neurological ROS     GI/Hepatic Neg liver ROS,GERD  Controlled,,  Endo/Other  Hypothyroidism    Renal/GU      Musculoskeletal  (+) Arthritis , Osteoarthritis,  Unstable left knee arthroplasty Levo scoliosis lumbar spine   Abdominal   Peds  Hematology negative hematology ROS (+) Lab Results      Component                Value               Date                            HGB                      14.2                04/14/2022                HCT                      43.5                04/14/2022                PLT                      269                 04/14/2022              Anesthesia Other Findings   Reproductive/Obstetrics                             Anesthesia Physical Anesthesia Plan  ASA: 3  Anesthesia Plan:    Post-op Pain Management: Regional block* and Tylenol PO (pre-op)*   Induction: Intravenous  PONV Risk Score and Plan: 4 or greater and Propofol infusion, TIVA, Treatment may vary due to age or medical condition, Ondansetron and Dexamethasone  Airway Management Planned:   Additional Equipment: None  Intra-op Plan:   Post-operative Plan:   Informed Consent:   Plan Discussed with:   Anesthesia Plan  Comments:         Anesthesia Quick Evaluation

## 2023-08-24 NOTE — Discharge Instructions (Signed)
 Ollen Gross, MD Total Joint Specialist [facility] [place] [address] [place], [place] [address] [phone]  TOTAL KNEE REPLACEMENT POSTOPERATIVE DIRECTIONS  Knee Rehabilitation, Guidelines Following Surgery  Results after knee surgery are often greatly improved when you follow the exercise, range of motion and muscle strengthening exercises prescribed by your doctor. Safety measures are also important to protect the knee from further injury. If any of these exercises cause you to have increased pain or swelling in your knee joint, decrease the amount until you are comfortable again and slowly increase them. If you have problems or questions, call your caregiver or physical therapist for advice.   **MAINTAIN KNEE IMMOBILIZER AT ALL TIMES**  HOME CARE INSTRUCTIONS  Remove items at home which could result in a fall. This includes throw rugs or furniture in walking pathways.  ICE to the affected knee as much as tolerated. Icing helps control swelling. If the swelling is well controlled you will be more comfortable and rehab easier. Continue to use ice on the knee for pain and swelling from surgery. You may notice swelling that will progress down to the foot and ankle. This is normal after surgery. Elevate the leg when you are not up walking on it.    Continue to use the breathing machine which will help keep your temperature down. It is common for your temperature to cycle up and down following surgery, especially at night when you are not up moving around and exerting yourself. The breathing machine keeps your lungs expanded and your temperature down. Do not place pillow under the operative knee, focus on keeping the knee straight while resting  DIET You may resume your previous home diet once you are discharged from the hospital.  DRESSING / WOUND CARE / SHOWERING Keep your bulky bandage on for 2 days. On the third post-operative day you may remove the Ace  bandage and gauze. There is a waterproof adhesive bandage on your skin which will stay in place until your first follow-up appointment. Once you remove this you will not need to place another bandage You may begin showering 3 days following surgery, but do not submerge the incision under water.  ACTIVITY For the first 5 days, the key is rest and control of pain and swelling Do your home exercises twice a day starting on post-operative day 3. On the days you go to physical therapy, just do the home exercises once that day. You should rest, ice and elevate the leg for 50 minutes out of every hour. Get up and walk/stretch for 10 minutes per hour. After 5 days you can increase your activity slowly as tolerated. Walk with your walker as instructed. Use the walker until you are comfortable transitioning to a cane. Walk with the cane in the opposite hand of the operative leg. You may discontinue the cane once you are comfortable and walking steadily. Avoid periods of inactivity such as sitting longer than an hour when not asleep. This helps prevent blood clots.  You may discontinue the knee immobilizer once you are able to perform a straight leg raise while lying down. You may resume a sexual relationship in one month or when given the OK by your doctor.  You may return to work once you are cleared by your doctor.  Do not drive a car for 6 weeks or until released by your surgeon.  Do not drive while taking narcotics.  TED HOSE STOCKINGS Wear the elastic stockings on both legs for three  weeks following surgery during the day. You may remove them at night for sleeping.  WEIGHT BEARING Weight bearing as tolerated with assist device (walker, cane, etc) as directed, use it as long as suggested by your surgeon or therapist, typically at least 4-6 weeks.  POSTOPERATIVE CONSTIPATION PROTOCOL Constipation - defined medically as fewer than three stools per week and severe constipation as less than one stool per  week.  One of the most common issues patients have following surgery is constipation.  Even if you have a regular bowel pattern at home, your normal regimen is likely to be disrupted due to multiple reasons following surgery.  Combination of anesthesia, postoperative narcotics, change in appetite and fluid intake all can affect your bowels.  In order to avoid complications following surgery, here are some recommendations in order to help you during your recovery period.  Colace (docusate) - Pick up an over-the-counter form of Colace or another stool softener and take twice a day as long as you are requiring postoperative pain medications.  Take with a full glass of water daily.  If you experience loose stools or diarrhea, hold the colace until you stool forms back up. If your symptoms do not get better within 1 week or if they get worse, check with your doctor. Dulcolax (bisacodyl) - Pick up over-the-counter and take as directed by the product packaging as needed to assist with the movement of your bowels.  Take with a full glass of water.  Use this product as needed if not relieved by Colace only.  MiraLax (polyethylene glycol) - Pick up over-the-counter to have on hand. MiraLax is a solution that will increase the amount of water in your bowels to assist with bowel movements.  Take as directed and can mix with a glass of water, juice, soda, coffee, or tea. Take if you go more than two days without a movement. Do not use MiraLax more than once per day. Call your doctor if you are still constipated or irregular after using this medication for 7 days in a row.  If you continue to have problems with postoperative constipation, please contact the office for further assistance and recommendations.  If you experience "the worst abdominal pain ever" or develop nausea or vomiting, please contact the office immediatly for further recommendations for treatment.  ITCHING If you experience itching with your  medications, try taking only a single pain pill, or even half a pain pill at a time.  You can also use Benadryl over the counter for itching or also to help with sleep.   MEDICATIONS See your medication summary on the "After Visit Summary" that the nursing staff will review with you prior to discharge.  You may have some home medications which will be placed on hold until you complete the course of blood thinner medication.  It is important for you to complete the blood thinner medication as prescribed by your surgeon.  Continue your approved medications as instructed at time of discharge.  PRECAUTIONS If you experience chest pain or shortness of breath - call 911 immediately for transfer to the hospital emergency department.  If you develop a fever greater that 101 F, purulent drainage from wound, increased redness or drainage from wound, foul odor from the wound/dressing, or calf pain - CONTACT YOUR SURGEON.  FOLLOW-UP APPOINTMENTS Make sure you keep all of your appointments after your operation with your surgeon and caregivers. You should call the office at the above phone number and make an appointment for approximately two weeks after the date of your surgery or on the date instructed by your surgeon outlined in the "After Visit Summary".  RANGE OF MOTION AND STRENGTHENING EXERCISES  Rehabilitation of the knee is important following a knee injury or an operation. After just a few days of immobilization, the muscles of the thigh which control the knee become weakened and shrink (atrophy). Knee exercises are designed to build up the tone and strength of the thigh muscles and to improve knee motion. Often times heat used for twenty to thirty minutes before working out will loosen up your tissues and help with improving the range of motion but do not use heat for the first two weeks following surgery. These exercises can be done on a training (exercise)  mat, on the floor, on a table or on a bed. Use what ever works the best and is most comfortable for you. Knee exercises include:  Leg Lifts - While your knee is still immobilized in a splint or cast, you can do straight leg raises. Lift the leg to 60 degrees, hold for 3 sec, and slowly lower the leg. Repeat 10-20 times 2-3 times daily. Perform this exercise against resistance later as your knee gets better.  Quad and Hamstring Sets - Tighten up the muscle on the front of the thigh (Quad) and hold for 5-10 sec. Repeat this 10-20 times hourly. Hamstring sets are done by pushing the foot backward against an object and holding for 5-10 sec. Repeat as with quad sets.  A rehabilitation program following serious knee injuries can speed recovery and prevent re-injury in the future due to weakened muscles. Contact your doctor or a physical therapist for more information on knee rehabilitation.   POST-OPERATIVE OPIOID TAPER INSTRUCTIONS: It is important to wean off of your opioid medication as soon as possible. If you do not need pain medication after your surgery it is ok to stop day one. Opioids include: Codeine, Hydrocodone(Norco, Vicodin), Oxycodone(Percocet, oxycontin) and hydromorphone amongst others.  Long term and even short term use of opiods can cause: Increased pain response Dependence Constipation Depression Respiratory depression And more.  Withdrawal symptoms can include Flu like symptoms Nausea, vomiting And more Techniques to manage these symptoms Hydrate well Eat regular healthy meals Stay active Use relaxation techniques(deep breathing, meditating, yoga) Do Not substitute Alcohol to help with tapering If you have been on opioids for less than two weeks and do not have pain than it is ok to stop all together.  Plan to wean off of opioids This plan should start within one week post op of your joint replacement. Maintain the same interval or time between taking each dose and first  decrease the dose.  Cut the total daily intake of opioids by one tablet each day Next start to increase the time between doses. The last dose that should be eliminated is the evening dose.   IF YOU ARE TRANSFERRED TO A SKILLED REHAB FACILITY If the patient is transferred to a skilled rehab facility following release from the hospital, a list of the current medications will be sent to the facility for the patient to continue.  When discharged from the skilled rehab facility, please have the facility set up the patient's Home Health Physical Therapy prior to being released. Also, the skilled facility will  be responsible for providing the patient with their medications at time of release from the facility to include their pain medication, the muscle relaxants, and their blood thinner medication. If the patient is still at the rehab facility at time of the two week follow up appointment, the skilled rehab facility will also need to assist the patient in arranging follow up appointment in our office and any transportation needs.  MAKE SURE YOU:  Understand these instructions.  Get help right away if you are not doing well or get worse.   DENTAL ANTIBIOTICS:  In most cases prophylactic antibiotics for Dental procdeures after total joint surgery are not necessary.  Exceptions are as follows:  1. History of prior total joint infection  2. Severely immunocompromised (Organ Transplant, cancer chemotherapy, Rheumatoid biologic medications such as Humera)  3. Poorly controlled diabetes (A1C &gt; 8.0, blood glucose over 200)  If you have one of these conditions, contact your surgeon for an antibiotic prescription, prior to your dental procedure.    Pick up stool softner and laxative for home use following surgery while on pain medications. Do not submerge incision under water. Please use good hand washing techniques while changing dressing each day. May shower starting three days after  surgery. Please use a clean towel to pat the incision dry following showers. Continue to use ice for pain and swelling after surgery. Do not use any lotions or creams on the incision until instructed by your surgeon.

## 2023-08-24 NOTE — Anesthesia Procedure Notes (Deleted)
Spinal  Patient location during procedure: OR Reason for block: surgical anesthesia Staffing Performed: anesthesiologist  Anesthesiologist: Lewie Loron, MD Performed by: Lewie Loron, MD Authorized by: Lewie Loron, MD   Preanesthetic Checklist Completed: patient identified, IV checked, site marked, risks and benefits discussed, surgical consent, monitors and equipment checked, pre-op evaluation and timeout performed Spinal Block Prep: DuraPrep and site prepped and draped Patient monitoring: heart rate, continuous pulse ox and blood pressure Injection technique: single-shot Needle Needle type: Spinocan  Needle gauge: 25 G Needle length: 9 cm Additional Notes Expiration date of kit checked and confirmed. Patient tolerated procedure well, without complications.

## 2023-08-24 NOTE — Transfer of Care (Signed)
 Immediate Anesthesia Transfer of Care Note  Patient: [name]  Procedure(s) Performed: REPAIR, TENDON, PATELLAR (Left: Knee) POLYETHYLENE REVISION (Left: Knee)  Patient Location: PACU  Anesthesia Type:MAC  Level of Consciousness: awake and alert   Airway & Oxygen Therapy: Patient Spontanous Breathing and Patient connected to face mask oxygen  Post-op Assessment: Report given to RN and Post -op Vital signs reviewed and stable  Post vital signs: Reviewed and stable  Last Vitals:  Vitals Value Taken Time  BP    Temp    Pulse 89 08/24/23 1744  Resp 15 08/24/23 1744  SpO2 100 % 08/24/23 1744  Vitals shown include unfiled device data.  Last Pain:  Vitals:   08/24/23 1515  TempSrc:   PainSc: 0-No pain         Complications: No notable events documented.

## 2023-08-24 NOTE — Interval H&P Note (Signed)
 History and Physical Interval Note:  08/24/2023 2:21 PM  [name]  has presented today for surgery, with the diagnosis of Left Patella tendon rupture.  The various methods of treatment have been discussed with the patient and family. After consideration of risks, benefits and other options for treatment, the patient has consented to  Procedure(s): REPAIR, TENDON, PATELLAR (Left) REVISION, TOTAL ARTHROPLASTY, KNEE (Left) as a surgical intervention.  The patient's history has been reviewed, patient examined, no change in status, stable for surgery.  I have reviewed the patient's chart and labs.  Questions were answered to the patient's satisfaction.     Samuel Crock Tifanie Gardiner

## 2023-08-24 NOTE — Anesthesia Procedure Notes (Signed)
 Anesthesia Regional Block: Adductor canal block   Pre-Anesthetic Checklist: , timeout performed,  Correct Patient, Correct Site, Correct Laterality,  Correct Procedure, Correct Position, site marked,  Risks and benefits discussed,  Surgical consent,  Pre-op evaluation,  At surgeon's request and post-op pain management  Laterality: Lower and Left  Prep: chloraprep       Needles:  Injection technique: Single-shot  Needle Type: Stimiplex     Needle Length: 9cm  Needle Gauge: 21     Additional Needles:   Procedures:,,,, ultrasound used (permanent image in chart),,    Narrative:  Start time: 08/24/2023 2:50 PM End time: 08/24/2023 3:10 PM Injection made incrementally with aspirations every 5 mL.  Performed by: Personally  Anesthesiologist: Gorman Laughter, MD  Additional Notes: BP cuff, EKG monitors applied. Sedation begun. Artery and nerve location verified with ultrasound. Anesthetic injected incrementally (5ml), slowly, and after negative aspirations under direct u/s guidance. Good fascial/perineural spread. Tolerated well.

## 2023-08-25 ENCOUNTER — Encounter (HOSPITAL_COMMUNITY): Payer: Self-pay | Admitting: Orthopedic Surgery

## 2023-08-25 ENCOUNTER — Other Ambulatory Visit (HOSPITAL_COMMUNITY): Payer: Self-pay

## 2023-08-25 LAB — BASIC METABOLIC PANEL WITH GFR
Anion gap: 10 (ref 5–15)
BUN: 16 mg/dL (ref 8–23)
CO2: 26 mmol/L (ref 22–32)
Calcium: 8.4 mg/dL — ABNORMAL LOW (ref 8.9–10.3)
Chloride: 101 mmol/L (ref 98–111)
Creatinine, Ser: 0.79 mg/dL (ref 0.44–1.00)
GFR, Estimated: >60 mL/min
Glucose, Bld: 148 mg/dL — ABNORMAL HIGH (ref 70–99)
Potassium: 4.1 mmol/L (ref 3.5–5.1)
Sodium: 137 mmol/L (ref 135–145)

## 2023-08-25 LAB — CBC
HCT: 35.9 % — ABNORMAL LOW (ref 36.0–46.0)
Hemoglobin: 11.6 g/dL — ABNORMAL LOW (ref 12.0–15.0)
MCH: 30.9 pg (ref 26.0–34.0)
MCHC: 32.3 g/dL (ref 30.0–36.0)
MCV: 95.7 fL (ref 80.0–100.0)
Platelets: 277 10*3/uL (ref 150–400)
RBC: 3.75 MIL/uL — ABNORMAL LOW (ref 3.87–5.11)
RDW: 12.6 % (ref 11.5–15.5)
WBC: 6.3 10*3/uL (ref 4.0–10.5)
nRBC: 0 % (ref 0.0–0.2)

## 2023-08-25 MED ORDER — TRAMADOL HCL 50 MG PO TABS
50.0000 mg | ORAL_TABLET | Freq: Four times a day (QID) | ORAL | 0 refills | Status: DC | PRN
Start: 2023-08-25 — End: 2023-08-26
  Filled 2023-08-25: qty 30, 4d supply, fill #0

## 2023-08-25 MED ORDER — METHOCARBAMOL 500 MG PO TABS
500.0000 mg | ORAL_TABLET | Freq: Four times a day (QID) | ORAL | 0 refills | Status: DC | PRN
  Filled 2023-08-25: qty 40, 10d supply, fill #0

## 2023-08-25 MED ORDER — HYDROMORPHONE HCL 2 MG PO TABS
2.0000 mg | ORAL_TABLET | Freq: Four times a day (QID) | ORAL | 0 refills | Status: DC | PRN
Start: 2023-08-25 — End: 2023-08-26
  Filled 2023-08-25: qty 42, 6d supply, fill #0

## 2023-08-25 NOTE — Progress Notes (Signed)
   Subjective: 1 Day Post-Op Procedure(s) (LRB): REPAIR, TENDON, PATELLAR (Left) POLYETHYLENE REVISION (Left) Patient reports pain as mild.   Patient seen in rounds by Dr. France Ina. Patient is doing well, no acute events overnight.  Plan is to go Home after hospital stay.  Objective: Vital signs in last 24 hours: Temp:  [97.6 F (36.4 C)-98.4 F (36.9 C)] 97.7 F (36.5 C) (04/17 0520) Pulse Rate:  [87-105] 93 (04/17 0520) Resp:  [13-19] 17 (04/17 0520) BP: (104-135)/(69-92) 135/89 (04/17 0520) SpO2:  [94 %-100 %] 100 % (04/17 0520) Weight:  [70.8 kg] 70.8 kg (04/16 1315)  Intake/Output from previous day:  Intake/Output Summary (Last 24 hours) at 08/25/2023 0753 Last data filed at 08/25/2023 0600 Gross per 24 hour  Intake 1332.64 ml  Output 3300 ml  Net -1967.36 ml    Intake/Output this shift: No intake/output data recorded.  Labs: Recent Labs    08/25/23 0328  HGB 11.6*   Recent Labs    08/25/23 0328  WBC 6.3  RBC 3.75*  HCT 35.9*  PLT 277   Recent Labs    08/25/23 0328  NA 137  K 4.1  CL 101  CO2 26  BUN 16  CREATININE 0.79  GLUCOSE 148*  CALCIUM 8.4*   No results for input(s): "LABPT", "INR" in the last 72 hours.  Exam: General - Patient is Alert and Oriented Extremity - Neurologically intact Neurovascular intact Sensation intact distally Dorsiflexion/Plantar flexion intact Dressing/Incision - clean, dry, no drainage Motor Function - intact, moving foot and toes well on exam.   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   Weight as of this encounter: 70.8 kg. Advance diet Up with therapy  DVT Prophylaxis - Aspirin Weight-bearing as tolerated  Maintain KI at all times, no ROM to left knee. Discharge to home today with HHPT. Patient is requesting aide, will order but informed that insurance may not cover.  Will also put in order for wheelchair.  Follow-up in our office in 2 weeks.  The PDMP database was reviewed today prior to any opioid medications being prescribed to this patient.  Sharlynn Dear, PA-C Orthopedic Surgery [phone] 08/25/2023, 7:53 AM

## 2023-08-25 NOTE — TOC Transition Note (Signed)
 Transition of Care [facility]) - Discharge Note   Patient Details  Name: [name] MRN: [id number] Date of Birth: Dec 09, 1955  Transition of Care [facility]) CM/SW Contact:  Delilah Fend, LCSW Phone Number: 08/25/2023, 9:45 AM   Clinical Narrative:     Met with pt who confirms she has needed DME in the home.  OPPT already arranged with [facility] PT.  No further TOC needs.  Final next level of care: OP Rehab Barriers to Discharge: No Barriers Identified   Patient Goals and CMS Choice Patient states their goals for this hospitalization and ongoing recovery are:: return home          Discharge Placement                       Discharge Plan and Services Additional resources added to the After Visit Summary for                  DME Arranged: N/A DME Agency: NA                  Social Drivers of Health (SDOH) Interventions SDOH Screenings   Food Insecurity: No Food Insecurity (08/24/2023)  Housing: Low Risk  (08/24/2023)  Transportation Needs: No Transportation Needs (08/24/2023)  Utilities: At Risk (08/24/2023)  Financial Resource Strain: Low Risk  (09/23/2022)   Received from [facility], [facility]  Social Connections: Socially Isolated (08/24/2023)  Tobacco Use: Low Risk  (08/24/2023)     Readmission Risk Interventions     No data to display

## 2023-08-25 NOTE — Progress Notes (Signed)
 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  Pt up to ambulate limited distance in  hall and assisted back to bed.  Pt hopeful for dc home tomorrow.    If plan is discharge home, recommend the following: A little help with walking and/or transfers;A little help with bathing/dressing/bathroom;Assistance with cooking/housework;Assist for transportation;Help with stairs or ramp for entrance   Can travel by private vehicle        Equipment Recommendations  None recommended by PT    Recommendations for Other Services       Precautions / Restrictions Precautions Precautions: Fall Required Braces or Orthoses: Knee Immobilizer - Left Knee Immobilizer - Left: On at all times Restrictions Weight Bearing Restrictions Per Provider Order: Yes LLE Weight Bearing Per Provider Order: Weight bearing as tolerated Other Position/Activity Restrictions: with Ki in place     Mobility  Bed Mobility Overal bed mobility: Needs Assistance Bed Mobility: Supine to Sit, Sit to Supine     Supine to sit: Min assist Sit to supine: Contact guard assist   General bed mobility comments: cues for sequence with min assist to manage L LE off of bed but pt utilizing gait belt to bring L LE back onto bed    Transfers Overall transfer level: Needs assistance Equipment used: Rolling walker (2 wheels) Transfers: Sit to/from Stand Sit to Stand: Min assist, Contact guard assist           General transfer comment: cues for LE management and use of UEs to self assist    Ambulation/Gait Ambulation/Gait assistance: Min assist, Contact guard  assist Gait Distance (Feet): 50 Feet Assistive device: Rolling walker (2 wheels) Gait Pattern/deviations: Step-to pattern, Decreased step length - right, Decreased step length - left, Shuffle, Trunk flexed Gait velocity: decr     General Gait Details: cues for sequence, posture and position from [employer]             Wheelchair Mobility     Tilt Bed    Modified Rankin (Stroke Patients Only)       Balance Overall balance assessment: Needs assistance Sitting-balance support: No upper extremity supported, Feet supported Sitting balance-Leahy Scale: Good     Standing balance support: Single extremity supported Standing balance-Leahy Scale: Poor                              Communication Communication Communication: No apparent difficulties  Cognition Arousal: Alert Behavior During Therapy: WFL for tasks assessed/performed, Anxious   PT - Cognitive impairments: No apparent impairments                         Following commands: Intact      Cueing Cueing Techniques: Verbal cues  Exercises Total Joint Exercises Ankle Circles/Pumps: AROM, Both, 15 reps, Supine    General Comments        Pertinent Vitals/Pain Pain Assessment Pain Assessment: 0-10 Pain Score: 5  Pain Location: L knee Pain Descriptors / Indicators: Aching, Sore Pain Intervention(s): Limited activity within patient's tolerance, Monitored  during session, Premedicated before session, Ice applied    Home Living Family/patient expects to be discharged to:: Private residence Living Arrangements: Alone Available Help at Discharge: Available 24 hours/day Type of Home: House Home Access: Stairs to enter Entrance Stairs-Rails: Doctor, general practice of Steps: 3   Home Layout: Able to live on main level with bedroom/bathroom Home Equipment: Agricultural consultant (2 wheels);Cane - single point;Crutches Additional Comments: Pt plans to stay with friends initially -  home details above refer to friends home    Prior Function            PT Goals (current goals can now be found in the care plan section) Acute Rehab PT Goals Patient Stated Goal: regain independence PT Goal Formulation: With patient Time For Goal Achievement: 08/10/23 Potential to Achieve Goals: Good Progress towards PT goals: Progressing toward goals    Frequency    7X/week      PT Plan      Co-evaluation              AM-PAC PT "6 Clicks" Mobility   Outcome Measure  Help needed turning from your back to your side while in a flat bed without using bedrails?: A Little Help needed moving from lying on your back to sitting on the side of a flat bed without using bedrails?: A Little Help needed moving to and from a bed to a chair (including a wheelchair)?: A Little Help needed standing up from a chair using your arms (e.g., wheelchair or bedside chair)?: A Little Help needed to walk in hospital room?: A Little Help needed climbing 3-5 steps with a railing? : A Lot 6 Click Score: 17    End of Session Equipment Utilized During Treatment: Gait belt;Left knee immobilizer Activity Tolerance: Patient tolerated treatment well Patient left: with call bell/phone within reach;in bed;with bed alarm set Nurse Communication: Mobility status PT Visit Diagnosis: Other abnormalities of gait and mobility (R26.89);Pain Pain - Right/Left: Left Pain - part of body: Knee     Time: 1336-1400 PT Time Calculation (min) (ACUTE ONLY): 24 min  Charges:    $Gait Training: 8-22 mins PT General Charges $$ ACUTE PT VISIT: 1 Visit                     Thedora Finlay PT Acute Rehabilitation Services Pager [phone] Office [phone]    Athina Fahey 08/25/2023, 2:34 PM

## 2023-08-25 NOTE — Evaluation (Signed)
 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 Pt should progress to dc home with assist of friends.        If plan is discharge home, recommend the following: A little help with walking and/or transfers;A little help with bathing/dressing/bathroom;Assistance with cooking/housework;Assist for transportation;Help with stairs or ramp for entrance   Can travel by private vehicle        Equipment Recommendations None recommended by PT  Recommendations for Other Services       Functional Status Assessment Patient has had a recent decline in their functional status and demonstrates the ability to make significant improvements in function in a reasonable and predictable amount of time.     Precautions / Restrictions Precautions Precautions: Fall Required Braces or Orthoses: Knee Immobilizer - Left Knee Immobilizer - Left: On at all times Restrictions Weight Bearing Restrictions Per Provider Order: Yes LLE Weight Bearing Per Provider Order: Weight bearing as tolerated Other Position/Activity Restrictions: with Ki in place      Mobility  Bed Mobility Overal bed mobility: Needs Assistance Bed Mobility: Supine to Sit     Supine to sit: Min assist     General bed mobility comments: cues for sequence with min assist to manage L LE    Transfers Overall transfer level: Needs assistance Equipment used: Rolling walker (2 wheels) Transfers: Sit to/from Stand Sit to Stand: Min assist           General transfer comment: cues for LE management and use of UEs to self assist    Ambulation/Gait Ambulation/Gait assistance: Min  assist, Contact guard assist Gait Distance (Feet): 77 Feet Assistive device: Rolling walker (2 wheels) Gait Pattern/deviations: Step-to pattern, Decreased step length - right, Decreased step length - left, Shuffle, Trunk flexed Gait velocity: decr     General Gait Details: cues for sequence, posture and position from [employer]            Wheelchair Mobility     Tilt Bed    Modified Rankin (Stroke Patients Only)       Balance Overall balance assessment: Needs assistance Sitting-balance support: No upper extremity supported, Feet supported Sitting balance-Leahy Scale: Good     Standing balance support: Bilateral upper extremity supported, During functional activity, Reliant on assistive device for balance Standing balance-Leahy Scale: Poor                               Pertinent Vitals/Pain Pain Assessment Pain Assessment: 0-10 Pain Score: 5  Pain Location: L knee Pain Descriptors / Indicators: Aching, Sore Pain Intervention(s): Limited activity within patient's tolerance, Monitored during session, Premedicated before session, Ice applied    Home Living Family/patient expects to be discharged to:: Private residence Living Arrangements: Alone Available Help at Discharge: Available 24 hours/day Type of Home: House Home Access: Stairs to enter Entrance Stairs-Rails: Doctor, general practice of Steps: 3   Home Layout: Able to live on main level with bedroom/bathroom Home Equipment: Agricultural consultant (2 wheels);Cane - single point;Crutches Additional Comments: Pt plans to stay with friends initially - home details above refer to friends home  Prior Function Prior Level of Function : Independent/Modified Independent                     Extremity/Trunk Assessment   Upper Extremity Assessment Upper Extremity Assessment: Overall WFL for tasks assessed    Lower Extremity Assessment Lower Extremity Assessment: LLE  deficits/detail LLE Deficits / Details: KI in place LLE: Unable to fully assess due to immobilization    Cervical / Trunk Assessment Cervical / Trunk Assessment: Normal  Communication   Communication Communication: No apparent difficulties    Cognition Arousal: Alert Behavior During Therapy: WFL for tasks assessed/performed, Anxious   PT - Cognitive impairments: No apparent impairments                         Following commands: Intact       Cueing Cueing Techniques: Verbal cues     General Comments      Exercises Total Joint Exercises Ankle Circles/Pumps: AROM, Both, 15 reps, Supine   Assessment/Plan    PT Assessment Patient needs continued PT services  PT Problem List Decreased strength;Decreased range of motion;Decreased activity tolerance;Decreased balance;Decreased mobility;Decreased knowledge of use of DME;Pain       PT Treatment Interventions DME instruction;Gait training;Stair training;Functional mobility training;Therapeutic activities;Therapeutic exercise;Patient/family education;Balance training    PT Goals (Current goals can be found in the Care Plan section)  Acute Rehab PT Goals Patient Stated Goal: regain independence PT Goal Formulation: With patient Time For Goal Achievement: 08/10/23 Potential to Achieve Goals: Good    Frequency 7X/week     Co-evaluation               AM-PAC PT "6 Clicks" Mobility  Outcome Measure Help needed turning from your back to your side while in a flat bed without using bedrails?: A Little Help needed moving from lying on your back to sitting on the side of a flat bed without using bedrails?: A Little Help needed moving to and from a bed to a chair (including a wheelchair)?: A Little Help needed standing up from a chair using your arms (e.g., wheelchair or bedside chair)?: A Little Help needed to walk in hospital room?: A Little Help needed climbing 3-5 steps with a railing? : A Lot 6 Click Score:  17    End of Session Equipment Utilized During Treatment: Gait belt;Left knee immobilizer Activity Tolerance: Patient tolerated treatment well Patient left: with call bell/phone within reach;in chair Nurse Communication: Mobility status PT Visit Diagnosis: Other abnormalities of gait and mobility (R26.89);Pain Pain - Right/Left: Left Pain - part of body: Knee    Time: 0905-0930 PT Time Calculation (min) (ACUTE ONLY): 25 min   Charges:   PT Evaluation $PT Eval Low Complexity: 1 Low PT Treatments $Gait Training: 8-22 mins PT General Charges $$ ACUTE PT VISIT: 1 Visit         [name] PT Acute Rehabilitation Services Pager [phone] Office [phone]   [name] 08/25/2023, 2:25 PM

## 2023-08-25 NOTE — Op Note (Signed)
 [name], [name] MEDICAL RECORD NO: [id number] ACCOUNT NO: [id number] DATE OF BIRTH: 06-Jul-1955 FACILITY: [facility]ATION: [id number] PHYSICIAN: Gus Rankin. Honora Searson, MD  Operative Report   DATE OF PROCEDURE: 08/24/2023  PREOPERATIVE DIAGNOSES: 1.  Left patellar tendon rupture. 2.  Left knee periprosthetic dislocation.  POSTOPERATIVE DIAGNOSES: 1.  Left patellar tendon rupture. 2.  Left knee periprosthetic dislocation.  PROCEDURE PERFORMED: 1.  Left patellar tendon repair. 2.  Left knee polyethylene revision.  SURGEON:  Gus Rankin. Jamiesha Victoria, MD  ASSISTANT:  Demetrius Revel, PA-C  ANESTHESIA:  Spinal and adductor canal block  ESTIMATED BLOOD LOSS:  50 mL.  DRAINS:  None.  TOURNIQUET TIME:  28 minutes at 300 mmHg.  COMPLICATIONS:  None.  CONDITION:  Stable to recovery.  BRIEF CLINICAL NOTE:  The patient is a 68 year old female who had a left total knee arthroplasty revision performed approximately 3 weeks ago.  She presented to the office yesterday with a marked increase in pain and inability to extend the knee.  Her  x-rays showed that she had patella alta as well as tibiofemoral subluxation.  It was felt that she ruptured her patellar tendon and the knee subluxed either causing it or secondary to the patellar tendon rupture.  She presents today for tendon repair and  possible polyethylene revision.  DESCRIPTION OF PROCEDURE:  After successful administration of adductor canal block and spinal anesthetic, a tourniquet was placed on her left thigh and left lower extremity prepped and draped in the usual sterile fashion.  Extremities were wrapped in  Esmarch and tourniquet inflated to 300 mmHg.  Previous incisions were utilized.  Skin covered with a 10 blade through the subcutaneous tissue.  Immediately upon going through the subcutaneous, we identified some hematoma and there was evidence of a  complete patellar tendon rupture.  It was unusual in that it was transverse and it was  right at the level of the joint line.  The tibia was also dislocated anteriorly in relation to the femur.  I washed the knee out with 1 liter of saline using pulsatile  lavage to evacuate the hematoma.  We then flexed the knee and subluxed the tibia forward removing the tibial polyethylene.  This was a 16 mm thickness.  I felt that we needed at least 2 more mm of thickness, so we placed an 18 mm size 4 Attune revision  rotating platform polyethylene into the tibial tray.  I reduced the knee and this led to a very stable reduction with full extension, excellent varus, valgus, and anterior and posterior imbalance throughout full range of motion.  We irrigated the knee  further.  We then took two #1 Ethibond sutures and weaved them through the medial and lateral aspect of the patellar tendon respectively.  I started at the inferior pole of the patella and in a Bunnell fashion, weaved each of the 2 sutures through the  tendon from proximal to distal and then we passed the sutures through the cuff of the remaining tendon, which was at least a 1 cm cuff off of the tibial tubercle.  We got a very stable fixation with this and then I tied the sutures and then tied them to  each other also.  I was able to flex the knee 90 degrees and the repair stayed intact.  I then took another #1 Ethibond and did a whip stitch on the medial border of the tendon going from proximal to distal across the repair site also and then another  one  laterally from proximal to distal across the repair site.  I further irrigated using a total of 2 Liter of saline with pulsatile lavage.  I closed the arthrotomy with a running 0 Stratafix suture and then we reinforced that with #1 Ethibond suture.   The small open area laterally was also closed with the Ethibond.  I flexed against gravity and got 90 degrees with a very stable repair.  The tourniquet was then released, total time of 28 minutes.  Subcutaneous tissue was then closed with  interrupted  2-0 Vicryl and subcuticular running 4-0 Monocryl.  The incision was cleaned and dried and a bulky sterile dressing applied.  She was placed into a knee immobilizer, awakened and transported to recovery in stable condition.  Note that the surgical assistance and medical necessity for this procedure for assistance in repair of the tendon as well as retraction of vital structures to allow access to the tendon.   PUS D: 08/24/2023 5:16:05 pm T: 08/25/2023 7:08:00 am  JOB: [id number]/ [id number]

## 2023-08-26 ENCOUNTER — Other Ambulatory Visit (HOSPITAL_COMMUNITY): Payer: Self-pay

## 2023-08-26 LAB — CBC
HCT: 33.5 % — ABNORMAL LOW (ref 36.0–46.0)
Hemoglobin: 10.5 g/dL — ABNORMAL LOW (ref 12.0–15.0)
MCH: 30.9 pg (ref 26.0–34.0)
MCHC: 31.3 g/dL (ref 30.0–36.0)
MCV: 98.5 fL (ref 80.0–100.0)
Platelets: 251 10*3/uL (ref 150–400)
RBC: 3.4 MIL/uL — ABNORMAL LOW (ref 3.87–5.11)
RDW: 13 % (ref 11.5–15.5)
WBC: 8.7 10*3/uL (ref 4.0–10.5)
nRBC: 0 % (ref 0.0–0.2)

## 2023-08-26 MED ORDER — HYDROMORPHONE HCL 2 MG PO TABS: 2.0000 mg | ORAL_TABLET | Freq: Four times a day (QID) | ORAL | 0 refills | Status: DC | PRN

## 2023-08-26 MED ORDER — TRAMADOL HCL 50 MG PO TABS
50.0000 mg | ORAL_TABLET | Freq: Four times a day (QID) | ORAL | 0 refills | Status: AC | PRN
  Filled 2023-08-26 (×2): qty 30, 4d supply, fill #0

## 2023-08-26 MED ORDER — HYDROMORPHONE HCL 2 MG PO TABS
2.0000 mg | ORAL_TABLET | Freq: Four times a day (QID) | ORAL | 0 refills | Status: DC | PRN
  Filled 2023-08-26 (×2): qty 42, 6d supply, fill #0

## 2023-08-26 MED ORDER — METHOCARBAMOL 500 MG PO TABS: 500.0000 mg | ORAL_TABLET | Freq: Four times a day (QID) | ORAL | 0 refills | Status: DC | PRN

## 2023-08-26 MED ORDER — TRAMADOL HCL 50 MG PO TABS
50.0000 mg | ORAL_TABLET | Freq: Four times a day (QID) | ORAL | 0 refills | Status: DC | PRN
Start: 2023-08-26 — End: 2023-08-26

## 2023-08-26 NOTE — Progress Notes (Signed)
   Subjective: 2 Days Post-Op Procedure(s) (LRB): REPAIR, TENDON, PATELLAR (Left) POLYETHYLENE REVISION (Left) Patient reports pain as moderate. States prior to rounds she was being assisted to the bathroom when her leg dropped off side of bed. Thankfully, KI was in place so no hyperflexion was able to occur.  Was not able to dc yesterday due to pain control.   Objective: Vital signs in last 24 hours: Temp:  [97.3 F (36.3 C)-97.8 F (36.6 C)] 97.3 F (36.3 C) (04/18 0533) Pulse Rate:  [85-89] 85 (04/18 0533) Resp:  [17-18] 17 (04/18 0533) BP: (109-139)/(63-95) 109/63 (04/18 0533) SpO2:  [98 %-100 %] 98 % (04/18 0533)  Intake/Output from previous day:  Intake/Output Summary (Last 24 hours) at 08/26/2023 0817 Last data filed at 08/26/2023 0222 Gross per 24 hour  Intake 927.8 ml  Output 1 ml  Net 926.8 ml    Intake/Output this shift: No intake/output data recorded.  Labs: Recent Labs    08/25/23 0328 08/26/23 0312  HGB 11.6* 10.5*   Recent Labs    08/25/23 0328 08/26/23 0312  WBC 6.3 8.7  RBC 3.75* 3.40*  HCT 35.9* 33.5*  PLT 277 251   Recent Labs    08/25/23 0328  NA 137  K 4.1  CL 101  CO2 26  BUN 16  CREATININE 0.79  GLUCOSE 148*  CALCIUM  8.4*   No results for input(s): LABPT, INR in the last 72 hours.  Exam: General - Patient is Alert and Oriented Extremity - Neurologically intact Neurovascular intact Sensation intact distally Dorsiflexion/Plantar flexion intact Dressing/Incision - clean, dry, no drainage. Bulky dressing removed. Minimal swelling. Able to actively extend from approximately 5-10 degrees of flexion. Motor Function - intact, moving foot and toes well on exam.   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   Weight as of this encounter: 70.8 kg. Up with therapy  DVT Prophylaxis - Aspirin  Weight-bearing as tolerated  Thankfully, it does not appear that anything was damaged when the leg dropped this morning.  Discharge to home later today if meeting goals with therapy and pain controlled.  Roxie Mess, PA-C Orthopedic Surgery [phone] 08/26/2023, 8:17 AM

## 2023-08-26 NOTE — Progress Notes (Signed)
Discharge package printed and instructions given to pt. Pt verbalizes understanding. 

## 2023-08-26 NOTE — Progress Notes (Signed)
 Orthopedic Tech Progress Note Patient Details:  [name] 1956-03-08 [id number]  Ortho Devices Type of Ortho Device: Crutches Ortho Device/Splint Interventions: Ordered, Adjustment      Mearl Spice Zakyla Tonche 08/26/2023, 10:31 AM

## 2023-08-26 NOTE — Plan of Care (Signed)
   Problem: Activity: Goal: Risk for activity intolerance will decrease Outcome: Progressing   Problem: Pain Managment: Goal: General experience of comfort will improve and/or be controlled Outcome: Progressing   Problem: Safety: Goal: Ability to remain free from injury will improve Outcome: Progressing

## 2023-08-26 NOTE — Progress Notes (Signed)
 Patient reviewed her labs from My Chart and had concerns with abnormal values, such as calcium  level. Patient stated that she is not getting enough calcium , and does not want to end up in the ED after discharge due to low calcium  level.

## 2023-08-26 NOTE — TOC Progression Note (Signed)
 Transition of Care [facility]) - Progression Note    Patient Details  Name: [name] MRN: [id number] Date of Birth: [birth year]  Transition of Care [facility]) CM/SW Contact  Sonda Manuella Quill, RN Phone Number: 08/26/2023, 11:53 AM  Clinical Narrative:    Orders received for HHPT/Aide, and standard manual wheelchair w/ seat cushion; spoke w/ pt in room; pt says she was told not to bend leg for a few weeks; clarified orders/instructions w/ Roxie Regan, PA ,  can begin PT on discharge, but will still have strict precautions for no ROM for 6 weeks'; pt notified;  [facility] says [facility]; she agreed to receive recc services; pt says she does not have agency preference for [facility] services; she gave d/c address: [address] [place], [place] [address]; spoke w/ Greig Cedar at [facility]; she says agency can  provide services; agency contact info placed in follow up provider section of d/c instructions; no TOC needs.     Barriers to Discharge: No Barriers Identified  Expected Discharge Plan and Services         Expected Discharge Date: 08/26/23               DME Arranged: N/A DME Agency: NA                   Social Determinants of Health (SDOH) Interventions SDOH Screenings   Food Insecurity: No Food Insecurity (08/24/2023)  Housing: Low Risk  (08/24/2023)  Transportation Needs: No Transportation Needs (08/24/2023)  Utilities: At Risk (08/24/2023)  Financial Resource Strain: Low Risk  (09/23/2022)   Received from [facility]  Social Connections: Socially Isolated (08/24/2023)  Tobacco Use: Low Risk  (08/24/2023)    Readmission Risk Interventions     No data to display

## 2023-08-26 NOTE — Progress Notes (Signed)
 Physical Therapy Treatment Patient Details Name: [name] MRN: [id number] DOB: 1955/12/10 Today's Date: 08/26/2023   History of Present Illness Pt s.p L patellar tendon repair and with hx of L tkr (22) with revision 07/27/23, bil THR and back injury    PT Comments  Pt continues very cooperative and progressing well with mobility.  Pt up to ambulate in hall, reviewed KI and negotiated stairs with bil crutches. Questions asked and answered.  Pt feeling more prepared for dc home this date.    If plan is discharge home, recommend the following: A little help with walking and/or transfers;A little help with bathing/dressing/bathroom;Assistance with cooking/housework;Assist for transportation;Help with stairs or ramp for entrance   Can travel by private vehicle        Equipment Recommendations  None recommended by PT    Recommendations for Other Services       Precautions / Restrictions Precautions Precautions: Fall Required Braces or Orthoses: Knee Immobilizer - Left Knee Immobilizer - Left: On at all times Restrictions Weight Bearing Restrictions Per Provider Order: Yes LLE Weight Bearing Per Provider Order: Weight bearing as tolerated Other Position/Activity Restrictions: with Ki in place     Mobility  Bed Mobility Overal bed mobility: Needs Assistance Bed Mobility: Supine to Sit     Supine to sit: Supervision     General bed mobility comments: Pt self assisting L LE with gait belt    Transfers Overall transfer level: Needs assistance Equipment used: Rolling walker (2 wheels) Transfers: Sit to/from Stand Sit to Stand: Min assist, Contact guard assist           General transfer comment: cues for LE management and use of UEs to self assist    Ambulation/Gait Ambulation/Gait assistance: Contact guard assist, Supervision Gait Distance (Feet): 60 Feet Assistive device: Rolling walker (2 wheels) Gait Pattern/deviations: Step-to pattern, Decreased step length  - right, Decreased step length - left, Shuffle, Trunk flexed Gait velocity: decr     General Gait Details: cues for sequence, posture and position from RW   Stairs Stairs: Yes Stairs assistance: Contact guard assist Stair Management: No rails, Step to pattern, Forwards, With crutches Number of Stairs: 3 General stair comments: Steady assist wtih cues for sequence and foot/crutch placement   Wheelchair Mobility     Tilt Bed    Modified Rankin (Stroke Patients Only)       Balance Overall balance assessment: Needs assistance Sitting-balance support: No upper extremity supported, Feet supported Sitting balance-Leahy Scale: Good     Standing balance support: No upper extremity supported Standing balance-Leahy Scale: Fair                              Hotel Manager: No apparent difficulties  Cognition Arousal: Alert Behavior During Therapy: WFL for tasks assessed/performed, Anxious   PT - Cognitive impairments: No apparent impairments                         Following commands: Intact      Cueing Cueing Techniques: Verbal cues  Exercises General Exercises - Lower Extremity Ankle Circles/Pumps: AROM, Both, 15 reps, Supine    General Comments        Pertinent Vitals/Pain Pain Assessment Pain Assessment: 0-10 Pain Score: 5  Pain Location: L knee Pain Descriptors / Indicators: Aching, Sore Pain Intervention(s): Limited activity within patient's tolerance, Monitored during session, Premedicated before session, Ice applied  Home Living                          Prior Function            PT Goals (current goals can now be found in the care plan section) Acute Rehab PT Goals Patient Stated Goal: regain independence PT Goal Formulation: With patient Time For Goal Achievement: 08/10/23 Potential to Achieve Goals: Good Progress towards PT goals: Progressing toward goals    Frequency     7X/week      PT Plan      Co-evaluation              AM-PAC PT 6 Clicks Mobility   Outcome Measure  Help needed turning from your back to your side while in a flat bed without using bedrails?: A Little Help needed moving from lying on your back to sitting on the side of a flat bed without using bedrails?: A Little Help needed moving to and from a bed to a chair (including a wheelchair)?: A Little Help needed standing up from a chair using your arms (e.g., wheelchair or bedside chair)?: A Little Help needed to walk in hospital room?: A Little Help needed climbing 3-5 steps with a railing? : A Little 6 Click Score: 18    End of Session Equipment Utilized During Treatment: Gait belt;Left knee immobilizer Activity Tolerance: Patient tolerated treatment well Patient left: in chair;with call bell/phone within reach Nurse Communication: Mobility status PT Visit Diagnosis: Other abnormalities of gait and mobility (R26.89);Pain Pain - Right/Left: Left Pain - part of body: Knee     Time: 1000-1028 PT Time Calculation (min) (ACUTE ONLY): 28 min  Charges:    $Gait Training: 8-22 mins $Therapeutic Activity: 8-22 mins PT General Charges $$ ACUTE PT VISIT: 1 Visit                     Katrinka Acton PT Acute Rehabilitation Services Pager [phone] Office [phone]    Carisha Kantor 08/26/2023, 12:21 PM

## 2023-08-26 NOTE — Progress Notes (Addendum)
 Pt called nurse to room wanted to file a complain; staff dropped her surgery leg this AM while trying to get her to the bathroom. Pt has KI on while getting up. Assessed pt, very minimal swell as expected, dressing intact dry and clean. No Nurse asked if the doctor in and aware of it yet. Patient states the PA was in already and she said it looks better than yesterday. Charge nurse notified.

## 2023-08-29 ENCOUNTER — Encounter (HOSPITAL_COMMUNITY): Payer: Self-pay | Admitting: Orthopedic Surgery

## 2023-08-29 NOTE — Discharge Summary (Signed)
 Patient ID: [name] MRN: [id number] DOB/AGE: 16-May-1955 68 y.o.  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 female who was admitted 08/24/2023 for operative treatment ofPatellar tendon rupture, left, initial encounter. Patient has severe unremitting pain that affects sleep, daily activities, and work/hobbies. After pre-op clearance the patient was taken to the operating room on 08/24/2023 and underwent  Procedure(s): REPAIR, TENDON, PATELLAR POLYETHYLENE REVISION.    Patient was given perioperative antibiotics:  Anti-infectives (From admission, onward)    Start     Dose/Rate Route Frequency Ordered Stop   08/25/23 0600  ceFAZolin  (ANCEF ) IVPB 2g/100 mL premix        2 g 200 mL/hr over 30 Minutes Intravenous On call to O.R. 08/24/23 1251 08/25/23 0630   08/24/23 2200  ceFAZolin  (ANCEF ) IVPB 2g/100 mL premix        2 g 200 mL/hr over 30 Minutes Intravenous Every 6 hours 08/24/23 2112 08/25/23 1734        Patient was given sequential compression devices, early ambulation, and chemoprophylaxis to prevent DVT.  Patient benefited maximally from hospital stay and there were no  complications.    Recent vital signs: No data found.   Recent laboratory studies: No results for input(s): WBC, HGB, HCT, PLT, NA, K, CL, CO2, BUN, CREATININE, GLUCOSE, INR, CALCIUM  in the last 72 hours.  Invalid input(s): PT, 2   Discharge Medications:   Allergies as of 08/26/2023       Reactions   Anesthetics, Ester Nausea And Vomiting   Lidocaine  Nausea And Vomiting   Other Cough   Refuse blood or blood products   Oxycodone -acetaminophen  Nausea And Vomiting, Rash   Sulfa Antibiotics Hives   Sulfasalazine Hives, Rash   Amlodipine Swelling   Codeine Hives, Nausea And Vomiting   Hydrocodone Nausea And Vomiting   Ivp Dye [iodinated Contrast Media] Hives   Lisinopril Itching   Can take split doses-takes 10 in am and 10 in evening   Oxycodone  Nausea And Vomiting   Prednisone  Swelling, Other (See Comments)   Causes her to have vaginal bleeding. Agitation         Medication List     TAKE these medications    acetaminophen  500 MG tablet Commonly known as: TYLENOL  Take 1,000 mg by mouth every 6 (six) hours.   albuterol  108 (90 Base) MCG/ACT inhaler Commonly known as: VENTOLIN  HFA Inhale 2-6 puffs into the lungs as needed for wheezing or shortness of breath.   Aspirin  Low Dose 81 MG chewable tablet Generic drug: aspirin  Chew 1 tablet (81 mg total) by  mouth 2 (two) times daily for 21 days. Then take one 81 mg aspirin  once a day for three weeks. Then discontinue aspirin .   baclofen 10 MG tablet Commonly known as: LIORESAL Take 10 mg by mouth at bedtime.   calcium  citrate 950 (200 Ca) MG tablet Commonly known as: CALCITRATE - dosed in mg elemental calcium  Take 1 tablet by mouth 2 (two) times daily.   chlorhexidine  4 % external liquid Commonly known as: HIBICLENS  Apply 15 mLs (1 Application total) topically as directed for 30 doses. Use as directed daily for 5 days every other week for 6 weeks.   cyanocobalamin  1000 MCG tablet Commonly  known as: VITAMIN B12 Take 1,000 mcg by mouth daily.   estradiol  0.05 MG/24HR patch Commonly known as: Vivelle -Dot Place 1 patch (0.05 mg total) onto the skin 2 (two) times a week.   Estradiol  10 MCG Tabs vaginal tablet Place 10 mcg vaginally daily.   fluticasone  50 MCG/ACT nasal spray Commonly known as: FLONASE  Place 2 sprays into both nostrils as needed for allergies or rhinitis.   gabapentin  100 MG capsule Commonly known as: NEURONTIN  Take 100 mg by mouth at bedtime.   HYDROmorphone  2 MG tablet Commonly known as: DILAUDID  Take 1-2 tablets (2-4 mg total) by mouth every 6 (six) hours as needed for severe pain (pain score 7-10).   lisinopril 10 MG tablet Commonly known as: ZESTRIL Take 10 mg by mouth in the morning and at bedtime.   methocarbamol  500 MG tablet Commonly known as: ROBAXIN  Take 1 tablet (500 mg total) by mouth every 6 (six) hours as needed for muscle spasms.   mupirocin  ointment 2 % Commonly known as: BACTROBAN  Place 1 Application into the nose 2 (two) times daily for 60 doses. Use as directed 2 times daily for 5 days every other week for 6 weeks.   NP Thyroid  15 MG tablet Generic drug: thyroid  Take 15 mg by mouth See admin instructions. Take with 90 mg for  total of 105 mg in the morning   NP Thyroid  90 MG tablet Generic drug: thyroid  Take 90 mg by mouth See admin instructions. Take with 15 mg for a total of 105 mg in the morning   phentermine  37.5 MG tablet Commonly known as: ADIPEX-P  Take 37.5 mg by mouth daily before breakfast.   promethazine  25 MG tablet Commonly known as: PHENERGAN  Take 1 tablet (25 mg total) by mouth every 6 (six) hours as needed for nausea or vomiting.   rOPINIRole  1 MG tablet Commonly known as: REQUIP  Take 2 mg by mouth See admin instructions. Take 1 mg in the morning and 2 mg at bedtime   traMADol  50 MG tablet Commonly known as: ULTRAM  Take 1-2 tablets (50-100 mg total) by mouth every 6 (six) hours as needed for moderate  pain (pain score 4-6).   valACYclovir  500 MG tablet Commonly known as: VALTREX  Take 500 mg by mouth daily.   VITAMIN B-3 PO Take 500 mg by mouth daily.   vitamin C 1000 MG tablet Take 1,000 mg by mouth daily.   Vitamin D3 1000 units Caps Take 1,000 Units by mouth daily.               Discharge Care Instructions  (From admission, onward)           Start     Ordered   08/25/23 0000  Weight bearing as tolerated        08/25/23 0757   08/25/23 0000  Change dressing  Comments: You may remove the bulky bandage (ACE wrap and gauze) two days after surgery. You will have an adhesive waterproof bandage underneath. Leave this in place until your first follow-up appointment.   08/25/23 0757            Diagnostic Studies: CT Head Wo Contrast Result Date: 08/04/2023 CLINICAL DATA:  Recent fall with headaches and neck pain, initial encounter EXAM: CT HEAD WITHOUT CONTRAST CT CERVICAL SPINE WITHOUT CONTRAST TECHNIQUE: Multidetector CT imaging of the head and cervical spine was performed following the standard protocol without intravenous contrast. Multiplanar CT image reconstructions of the cervical spine were also generated. RADIATION DOSE REDUCTION: This exam was performed according to the departmental dose-optimization program which includes automated exposure control, adjustment of the mA and/or kV according to patient size and/or use of iterative reconstruction technique. COMPARISON:  None Available. FINDINGS: CT HEAD FINDINGS Brain: No evidence of acute infarction, hemorrhage, hydrocephalus, extra-axial collection or mass lesion/mass effect. Vascular: No hyperdense vessel or unexpected calcification. Skull: Normal. Negative for fracture or focal lesion. Sinuses/Orbits: No acute finding. Other: None. CT CERVICAL SPINE FINDINGS Alignment: Straightening of the normal cervical lordosis is noted. Vertebral body height is well maintained. Skull base and vertebrae: 7 cervical segments  are well visualized. Multilevel postsurgical changes are seen involving C3-C6 consistent with prior partial laminectomy on the right. Multilevel osteophytic changes are seen. Facet hypertrophic changes are noted as well. No acute fracture or acute facet abnormality is noted. The odontoid is within normal limits. Soft tissues and spinal canal: Surrounding soft tissue structures show no acute abnormality. Upper chest: Visualized lung apices are within normal limits. Other: None IMPRESSION: CT of the head: No acute intracranial abnormality noted. CT of the cervical spine: Multilevel degenerative change and postoperative change as described. No acute bony abnormality is noted. Electronically Signed   By: Oneil Devonshire M.D.   On: 08/04/2023 20:11   CT Cervical Spine Wo Contrast Result Date: 08/04/2023 CLINICAL DATA:  Recent fall with headaches and neck pain, initial encounter EXAM: CT HEAD WITHOUT CONTRAST CT CERVICAL SPINE WITHOUT CONTRAST TECHNIQUE: Multidetector CT imaging of the head and cervical spine was performed following the standard protocol without intravenous contrast. Multiplanar CT image reconstructions of the cervical spine were also generated. RADIATION DOSE REDUCTION: This exam was performed according to the departmental dose-optimization program which includes automated exposure control, adjustment of the mA and/or kV according to patient size and/or use of iterative reconstruction technique. COMPARISON:  None Available. FINDINGS: CT HEAD FINDINGS Brain: No evidence of acute infarction, hemorrhage, hydrocephalus, extra-axial collection or mass lesion/mass effect. Vascular: No hyperdense vessel or unexpected calcification. Skull: Normal. Negative for fracture or focal lesion. Sinuses/Orbits: No acute finding. Other: None. CT CERVICAL SPINE FINDINGS Alignment: Straightening of the normal cervical lordosis is noted. Vertebral body height is well maintained. Skull base and vertebrae: 7 cervical segments  are well visualized. Multilevel postsurgical changes are seen involving C3-C6 consistent with prior partial laminectomy on the right. Multilevel osteophytic changes are seen. Facet hypertrophic changes are noted as well. No acute fracture or acute facet abnormality is noted. The odontoid is within normal limits. Soft tissues and spinal canal: Surrounding soft tissue structures show no acute abnormality. Upper chest: Visualized lung apices are within normal limits. Other: None IMPRESSION: CT of the head: No acute intracranial abnormality noted. CT of the cervical spine: Multilevel degenerative change and postoperative change as described. No acute bony abnormality is noted. Electronically Signed   By: Oneil Devonshire M.D.   On:  08/04/2023 20:11   DG Femur Min 2 Views Left Result Date: 08/04/2023 CLINICAL DATA:  Recent left knee replacement with history of fall today and leg pain, initial encounter EXAM: LEFT FEMUR 2 VIEWS COMPARISON:  None Available. FINDINGS: Left hip and knee replacements are noted. No acute fracture or dislocation is noted. Soft tissue swelling is noted about the knee joint consistent with the recent surgery. No new focal abnormality is seen. IMPRESSION: Soft tissue swelling about the knee consistent with the recent surgery. No other focal abnormality is noted. Electronically Signed   By: Oneil Devonshire M.D.   On: 08/04/2023 20:06   DG Knee 2 Views Left Result Date: 08/04/2023 CLINICAL DATA:  History of recent left knee replacement with fall and leg pain, initial encounter EXAM: LEFT KNEE - 1-2 VIEW COMPARISON:  None Available. FINDINGS: Left knee prosthesis is noted. No acute fracture or dislocation is seen. Soft tissue swelling is noted anteriorly consistent with the recent surgery. Some fluid is noted within the joint also consistent with the recent surgery. IMPRESSION: Postsurgical changes with residual joint effusion and soft tissue swelling. No acute bony abnormality is noted.  Electronically Signed   By: Oneil Devonshire M.D.   On: 08/04/2023 20:04    Disposition: Discharge disposition: 01-Home or Self Care       Discharge Instructions     Call MD / Call 911   Complete by: As directed    If you experience chest pain or shortness of breath, CALL 911 and be transported to the hospital emergency room.  If you develope a fever above 101 F, pus (white drainage) or increased drainage or redness at the wound, or calf pain, call your surgeon's office.   Change dressing   Complete by: As directed    You may remove the bulky bandage (ACE wrap and gauze) two days after surgery. You will have an adhesive waterproof bandage underneath. Leave this in place until your first follow-up appointment.   Constipation Prevention   Complete by: As directed    Drink plenty of fluids.  Prune juice may be helpful.  You may use a stool softener, such as Colace (over the counter) 100 mg twice a day.  Use MiraLax  (over the counter) for constipation as needed.   Diet - low sodium heart healthy   Complete by: As directed    Do not put a pillow under the knee. Place it under the heel.   Complete by: As directed    Driving restrictions   Complete by: As directed    No driving for two weeks   Post-operative opioid taper instructions:   Complete by: As directed    POST-OPERATIVE OPIOID TAPER INSTRUCTIONS: It is important to wean off of your opioid medication as soon as possible. If you do not need pain medication after your surgery it is ok to stop day one. Opioids include: Codeine, Hydrocodone(Norco, Vicodin), Oxycodone (Percocet, oxycontin ) and hydromorphone  amongst others.  Long term and even short term use of opiods can cause: Increased pain response Dependence Constipation Depression Respiratory depression And more.  Withdrawal symptoms can include Flu like symptoms Nausea, vomiting And more Techniques to manage these symptoms Hydrate well Eat regular healthy meals Stay  active Use relaxation techniques(deep breathing, meditating, yoga) Do Not substitute Alcohol to help with tapering If you have been on opioids for less than two weeks and do not have pain than it is ok to stop all together.  Plan to wean off of opioids This plan should  start within one week post op of your joint replacement. Maintain the same interval or time between taking each dose and first decrease the dose.  Cut the total daily intake of opioids by one tablet each day Next start to increase the time between doses. The last dose that should be eliminated is the evening dose.      TED hose   Complete by: As directed    Use stockings (TED hose) for three weeks on both leg(s).  You may remove them at night for sleeping.   Weight bearing as tolerated   Complete by: As directed         Follow-up Information     Aluisio, Dempsey, MD. Schedule an appointment as soon as possible for a visit in 2 week(s).   Specialty: Orthopedic Surgery Contact information: [address] [place] [address] [phone]         [facility] Follow up.   Contact information: [address] [place] [address] [phone]                  Signed: Roxie Mess 08/29/2023, 11:26 AM

## 2023-10-06 ENCOUNTER — Other Ambulatory Visit (HOSPITAL_COMMUNITY): Payer: Self-pay

## 2023-10-24 ENCOUNTER — Encounter: Payer: Self-pay | Admitting: Obstetrics & Gynecology

## 2023-10-26 ENCOUNTER — Telehealth: Payer: Self-pay | Admitting: *Deleted

## 2023-10-26 NOTE — Telephone Encounter (Signed)
 [facility] have [facility] for [facility] before scheduling.

## 2023-11-01 ENCOUNTER — Other Ambulatory Visit (HOSPITAL_COMMUNITY): Payer: Self-pay

## 2023-11-04 ENCOUNTER — Encounter (HOSPITAL_COMMUNITY): Payer: Self-pay

## 2023-11-04 ENCOUNTER — Emergency Department (HOSPITAL_COMMUNITY)

## 2023-11-04 ENCOUNTER — Other Ambulatory Visit: Payer: Self-pay

## 2023-11-04 ENCOUNTER — Emergency Department (HOSPITAL_COMMUNITY)
Admission: EM | Admit: 2023-11-04 | Discharge: 2023-11-04 | Disposition: A | Discharge status: Home / Self Care | Attending: Emergency Medicine | Admitting: Emergency Medicine

## 2023-11-04 DIAGNOSIS — M25562 Pain in left knee: Secondary | ICD-10-CM | POA: Insufficient documentation

## 2023-11-04 DIAGNOSIS — E039 Hypothyroidism, unspecified: Secondary | ICD-10-CM | POA: Insufficient documentation

## 2023-11-04 DIAGNOSIS — J45909 Unspecified asthma, uncomplicated: Secondary | ICD-10-CM | POA: Insufficient documentation

## 2023-11-04 DIAGNOSIS — X509XXA Other and unspecified overexertion or strenuous movements or postures, initial encounter: Secondary | ICD-10-CM | POA: Diagnosis not present

## 2023-11-04 DIAGNOSIS — Z8541 Personal history of malignant neoplasm of cervix uteri: Secondary | ICD-10-CM | POA: Diagnosis not present

## 2023-11-04 DIAGNOSIS — I10 Essential (primary) hypertension: Secondary | ICD-10-CM | POA: Diagnosis not present

## 2023-11-04 DIAGNOSIS — Z7951 Long term (current) use of inhaled steroids: Secondary | ICD-10-CM | POA: Insufficient documentation

## 2023-11-04 DIAGNOSIS — Z79899 Other long term (current) drug therapy: Secondary | ICD-10-CM | POA: Insufficient documentation

## 2023-11-04 DIAGNOSIS — Z96649 Presence of unspecified artificial hip joint: Secondary | ICD-10-CM | POA: Diagnosis not present

## 2023-11-04 MED ORDER — LORAZEPAM 0.5 MG PO TABS: 0.5000 mg | ORAL_TABLET | Freq: Once | ORAL | Status: DC

## 2023-11-04 MED ORDER — HYDROMORPHONE HCL 1 MG/ML IJ SOLN
0.5000 mg | Freq: Once | INTRAMUSCULAR | Status: AC
  Administered 2023-11-04: 0.5 mg via INTRAMUSCULAR
  Filled 2023-11-04: qty 1

## 2023-11-04 NOTE — Discharge Instructions (Addendum)
 As we discussed, your workup in the ER today was reassuring for acute findings.  X-ray and MRI imaging of your knee did not reveal any obvious injuries.  We discussed your case with our orthopedic surgeon on-call from your surgeons practice who reviewed the images and did not see an obvious cause of your pain.  They recommended that you wear your knee immobilizer and use crutches to get around and then follow-up with your surgeon first thing next week. Please keep your knee as straight as you can.  Please call your surgeon to schedule a close follow-up appointment.  In the interim, I recommend that you rest, ice, compress, and elevate your leg and take Tylenol /ibuprofen as needed for pain.  You can also take the tramadol  you already have for breakthrough pain.  Do not drive or operate heavy machinery while taking this medication.  Call your PCP to schedule a follow-up appointment as well.  Return if development of any new or worsening symptoms.

## 2023-11-04 NOTE — ED Provider Notes (Signed)
 [place] EMERGENCY DEPARTMENT AT [facility] Provider Note   CSN: [id number] Arrival date & time: 11/04/23  1447     Patient presents with: Knee Injury   [name] is a 68 y.o. female.   Patient with history of hypertension presents today with complaints of left knee injury. She states that same occurred earlier today after she was driving her truck. States that she went to step out of the truck and suddenly could not bear weight on her leg. Denies any trauma, did not feel any sort of popping sensation. States that she is unable to bear any weight on her leg and is not able to straighten her leg out all the way. Notes it feels like something is not in the right place. Hx of knee replacement in March, states that she also had a tendon repair after this but has been doing generally well until today. Does note that the knee is swollen at baseline but is more swollen since this injury occurred. She called her surgeon who recommended she come here for evaluation.  The history is provided by the patient. No language interpreter was used.       Prior to Admission medications   Medication Sig Start Date End Date Taking? Authorizing Provider  acetaminophen  (TYLENOL ) 500 MG tablet Take 1,000 mg by mouth every 6 (six) hours.    [provider]  albuterol  (VENTOLIN  HFA) 108 (90 Base) MCG/ACT inhaler Inhale 2-6 puffs into the lungs as needed for wheezing or shortness of breath.    [provider]  Ascorbic Acid (VITAMIN C) 1000 MG tablet Take 1,000 mg by mouth daily.    [provider]  baclofen (LIORESAL) 10 MG tablet Take 10 mg by mouth at bedtime. 08/21/19   [provider]  calcium  citrate (CALCITRATE - DOSED IN MG ELEMENTAL CALCIUM ) 950 (200 Ca) MG tablet Take 1 tablet by mouth 2 (two) times daily. 04/18/19   [provider]  chlorhexidine  (HIBICLENS ) 4 % external liquid Apply 15 mLs (1 Application total) topically as directed for  30 doses. Use as directed daily for 5 days every other week for 6 weeks. 08/24/23   Kristian Stabs, PA  Cholecalciferol (VITAMIN D3) 1000 units CAPS Take 1,000 Units by mouth daily.    [provider]  estradiol  (VIVELLE -DOT) 0.05 MG/24HR patch Place 1 patch (0.05 mg total) onto the skin 2 (two) times a week. 10/25/22   Cris Burnard DEL, MD  Estradiol  10 MCG TABS vaginal tablet Place 10 mcg vaginally daily.    [provider]  fluticasone  (FLONASE ) 50 MCG/ACT nasal spray Place 2 sprays into both nostrils as needed for allergies or rhinitis.    [provider]  gabapentin  (NEURONTIN ) 100 MG capsule Take 100 mg by mouth at bedtime.    [provider]  HYDROmorphone  (DILAUDID ) 2 MG tablet Take 1-2 tablets (2-4 mg total) by mouth every 6 (six) hours as needed for severe pain (pain score 7-10). 08/26/23   Edmisten, Kristie L, PA  lisinopril (ZESTRIL) 10 MG tablet Take 10 mg by mouth in the morning and at bedtime.    [provider]  methocarbamol  (ROBAXIN ) 500 MG tablet Take 1 tablet (500 mg total) by mouth every 6 (six) hours as needed for muscle spasms. 08/26/23   Edmisten, Kristie L, PA  Niacin (VITAMIN B-3 PO) Take 500 mg by mouth daily.    [provider]  phentermine  (ADIPEX-P ) 37.5 MG tablet Take 37.5 mg by mouth daily  before breakfast.    [provider]  promethazine  (PHENERGAN ) 25 MG tablet Take 1 tablet (25 mg total) by mouth every 6 (six) hours as needed for nausea or vomiting. 07/29/23   Edmisten, Kristie L, PA  rOPINIRole  (REQUIP ) 1 MG tablet Take 2 mg by mouth See admin instructions. Take 1 mg in the morning and 2 mg at bedtime    [provider]  thyroid  (NP THYROID ) 15 MG tablet Take 15 mg by mouth See admin instructions. Take with 90 mg for  total of 105 mg in the morning    [provider]  thyroid  (NP THYROID ) 90 MG tablet Take 90 mg by mouth See admin instructions. Take with 15 mg for a total of 105 mg in the  morning    [provider]  traMADol  (ULTRAM ) 50 MG tablet Take 1-2 tablets (50-100 mg total) by mouth every 6 (six) hours as needed for moderate pain (pain score 4-6). 08/26/23   Edmisten, Kristie L, PA  valACYclovir  (VALTREX ) 500 MG tablet Take 500 mg by mouth daily.    [provider]  vitamin B-12 (CYANOCOBALAMIN ) 1000 MCG tablet Take 1,000 mcg by mouth daily.    [provider]    Allergies: Anesthetics, ester; Lidocaine ; Other; Oxycodone -acetaminophen ; Sulfa antibiotics; Sulfasalazine; Amlodipine; Codeine; Hydrocodone; Ivp dye [iodinated contrast media]; Lisinopril; Oxycodone ; and Prednisone     Review of Systems  Musculoskeletal:  Positive for arthralgias and myalgias.  All other systems reviewed and are negative.   Updated Vital Signs BP (!) 144/108 (BP Location: Right Arm)   Pulse (!) 114   Temp 98.2 F (36.8 C) (Oral)   Resp 18   Ht 5' (1.524 m)   Wt 70.3 kg   SpO2 100%   BMI 30.27 kg/m   Physical Exam Vitals and nursing note reviewed.  Constitutional:      General: She is not in acute distress.    Appearance: Normal appearance. She is normal weight. She is not ill-appearing, toxic-appearing or diaphoretic.  HENT:     Head: Normocephalic and atraumatic.   Cardiovascular:     Rate and Rhythm: Normal rate.  Pulmonary:     Effort: Pulmonary effort is normal. No respiratory distress.   Musculoskeletal:        General: Normal range of motion.     Cervical back: Normal range of motion.     Comments: Left anterior knee with swelling present, unable to fully extend her knee, can perform some extension but is limited. No erythema or warmth. DP and PT pulses intact and 2+   Skin:    General: Skin is warm and dry.   Neurological:     General: No focal deficit present.     Mental Status: She is alert.   Psychiatric:        Mood and Affect: Mood normal.        Behavior: Behavior normal.     (all labs ordered are listed, but only abnormal  results are displayed) Labs Reviewed - No data to display  EKG: None  Radiology: MR KNEE LEFT WO CONTRAST Result Date: 11/04/2023 CLINICAL DATA:  Knee replacement, instability suspected EXAM: MRI OF THE LEFT KNEE WITHOUT CONTRAST TECHNIQUE: Multiplanar, multisequence MR imaging of the knee was performed. No intravenous contrast was administered. COMPARISON:  Same day radiographs of the left knee dated 11/04/2023. FINDINGS: Evaluation is significantly compromised due to susceptibility artifact from indwelling orthopedic hardware which limits evaluation of the surrounding bone and soft tissues. 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 No definite abnormal fluid signal surrounding the visualized prosthetic components. Alignment is not well evaluated on this exam, although, anterior translation of the tibial prosthesis relative to the femoral component cannot be excluded. Quadriceps tendon appears intact. Susceptibility artifact at the level of the distal patellar tendon insertion likely relates to prior surgery. Mild edema of the distal semimembranosus muscle with suspected bursitis. Small to moderate knee joint effusion. IMPRESSION: 1. Evaluation is significantly compromised due to susceptibility artifact from indwelling total knee arthroplasty which limits evaluation of the surrounding bone and soft tissues. No definite abnormal fluid signal surrounding the visualized prosthetic components. Alignment is not well evaluated on this exam, although, anterior translation of the tibial prosthesis relative to the femoral component cannot be excluded. 2. Mild edema of the distal semimembranosus muscle with suspected bursitis at the distal insertion. 3. Small to moderate joint effusion. Electronically Signed   By: Harrietta Sherry M.D.   On: 11/04/2023 19:50   DG Knee Complete 4 Views Left Result Date: 11/04/2023 CLINICAL DATA:  Concern for  dislocation EXAM: LEFT KNEE - COMPLETE 4+ VIEW COMPARISON:  08/04/2023 FINDINGS: Total knee arthroplasty. Diffuse anterior soft tissue swelling. Poor evaluation for joint effusion. No acute fracture or dislocation. IMPRESSION: Total knee arthroplasty, without acute complication. Anterior soft tissue swelling. Electronically Signed   By: Rockey Kilts M.D.   On: 11/04/2023 16:02     Procedures   Medications Ordered in the ED  LORazepam  (ATIVAN ) tablet 0.5 mg (0.5 mg Oral Not Given 11/04/23 1631)  HYDROmorphone  (DILAUDID ) injection 0.5 mg (0.5 mg Intramuscular Given 11/04/23 1914)                                    Medical Decision Making Amount and/or Complexity of Data Reviewed Radiology: ordered.  Risk Prescription drug management.   This patient is a 68 y.o. female who presents to the ED for concern of left knee pain, this involves an extensive number of treatment options, and is a complaint that carries with it a high risk of complications and morbidity. The emergent differential diagnosis prior to evaluation includes, but is not limited to,  trauma, post op complication, septic arthritis, bursitis, hemarthrosis . This is not an exhaustive differential.   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  Additional history: Chart reviewed. Pertinent results include: patient had knee replacement with Dr. Melodi with [facility] on 4/17  Physical Exam: Physical exam performed. The pertinent findings include: left knee with swelling, no erythema or warmth. Good distal  pulses and sensation. No obvious deformity. Compartments soft. Able to fully flex the knee, some extension but significant pain and inability to fully extend to knee.   Imaging Studies: I ordered imaging studies including DG left knee, MRI left knee. I independently visualized and interpreted imaging which showed   DG:  Total knee arthroplasty, without acute complication. Anterior soft tissue swelling.  MRI: 1. Evaluation is significantly compromised due to susceptibility artifact from indwelling total knee arthroplasty which limits evaluation of the surrounding bone and soft tissues. No definite abnormal  fluid signal surrounding the visualized prosthetic components. Alignment is not well evaluated on this exam, although, anterior translation of the tibial prosthesis relative to the femoral component cannot be excluded. 2. Mild edema of the distal semimembranosus muscle with suspected bursitis at the distal insertion. 3. Small to moderate joint effusion.   I agree with the radiologist interpretation.   Medications: I ordered medication including IM dilaudid   for pain. Reevaluation of the patient after these medicines showed that the patient improved. I have reviewed the patients home medicines and have made adjustments as needed.  Consultations Obtained: I requested consultation with the [facility] surgeon on call Dr. Kay,  and discussed lab and imaging findings as well as pertinent plan - they recommend: knee immobilizer, crutches, close outpatient orthopedic follow-up next week   Disposition: After consideration of the diagnostic results and the patients response to treatment, I feel that emergency department workup does not suggest an emergent condition requiring admission or immediate intervention beyond what has been performed at this time. The plan is: discharge with close outpatient orthopedics follow-up and return precautions per orthopedics recommendations. Patient has her post op  knee immobilizer, will wear this until she can follow-up. She has crutches for mobility as well.  Offered pain medication, however she states she still has tramadol  from her surgery she can take. Evaluation and diagnostic testing in the emergency department does not suggest an emergent condition requiring admission or immediate intervention beyond what has been performed at this time.  Plan for discharge with close PCP follow-up.  Patient is understanding and amenable with plan, educated on red flag symptoms that would prompt immediate return.  Patient discharged in stable condition.  Final diagnoses:  Acute pain of left knee    ED Discharge Orders     None     An After Visit Summary was printed and given to the patient.      Nora Lauraine DELENA DEVONNA 11/04/23 2057    Doretha Folks, MD 11/15/23 [phone]

## 2023-11-04 NOTE — ED Triage Notes (Signed)
 Patient presented to ER with concerns for dislocated knee. Patient states her knee popped while driving and now she cannot straighten it. Patient states it feels like something is stuck in her knee making her unable to straighten it.

## 2023-11-24 ENCOUNTER — Other Ambulatory Visit (HOSPITAL_COMMUNITY): Payer: Self-pay

## 2024-01-04 ENCOUNTER — Telehealth: Payer: Self-pay | Admitting: *Deleted

## 2024-01-04 NOTE — Telephone Encounter (Signed)
 Patient called for date of Hyst surgery.

## 2024-01-16 ENCOUNTER — Ambulatory Visit: Admitting: Obstetrics & Gynecology

## 2024-01-18 ENCOUNTER — Encounter (HOSPITAL_COMMUNITY)

## 2024-01-30 ENCOUNTER — Inpatient Hospital Stay (HOSPITAL_COMMUNITY): Admit: 2024-01-30 | Admitting: Orthopedic Surgery

## 2024-01-30 SURGERY — REVISION, TOTAL ARTHROPLASTY, KNEE
Anesthesia: Choice | Site: Knee | Laterality: Left

## 2024-02-13 ENCOUNTER — Other Ambulatory Visit (HOSPITAL_COMMUNITY)
Admission: RE | Admit: 2024-02-13 | Discharge: 2024-02-13 | Disposition: A | Source: Ambulatory Visit | Attending: Obstetrics & Gynecology | Admitting: Obstetrics & Gynecology

## 2024-02-13 ENCOUNTER — Ambulatory Visit (INDEPENDENT_AMBULATORY_CARE_PROVIDER_SITE_OTHER): Admitting: Obstetrics & Gynecology

## 2024-02-13 ENCOUNTER — Encounter: Payer: Self-pay | Admitting: Obstetrics & Gynecology

## 2024-02-13 VITALS — BP 111/77 | HR 85 | Ht 60.0 in | Wt 152.0 lb

## 2024-02-13 DIAGNOSIS — R87620 Atypical squamous cells of undetermined significance on cytologic smear of vagina (ASC-US): Secondary | ICD-10-CM | POA: Diagnosis not present

## 2024-02-13 DIAGNOSIS — Z01419 Encounter for gynecological examination (general) (routine) without abnormal findings: Secondary | ICD-10-CM

## 2024-02-13 DIAGNOSIS — R87811 Vaginal high risk human papillomavirus (HPV) DNA test positive: Secondary | ICD-10-CM

## 2024-02-13 NOTE — Progress Notes (Signed)
 Last pap smear (date and result):10/25/2022 ASC-US , High Risk HPV positive Last mammogram (date and result):05/16/2019 BI-RADS Category 1-Negative Last colon screening (date and result):Pt reports has had and does not want any future colonoscopies

## 2024-02-13 NOTE — Progress Notes (Signed)
   Subjective:    Patient ID: [name], female    DOB: [birth year], 68 y.o.   MRN: [id number]  HPI  Pt here for a pap smear.  Pt gets wellness care at [facility].      Indications:  2017--Nml Pap, HPV negative 2018--Low grade, +HPV 2018--ECC Low grade on colposcopy 2018--LEEP low grade with negative ECC after LEEP 2019--Nml cytology, +HPV 2020--High Grade pap, negative HPV 2021--colpo-biopsy negative, negative ECC March 2021--LSIL, HPV negative April 2021--LEEP--CIN 11 Mar 2020--[facility]--CERVIX NEGATIVE FOR DYSPLASIA May 2023--LSIL vaginal cuff December 2023--ASCUS, HPV negative June 2024--ASCU HPV + July 2024-colpo negative; no biopsies.     Review of Systems  Constitutional: Negative.   Respiratory: Negative.    Cardiovascular: Negative.   Gastrointestinal: Negative.   Genitourinary: Negative.  Negative for vaginal bleeding, vaginal discharge and vaginal pain.  Musculoskeletal:  Positive for arthralgias.       Objective:   Physical Exam Vitals reviewed.  Constitutional:      General: She is not in acute distress.    Appearance: She is well-developed.  HENT:     Head: Normocephalic and atraumatic.  Eyes:     Conjunctiva/sclera: Conjunctivae normal.  Cardiovascular:     Rate and Rhythm: Normal rate.  Pulmonary:     Effort: Pulmonary effort is normal.  Genitourinary:    Comments: Tanner V Vulva:  No lesion Vagina:  pale pink, no lesions, no discharge, no blood Cervix:  surgically absent Uterus:  surgically absent Right adnexa--non tender, no mass Left adnexa--non tender, no mass Skin:    General: Skin is warm and dry.  Neurological:     Mental Status: She is alert and oriented to person, place, and time.  Psychiatric:        Mood and Affect: Mood normal.    Vitals:   02/13/24 1443  BP: 111/77  Pulse: 85  Weight: 152 lb (68.9 kg)  Height: 5' (1.524 m)      Assessment & Plan:  68 yo female for rpt pap smear and  pelvic exam  Pap with cotesting (vaginal smear only) Declines Beast exam Declines mammogram and colon cancer screening.

## 2024-02-16 LAB — CYTOLOGY - PAP
Adequacy: ABSENT
Comment: NEGATIVE
Diagnosis: UNDETERMINED — AB
High risk HPV: NEGATIVE

## 2024-02-19 ENCOUNTER — Encounter: Payer: Self-pay | Admitting: Obstetrics & Gynecology

## 2024-03-09 ENCOUNTER — Encounter: Payer: Self-pay | Admitting: Obstetrics & Gynecology

## 2024-03-11 ENCOUNTER — Ambulatory Visit: Payer: Self-pay | Admitting: Obstetrics & Gynecology
